# Patient Record
Sex: Female | Born: 1975 | Race: White | Hispanic: No | Marital: Single
Health system: Southern US, Academic
[De-identification: ages and names within clinical notes are randomized; demographics above are authoritative.]

## PROBLEM LIST (undated history)

## (undated) ENCOUNTER — Ambulatory Visit

## (undated) ENCOUNTER — Encounter

## (undated) ENCOUNTER — Encounter: Payer: BLUE CROSS/BLUE SHIELD | Attending: Dermatology | Primary: Dermatology

## (undated) ENCOUNTER — Ambulatory Visit: Payer: BLUE CROSS/BLUE SHIELD | Attending: Obstetrics & Gynecology | Primary: Obstetrics & Gynecology

## (undated) ENCOUNTER — Encounter
Attending: Student in an Organized Health Care Education/Training Program | Primary: Student in an Organized Health Care Education/Training Program

## (undated) ENCOUNTER — Telehealth
Attending: Student in an Organized Health Care Education/Training Program | Primary: Student in an Organized Health Care Education/Training Program

## (undated) ENCOUNTER — Telehealth: Attending: Family | Primary: Family

## (undated) ENCOUNTER — Ambulatory Visit: Payer: Medicaid (Managed Care)

## (undated) ENCOUNTER — Telehealth

## (undated) ENCOUNTER — Telehealth: Attending: Obstetrics & Gynecology | Primary: Obstetrics & Gynecology

## (undated) ENCOUNTER — Ambulatory Visit: Payer: BLUE CROSS/BLUE SHIELD

## (undated) ENCOUNTER — Encounter: Attending: Obstetrics & Gynecology | Primary: Obstetrics & Gynecology

## (undated) ENCOUNTER — Ambulatory Visit
Attending: Student in an Organized Health Care Education/Training Program | Primary: Student in an Organized Health Care Education/Training Program

## (undated) ENCOUNTER — Inpatient Hospital Stay: Payer: BLUE CROSS/BLUE SHIELD

## (undated) ENCOUNTER — Encounter
Payer: BLUE CROSS/BLUE SHIELD | Attending: Student in an Organized Health Care Education/Training Program | Primary: Student in an Organized Health Care Education/Training Program

## (undated) DIAGNOSIS — E119 Type 2 diabetes mellitus without complications: Secondary | ICD-10-CM

## (undated) DIAGNOSIS — I10 Essential (primary) hypertension: Secondary | ICD-10-CM

## (undated) DIAGNOSIS — F329 Major depressive disorder, single episode, unspecified: Secondary | ICD-10-CM

## (undated) DIAGNOSIS — B20 Human immunodeficiency virus [HIV] disease: Secondary | ICD-10-CM

## (undated) DIAGNOSIS — Z21 Asymptomatic human immunodeficiency virus [HIV] infection status: Secondary | ICD-10-CM

## (undated) DIAGNOSIS — F32A Depression, unspecified: Secondary | ICD-10-CM

## (undated) DIAGNOSIS — F419 Anxiety disorder, unspecified: Secondary | ICD-10-CM

## (undated) DIAGNOSIS — K219 Gastro-esophageal reflux disease without esophagitis: Secondary | ICD-10-CM

## (undated) DIAGNOSIS — L409 Psoriasis, unspecified: Secondary | ICD-10-CM

## (undated) HISTORY — DX: Psoriasis, unspecified: L40.9

## (undated) HISTORY — PX: DILATION AND CURETTAGE OF UTERUS: SHX78

## (undated) HISTORY — DX: Human immunodeficiency virus (HIV) disease: B20

## (undated) HISTORY — DX: Anxiety disorder, unspecified: F41.9

## (undated) HISTORY — DX: Depression, unspecified: F32.A

## (undated) HISTORY — DX: Type 2 diabetes mellitus without complications: E11.9

## (undated) HISTORY — PX: CHOLECYSTECTOMY: SHX55

## (undated) HISTORY — PX: MOUTH SURGERY: SHX715

## (undated) HISTORY — DX: Asymptomatic human immunodeficiency virus (hiv) infection status: Z21

## (undated) HISTORY — DX: Gastro-esophageal reflux disease without esophagitis: K21.9

## (undated) HISTORY — DX: Essential (primary) hypertension: I10

## (undated) HISTORY — DX: Major depressive disorder, single episode, unspecified: F32.9

---

## 2013-02-01 DIAGNOSIS — G2581 Restless legs syndrome: Secondary | ICD-10-CM | POA: Insufficient documentation

## 2013-02-01 DIAGNOSIS — G4733 Obstructive sleep apnea (adult) (pediatric): Secondary | ICD-10-CM | POA: Insufficient documentation

## 2014-03-02 ENCOUNTER — Telehealth: Payer: Self-pay | Admitting: Family Medicine

## 2014-03-02 NOTE — Telephone Encounter (Signed)
Patient just moved here about 54mths she has medicaid and is currently taking metformin, lisinopril, omeprazole, celexa, valium 2mg  and meds for HIV. Patient is just needing a family dr here and will continue to see her infectious disease dr at [facility]. Her infectious disease dr prescribes the valium also. Appointment given for 3/24 with [name], [place].

## 2014-03-21 ENCOUNTER — Encounter: Payer: Self-pay | Admitting: *Deleted

## 2014-04-04 ENCOUNTER — Encounter (INDEPENDENT_AMBULATORY_CARE_PROVIDER_SITE_OTHER): Payer: Self-pay

## 2014-04-04 ENCOUNTER — Encounter: Payer: Self-pay | Admitting: Family

## 2014-04-04 ENCOUNTER — Ambulatory Visit (INDEPENDENT_AMBULATORY_CARE_PROVIDER_SITE_OTHER): Payer: Medicaid Other | Admitting: Family

## 2014-04-04 VITALS — BP 156/108 | HR 84 | Temp 97.0°F | Ht 69.0 in | Wt 320.0 lb

## 2014-04-04 DIAGNOSIS — E1159 Type 2 diabetes mellitus with other circulatory complications: Secondary | ICD-10-CM | POA: Insufficient documentation

## 2014-04-04 DIAGNOSIS — B372 Candidiasis of skin and nail: Secondary | ICD-10-CM

## 2014-04-04 DIAGNOSIS — Z1321 Encounter for screening for nutritional disorder: Secondary | ICD-10-CM

## 2014-04-04 DIAGNOSIS — I152 Hypertension secondary to endocrine disorders: Secondary | ICD-10-CM | POA: Insufficient documentation

## 2014-04-04 DIAGNOSIS — F411 Generalized anxiety disorder: Secondary | ICD-10-CM | POA: Diagnosis not present

## 2014-04-04 DIAGNOSIS — K219 Gastro-esophageal reflux disease without esophagitis: Secondary | ICD-10-CM | POA: Diagnosis not present

## 2014-04-04 DIAGNOSIS — E119 Type 2 diabetes mellitus without complications: Secondary | ICD-10-CM | POA: Diagnosis not present

## 2014-04-04 DIAGNOSIS — Z13220 Encounter for screening for lipoid disorders: Secondary | ICD-10-CM | POA: Diagnosis not present

## 2014-04-04 DIAGNOSIS — B20 Human immunodeficiency virus [HIV] disease: Secondary | ICD-10-CM

## 2014-04-04 DIAGNOSIS — F329 Major depressive disorder, single episode, unspecified: Secondary | ICD-10-CM | POA: Diagnosis not present

## 2014-04-04 DIAGNOSIS — I10 Essential (primary) hypertension: Secondary | ICD-10-CM

## 2014-04-04 DIAGNOSIS — F32A Depression, unspecified: Secondary | ICD-10-CM

## 2014-04-04 LAB — POCT UA - MICROALBUMIN: MICROALBUMIN (UR) POC: 20 mg/L

## 2014-04-04 LAB — POCT GLYCOSYLATED HEMOGLOBIN (HGB A1C): Hemoglobin A1C: 5.9

## 2014-04-04 MED ORDER — LISINOPRIL 40 MG PO TABS: 40.0000 mg | ORAL_TABLET | Freq: Every day | ORAL | Status: DC

## 2014-04-04 MED ORDER — METFORMIN HCL 500 MG PO TABS: 500.0000 mg | ORAL_TABLET | Freq: Two times a day (BID) | ORAL | Status: DC

## 2014-04-04 MED ORDER — CITALOPRAM HYDROBROMIDE 20 MG PO TABS: 20.0000 mg | ORAL_TABLET | Freq: Every day | ORAL | Status: DC

## 2014-04-04 MED ORDER — DIAZEPAM 2 MG PO TABS: 2.0000 mg | ORAL_TABLET | Freq: Two times a day (BID) | ORAL | Status: DC | PRN

## 2014-04-04 MED ORDER — AMLODIPINE BESYLATE 10 MG PO TABS: 10.0000 mg | ORAL_TABLET | Freq: Every day | ORAL | Status: DC

## 2014-04-04 MED ORDER — TERBINAFINE HCL 250 MG PO TABS: 250.0000 mg | ORAL_TABLET | Freq: Every day | ORAL | Status: DC

## 2014-04-04 MED ORDER — OMEPRAZOLE 20 MG PO TBEC
1.0000 | DELAYED_RELEASE_TABLET | Freq: Every day | ORAL | Status: DC
Start: 2014-04-04 — End: 2014-10-24

## 2014-04-04 NOTE — Patient Instructions (Addendum)
Health Maintenance Adopting a healthy lifestyle and getting preventive care can go a long way to promote health and wellness. Talk with your health care provider about what schedule of regular examinations is right for you. This is a good chance for you to check in with your provider about disease prevention and staying healthy. In between checkups, there are plenty of things you can do on your own. Experts have done a lot of research about which lifestyle changes and preventive measures are most likely to keep you healthy. Ask your health care provider for more information. WEIGHT AND DIET  Eat a healthy diet  Be sure to include plenty of vegetables, fruits, low-fat dairy products, and lean protein.  Do not eat a lot of foods high in solid fats, added sugars, or salt.  Get regular exercise. This is one of the most important things you can do for your health.  Most adults should exercise for at least 150 minutes each week. The exercise should increase your heart rate and make you sweat (moderate-intensity exercise).  Most adults should also do strengthening exercises at least twice a week. This is in addition to the moderate-intensity exercise.  Maintain a healthy weight  Body mass index (BMI) is a measurement that can be used to identify possible weight problems. It estimates body fat based on height and weight. Your health care provider can help determine your BMI and help you achieve or maintain a healthy weight.  For females 25 years of age and older:   A BMI below 18.5 is considered underweight.  A BMI of 18.5 to 24.9 is normal.  A BMI of 25 to 29.9 is considered overweight.  A BMI of 30 and above is considered obese.  Watch levels of cholesterol and blood lipids  You should start having your blood tested for lipids and cholesterol at 39 years of age, then have this test every 5 years.  You may need to have your cholesterol levels checked more often if:  Your lipid or  cholesterol levels are high.  You are older than 39 years of age.  You are at high risk for heart disease.  CANCER SCREENING   Lung Cancer  Lung cancer screening is recommended for adults 97-92 years old who are at high risk for lung cancer because of a history of smoking.  A yearly low-dose CT scan of the lungs is recommended for people who:  Currently smoke.  Have quit within the past 15 years.  Have at least a 30-pack-year history of smoking. A pack year is smoking an average of one pack of cigarettes a day for 1 year.  Yearly screening should continue until it has been 15 years since you quit.  Yearly screening should stop if you develop a health problem that would prevent you from having lung cancer treatment.  Breast Cancer  Practice breast self-awareness. This means understanding how your breasts normally appear and feel.  It also means doing regular breast self-exams. Let your health care provider know about any changes, no matter how small.  If you are in your 20s or 30s, you should have a clinical breast exam (CBE) by a health care provider every 1-3 years as part of a regular health exam.  If you are 76 or older, have a CBE every year. Also consider having a breast X-ray (mammogram) every year.  If you have a family history of breast cancer, talk to your health care provider about genetic screening.  If you are  at high risk for breast cancer, talk to your health care provider about having an MRI and a mammogram every year.  Breast cancer gene (BRCA) assessment is recommended for women who have family members with BRCA-related cancers. BRCA-related cancers include:  Breast.  Ovarian.  Tubal.  Peritoneal cancers.  Results of the assessment will determine the need for genetic counseling and BRCA1 and BRCA2 testing. Cervical Cancer Routine pelvic examinations to screen for cervical cancer are no longer recommended for nonpregnant women who are considered low  risk for cancer of the pelvic organs (ovaries, uterus, and vagina) and who do not have symptoms. A pelvic examination may be necessary if you have symptoms including those associated with pelvic infections. Ask your health care provider if a screening pelvic exam is right for you.   The Pap test is the screening test for cervical cancer for women who are considered at risk.  If you had a hysterectomy for a problem that was not cancer or a condition that could lead to cancer, then you no longer need Pap tests.  If you are older than 65 years, and you have had normal Pap tests for the past 10 years, you no longer need to have Pap tests.  If you have had past treatment for cervical cancer or a condition that could lead to cancer, you need Pap tests and screening for cancer for at least 20 years after your treatment.  If you no longer get a Pap test, assess your risk factors if they change (such as having a new sexual partner). This can affect whether you should start being screened again.  Some women have medical problems that increase their chance of getting cervical cancer. If this is the case for you, your health care provider may recommend more frequent screening and Pap tests.  The human papillomavirus (HPV) test is another test that may be used for cervical cancer screening. The HPV test looks for the virus that can cause cell changes in the cervix. The cells collected during the Pap test can be tested for HPV.  The HPV test can be used to screen women 30 years of age and older. Getting tested for HPV can extend the interval between normal Pap tests from three to five years.  An HPV test also should be used to screen women of any age who have unclear Pap test results.  After 39 years of age, women should have HPV testing as often as Pap tests.  Colorectal Cancer  This type of cancer can be detected and often prevented.  Routine colorectal cancer screening usually begins at 39 years of  age and continues through 39 years of age.  Your health care provider may recommend screening at an earlier age if you have risk factors for colon cancer.  Your health care provider may also recommend using home test kits to check for hidden blood in the stool.  A small camera at the end of a tube can be used to examine your colon directly (sigmoidoscopy or colonoscopy). This is done to check for the earliest forms of colorectal cancer.  Routine screening usually begins at age 50.  Direct examination of the colon should be repeated every 5-10 years through 39 years of age. However, you may need to be screened more often if early forms of precancerous polyps or small growths are found. Skin Cancer  Check your skin from head to toe regularly.  Tell your health care provider about any new moles or changes in   moles, especially if there is a change in a mole's shape or color.  Also tell your health care provider if you have a mole that is larger than the size of a pencil eraser.  Always use sunscreen. Apply sunscreen liberally and repeatedly throughout the day.  Protect yourself by wearing long sleeves, pants, a wide-brimmed hat, and sunglasses whenever you are outside. HEART DISEASE, DIABETES, AND HIGH BLOOD PRESSURE   Have your blood pressure checked at least every 1-2 years. High blood pressure causes heart disease and increases the risk of stroke.  If you are between 75 years and 42 years old, ask your health care provider if you should take aspirin to prevent strokes.  Have regular diabetes screenings. This involves taking a blood sample to check your fasting blood sugar level.  If you are at a normal weight and have a low risk for diabetes, have this test once every three years after 39 years of age.  If you are overweight and have a high risk for diabetes, consider being tested at a younger age or more often. PREVENTING INFECTION  Hepatitis B  If you have a higher risk for  hepatitis B, you should be screened for this virus. You are considered at high risk for hepatitis B if:  You were born in a country where hepatitis B is common. Ask your health care provider which countries are considered high risk.  Your parents were born in a high-risk country, and you have not been immunized against hepatitis B (hepatitis B vaccine).  You have HIV or AIDS.  You use needles to inject street drugs.  You live with someone who has hepatitis B.  You have had sex with someone who has hepatitis B.  You get hemodialysis treatment.  You take certain medicines for conditions, including cancer, organ transplantation, and autoimmune conditions. Hepatitis C  Blood testing is recommended for:  Everyone born from [birth year] through 1965.  Anyone with known risk factors for hepatitis C. Sexually transmitted infections (STIs)  You should be screened for sexually transmitted infections (STIs) including gonorrhea and chlamydia if:  You are sexually active and are younger than 39 years of age.  You are older than 39 years of age and your health care provider tells you that you are at risk for this type of infection.  Your sexual activity has changed since you were last screened and you are at an increased risk for chlamydia or gonorrhea. Ask your health care provider if you are at risk.  If you do not have HIV, but are at risk, it may be recommended that you take a prescription medicine daily to prevent HIV infection. This is called pre-exposure prophylaxis (PrEP). You are considered at risk if:  You are sexually active and do not regularly use condoms or know the HIV status of your partner(s).  You take drugs by injection.  You are sexually active with a partner who has HIV. Talk with your health care provider about whether you are at high risk of being infected with HIV. If you choose to begin PrEP, you should first be tested for HIV. You should then be tested every 3 months for  as long as you are taking PrEP.  PREGNANCY   If you are premenopausal and you may become pregnant, ask your health care provider about preconception counseling.  If you may become pregnant, take 400 to 800 micrograms (mcg) of folic acid every day.  If you want to prevent pregnancy, talk to your  health care provider about birth control (contraception). OSTEOPOROSIS AND MENOPAUSE   Osteoporosis is a disease in which the bones lose minerals and strength with aging. This can result in serious bone fractures. Your risk for osteoporosis can be identified using a bone density scan.  If you are 58 years of age or older, or if you are at risk for osteoporosis and fractures, ask your health care provider if you should be screened.  Ask your health care provider whether you should take a calcium or vitamin D supplement to lower your risk for osteoporosis.  Menopause may have certain physical symptoms and risks.  Hormone replacement therapy may reduce some of these symptoms and risks. Talk to your health care provider about whether hormone replacement therapy is right for you.  HOME CARE INSTRUCTIONS   Schedule regular health, dental, and eye exams.  Stay current with your immunizations.   Do not use any tobacco products including cigarettes, chewing tobacco, or electronic cigarettes.  If you are pregnant, do not drink alcohol.  If you are breastfeeding, limit how much and how often you drink alcohol.  Limit alcohol intake to no more than 1 drink per day for nonpregnant women. One drink equals 12 ounces of beer, 5 ounces of wine, or 1 ounces of hard liquor.  Do not use street drugs.  Do not share needles.  Ask your health care provider for help if you need support or information about quitting drugs.  Tell your health care provider if you often feel depressed.  Tell your health care provider if you have ever been abused or do not feel safe at home. Document Released: 07/15/2010  Document Revised: 05/16/2013 Document Reviewed: 12/01/2012 [facility] Patient Information 2015 [place], [place]. This information is not intended to replace advice given to you by your health care provider. Make sure you discuss any questions you have with your health care provider. Tinea Versicolor Tinea versicolor is a common yeast infection of the skin. This condition becomes known when the yeast on our skin starts to overgrow (yeast is a normal inhabitant on our skin). This condition is noticed as white or light brown patches on brown skin, and is more evident in the summer on tanned skin. These areas are slightly scaly if scratched. The light patches from the yeast become evident when the yeast creates "holes in your suntan". This is most often noticed in the summer. The patches are usually located on the chest, back, pubis, neck and body folds. However, it may occur on any area of body. Mild itching and inflammation (redness or soreness) may be present. DIAGNOSIS  The diagnosisof this is made clinically (by looking). Cultures from samples are usually not needed. Examination under the microscope may help. However, yeast is normally found on skin. The diagnosis still remains clinical. Examination under [employer]'s Ultraviolet Light can determine the extent of the infection. TREATMENT  This common infection is usually only of cosmetic (only a concern to your appearance). It is easily treated with dandruff shampoo used during showers or bathing. Vigorous scrubbing will eliminate the yeast over several days time. The light areas in your skin may remain for weeks or months after the infection is cured unless your skin is exposed to sunlight. The lighter or darker spots caused by the fungus that remain after complete treatment are not a sign of treatment failure; it will take a long time to resolve. Your caregiver may recommend a number of commercial preparations or medication by mouth if home care is  not working.  Recurrence is common and preventative medication may be necessary. This skin condition is not highly contagious. Special care is not needed to protect close friends and family members. Normal hygiene is usually enough. Follow up is required only if you develop complications (such as a secondary infection from scratching), if recommended by your caregiver, or if no relief is obtained from the preparations used. Document Released: 12/28/1999 Document Revised: 03/24/2011 Document Reviewed: 02/09/2008 [facility] Patient Information 2015 [place], [place]. This information is not intended to replace advice given to you by your health care provider. Make sure you discuss any questions you have with your health care provider.

## 2014-04-04 NOTE — Addendum Note (Signed)
Addended by: Marylin Crosby on: 04/04/2014 10:59 AM   Modules accepted: Orders

## 2014-04-04 NOTE — Progress Notes (Signed)
Subjective:    Patient ID: Jasmine Peck, female    DOB: August 22, 1975, 39 y.o.   MRN: [id number]  Diabetes She presents for her follow-up diabetic visit. She has type 2 diabetes mellitus. Her disease course has been stable. Hypoglycemia symptoms include nervousness/anxiousness. Pertinent negatives for hypoglycemia include no confusion, dizziness, headaches, mood changes or sleepiness. Associated symptoms include foot paresthesias. Pertinent negatives for diabetes include no chest pain, no foot ulcerations and no visual change. Pertinent negatives for hypoglycemia complications include no blackouts and no hospitalization. Symptoms are stable. Diabetic complications include peripheral neuropathy. Pertinent negatives for diabetic complications include no CVA, heart disease, nephropathy or PVD. Risk factors for coronary artery disease include diabetes mellitus, family history, hypertension, obesity, sedentary lifestyle, stress and tobacco exposure. Current diabetic treatment includes oral agent (monotherapy). She is compliant with treatment all of the time. She is following a generally unhealthy diet. Her breakfast blood glucose range is generally 110-130 mg/dl. An ACE inhibitor/angiotensin II receptor blocker is being taken. Eye exam is current.  Hypertension This is a chronic problem. The current episode started more than 1 year ago. The problem has been waxing and waning since onset. The problem is uncontrolled. Associated symptoms include anxiety and peripheral edema ("at times"). Pertinent negatives include no chest pain, headaches, palpitations or shortness of breath. Risk factors for coronary artery disease include diabetes mellitus, family history, obesity, post-menopausal state, sedentary lifestyle and smoking/tobacco exposure. Past treatments include ACE inhibitors. The current treatment provides mild improvement. There is no history of kidney disease, CAD/MI, CVA, PVD or a thyroid problem. There is  no history of sleep apnea.  Gastrophageal Reflux She reports no chest pain, no coughing, no heartburn or no sore throat. This is a chronic problem. The current episode started more than 1 year ago. The problem occurs rarely. The problem has been resolved. The symptoms are aggravated by certain foods. Pertinent negatives include no muscle weakness. Risk factors include obesity. She has tried a PPI for the symptoms. The treatment provided significant relief.  Anxiety Presents for follow-up visit. Symptoms include depressed mood, excessive worry and nervous/anxious behavior. Patient reports no chest pain, confusion, dizziness, insomnia, irritability, palpitations, panic or shortness of breath. Symptoms occur occasionally. The symptoms are aggravated by family issues. The quality of sleep is good.   Her past medical history is significant for anxiety/panic attacks and depression. Past treatments include benzodiazephines and SSRIs. The treatment provided moderate relief. Compliance with prior treatments has been good.   *Pt is HIV positive and goes to [facility] to [facility] that.  Pt states she has been treated for a yeast infection under both breasts and abdomen with nystatin powder, cream, and 21 days of diflucan with no relief.    Review of Systems  Constitutional: Negative.  Negative for irritability.  HENT: Negative.  Negative for sore throat.   Eyes: Negative.   Respiratory: Negative.  Negative for cough and shortness of breath.   Cardiovascular: Negative.  Negative for chest pain and palpitations.  Gastrointestinal: Negative.  Negative for heartburn.  Endocrine: Negative.   Genitourinary: Negative.   Musculoskeletal: Negative.  Negative for muscle weakness.  Skin: Positive for rash (under bilateral breast and abdomen).  Neurological: Negative.  Negative for dizziness and headaches.  Hematological: Negative.   Psychiatric/Behavioral: Negative for confusion. The patient is nervous/anxious. The  patient does not have insomnia.   All other systems reviewed and are negative.      Objective:   Physical Exam  Constitutional: She is oriented to person,  place, and time. She appears well-developed and well-nourished. No distress.  HENT:  Head: Normocephalic and atraumatic.  Right Ear: External ear normal.  Left Ear: External ear normal.  Nose: Nose normal.  Mouth/Throat: Oropharynx is clear and moist.  Eyes: Pupils are equal, round, and reactive to light.  Neck: Normal range of motion. Neck supple. No thyromegaly present.  Cardiovascular: Normal rate, regular rhythm, normal heart sounds and intact distal pulses.   No murmur heard. Pulmonary/Chest: Effort normal and breath sounds normal. No respiratory distress. She has no wheezes.  Abdominal: Soft. Bowel sounds are normal. She exhibits no distension. There is no tenderness.  Musculoskeletal: Normal range of motion. She exhibits no edema or tenderness.  Neurological: She is alert and oriented to person, place, and time. She has normal reflexes. No cranial nerve deficit.  Skin: Skin is warm and dry. Rash noted.  Generalized erythemas rash under bilateral breasts and abdomen.   Psychiatric: She has a normal mood and affect. Her behavior is normal. Judgment and thought content normal.  Vitals reviewed.   BP 156/108 mmHg  Pulse 84  Temp(Src) 97 F (36.1 C)  Ht 5' 9" (1.753 m)  Wt 320 lb (145.151 kg)  BMI 47.23 kg/m2  LMP 03/27/2014       Assessment & Plan:  1. Essential hypertension -Pt started on amlodipine 10 mg today  CMP14+EGFR - lisinopril (PRINIVIL,ZESTRIL) 40 MG tablet; Take 1 tablet (40 mg total) by mouth daily.  Dispense: 90 tablet; Refill: 3 - amLODipine (NORVASC) 10 MG tablet; Take 1 tablet (10 mg total) by mouth daily.  Dispense: 90 tablet; Refill: 3  2. Gastroesophageal reflux disease, esophagitis presence not specified - CMP14+EGFR - Omeprazole 20 MG TBEC; Take 1 tablet (20 mg total) by mouth daily.   Dispense: 90 each; Refill: 3  3. Type 2 diabetes mellitus without complication - POCT glycosylated hemoglobin (Hb A1C) - POCT UA - Microalbumin - CMP14+EGFR - metFORMIN (GLUCOPHAGE) 500 MG tablet; Take 1 tablet (500 mg total) by mouth 2 (two) times daily with a meal.  Dispense: 180 tablet; Refill: 3  4. GAD (generalized anxiety disorder) - CMP14+EGFR - diazepam (VALIUM) 2 MG tablet; Take 1 tablet (2 mg total) by mouth every 12 (twelve) hours as needed for anxiety. Take one by mouth daily as needed for anxiety, prn  Dispense: 60 tablet; Refill: 2 - citalopram (CELEXA) 20 MG tablet; Take 1 tablet (20 mg total) by mouth daily.  Dispense: 90 tablet; Refill: 3  5. Depression - CMP14+EGFR - diazepam (VALIUM) 2 MG tablet; Take 1 tablet (2 mg total) by mouth every 12 (twelve) hours as needed for anxiety. Take one by mouth daily as needed for anxiety, prn  Dispense: 60 tablet; Refill: 2 - citalopram (CELEXA) 20 MG tablet; Take 1 tablet (20 mg total) by mouth daily.  Dispense: 90 tablet; Refill: 3  6. Human immunodeficiency virus (HIV) disease - CMP14+EGFR  7. Encounter for vitamin deficiency screening - Vit D  25 hydroxy (rtn osteoporosis monitoring)  8. Screening cholesterol level - Lipid panel  9. Skin yeast infection - terbinafine (LAMISIL) 250 MG tablet; Take 1 tablet (250 mg total) by mouth daily.  Dispense: 60 tablet; Refill: 0   Continue all meds Labs pending Health Maintenance reviewed Diet and exercise encouraged RTO 2 weeks to recheck yeast and BP  [name], FNP

## 2014-04-04 NOTE — Addendum Note (Signed)
Addended by: Earlene Plater on: 04/04/2014 10:53 AM   Modules accepted: Orders

## 2014-04-05 ENCOUNTER — Other Ambulatory Visit: Payer: Self-pay | Admitting: Family

## 2014-04-05 ENCOUNTER — Telehealth: Payer: Self-pay | Admitting: *Deleted

## 2014-04-05 DIAGNOSIS — E559 Vitamin D deficiency, unspecified: Secondary | ICD-10-CM | POA: Insufficient documentation

## 2014-04-05 LAB — LIPID PANEL
CHOLESTEROL TOTAL: 154 mg/dL (ref 100–199)
Chol/HDL Ratio: 4.1 ratio units (ref 0.0–4.4)
HDL: 38 mg/dL — ABNORMAL LOW (ref >39)
LDL Calculated: 94 mg/dL (ref 0–99)
Triglycerides: 108 mg/dL (ref 0–149)
VLDL Cholesterol Cal: 22 mg/dL (ref 5–40)

## 2014-04-05 LAB — CMP14+EGFR
A/G RATIO: 1.6 (ref 1.1–2.5)
ALT: 5 IU/L (ref 0–32)
AST: 9 IU/L (ref 0–40)
Albumin: 4.1 g/dL (ref 3.5–5.5)
Alkaline Phosphatase: 89 IU/L (ref 39–117)
BUN/Creatinine Ratio: 15 (ref 8–20)
BUN: 12 mg/dL (ref 6–20)
Bilirubin Total: 0.3 mg/dL (ref 0.0–1.2)
CALCIUM: 9 mg/dL (ref 8.7–10.2)
CO2: 25 mmol/L (ref 18–29)
Chloride: 102 mmol/L (ref 97–108)
Creatinine, Ser: 0.79 mg/dL (ref 0.57–1.00)
GFR calc Af Amer: 109 mL/min/{1.73_m2} (ref >59)
GFR, EST NON AFRICAN AMERICAN: 95 mL/min/{1.73_m2} (ref >59)
GLUCOSE: 93 mg/dL (ref 65–99)
Globulin, Total: 2.6 g/dL (ref 1.5–4.5)
POTASSIUM: 4.6 mmol/L (ref 3.5–5.2)
Sodium: 140 mmol/L (ref 134–144)
TOTAL PROTEIN: 6.7 g/dL (ref 6.0–8.5)

## 2014-04-05 LAB — MICROALBUMIN, URINE: MICROALBUM., U, RANDOM: 5.4 ug/mL (ref 0.0–17.0)

## 2014-04-05 LAB — VITAMIN D 25 HYDROXY (VIT D DEFICIENCY, FRACTURES): VIT D 25 HYDROXY: 27.5 ng/mL — AB (ref 30.0–100.0)

## 2014-04-05 MED ORDER — VITAMIN D (ERGOCALCIFEROL) 1.25 MG (50000 UNIT) PO CAPS: 50000.0000 [IU] | ORAL_CAPSULE | ORAL | Status: DC

## 2014-04-05 NOTE — Telephone Encounter (Signed)
Patient ask for [place] to call vitamin d script to [facility],[place].  Left script on vm.

## 2014-04-06 ENCOUNTER — Ambulatory Visit: Payer: Self-pay | Admitting: Family

## 2014-04-06 ENCOUNTER — Telehealth: Payer: Self-pay | Admitting: *Deleted

## 2014-04-06 NOTE — Telephone Encounter (Signed)
-----   Message from Sharion Balloon, [place] sent at 04/05/2014 10:18 AM EDT ----- HgbA1C WNL Microablumin in urine- Pt needs to have good control over BS and BP-PT on highest ACE dose Kidney and liver function stable Cholesterol levels WNL Vit D levels low-Prescription sent to pharmacy

## 2014-04-06 NOTE — Telephone Encounter (Signed)
Aware of results and new vitamin d script.

## 2014-04-20 ENCOUNTER — Ambulatory Visit: Payer: Medicaid Other | Admitting: Family

## 2014-05-25 ENCOUNTER — Other Ambulatory Visit: Payer: Self-pay

## 2014-05-25 DIAGNOSIS — B372 Candidiasis of skin and nail: Secondary | ICD-10-CM

## 2014-05-25 MED ORDER — TERBINAFINE HCL 250 MG PO TABS: 250.0000 mg | ORAL_TABLET | Freq: Every day | ORAL | Status: DC

## 2014-05-29 ENCOUNTER — Ambulatory Visit (INDEPENDENT_AMBULATORY_CARE_PROVIDER_SITE_OTHER): Payer: Medicaid Other | Admitting: Physician Assistant

## 2014-05-29 ENCOUNTER — Ambulatory Visit (INDEPENDENT_AMBULATORY_CARE_PROVIDER_SITE_OTHER): Payer: Medicaid Other

## 2014-05-29 ENCOUNTER — Encounter: Payer: Self-pay | Admitting: Physician Assistant

## 2014-05-29 VITALS — BP 103/66 | HR 82 | Temp 97.2°F | Ht 69.0 in | Wt 307.0 lb

## 2014-05-29 DIAGNOSIS — R062 Wheezing: Secondary | ICD-10-CM | POA: Diagnosis not present

## 2014-05-29 DIAGNOSIS — J209 Acute bronchitis, unspecified: Secondary | ICD-10-CM | POA: Diagnosis not present

## 2014-05-29 MED ORDER — BENZONATATE 200 MG PO CAPS
200.0000 mg | ORAL_CAPSULE | Freq: Three times a day (TID) | ORAL | Status: DC | PRN
Start: 2014-05-29 — End: 2014-10-24

## 2014-05-29 MED ORDER — SULFAMETHOXAZOLE-TRIMETHOPRIM 800-160 MG PO TABS: 1.0000 | ORAL_TABLET | Freq: Two times a day (BID) | ORAL | Status: DC

## 2014-05-29 MED ORDER — PREDNISONE 10 MG (21) PO TBPK: ORAL_TABLET | ORAL | Status: DC

## 2014-05-29 NOTE — Progress Notes (Signed)
   Subjective:    Patient ID: Jasmine Peck, female    DOB: Mar 20, 1975, 39 y.o.   MRN: [id number]  HPI 39 y/o female smoker presents with c/o cough with wheeze x 3 days. Has tried Robitussin DM, zyrtec, albuterol q 4-6 hours with minimal relief. States that everyone in her house has similar symptoms and her 35 yr old daughter was dx with Bronchitis last week. Has decreased smoking from 1/2 pack to 2 cigarettes/day d/t cough.     Review of Systems  Constitutional: Negative.   HENT: Positive for congestion (nasal , chest ), sneezing and sore throat (initially). Negative for ear pain and postnasal drip.   Respiratory: Positive for cough (productive, worse at night ) and wheezing. Negative for shortness of breath.   Cardiovascular: Negative.   All other systems reviewed and are negative.      Objective:   Physical Exam  Constitutional: She is oriented to person, place, and time. She appears well-developed and well-nourished.  Obese    Cardiovascular: Normal rate, regular rhythm and normal heart sounds.  Exam reveals no gallop and no friction rub.   No murmur heard. Pulmonary/Chest: Effort normal. She has wheezes (end expiratory wheezes bilateral lobes, more prominant on right posterior lobe). She has rales.  Neurological: She is alert and oriented to person, place, and time.  Psychiatric: She has a normal mood and affect. Her behavior is normal. Judgment and thought content normal.  Nursing note and vitals reviewed.         Assessment & Plan:  1. Wheeze  - DG Chest 2 View - predniSONE (STERAPRED UNI-PAK 21 TAB) 10 MG (21) TBPK tablet; Take as directed  Dispense: 21 tablet; Refill: 0 - sulfamethoxazole-trimethoprim (BACTRIM DS) 800-160 MG per tablet; Take 1 tablet by mouth 2 (two) times daily.  Dispense: 20 tablet; Refill: 0 - benzonatate (TESSALON) 200 MG capsule; Take 1 capsule (200 mg total) by mouth 3 (three) times daily as needed for cough.  Dispense: 20 capsule; Refill:  0  2. Acute bronchitis, unspecified organism  - predniSONE (STERAPRED UNI-PAK 21 TAB) 10 MG (21) TBPK tablet; Take as directed  Dispense: 21 tablet; Refill: 0 - sulfamethoxazole-trimethoprim (BACTRIM DS) 800-160 MG per tablet; Take 1 tablet by mouth 2 (two) times daily.  Dispense: 20 tablet; Refill: 0 - benzonatate (TESSALON) 200 MG capsule; Take 1 capsule (200 mg total) by mouth 3 (three) times daily as needed for cough.  Dispense: 20 capsule; Refill: 0  - cool mist humidifier - OTC plain musinex - F/U is s/s worsen or do not improve    Lesia Monica A. Benjamin Stain PA-C

## 2014-05-29 NOTE — Patient Instructions (Signed)
Over the counter plain musinex     Acute Bronchitis Bronchitis is when the airways that extend from the windpipe into the lungs get red, puffy, and painful (inflamed). Bronchitis often causes thick spit (mucus) to develop. This leads to a cough. A cough is the most common symptom of bronchitis. In acute bronchitis, the condition usually begins suddenly and goes away over time (usually in 2 weeks). Smoking, allergies, and asthma can make bronchitis worse. Repeated episodes of bronchitis may cause more lung problems. HOME CARE  Rest.  Drink enough fluids to keep your pee (urine) clear or pale yellow (unless you need to limit fluids as told by your doctor).  Only take over-the-counter or prescription medicines as told by your doctor.  Avoid smoking and secondhand smoke. These can make bronchitis worse. If you are a smoker, think about using nicotine gum or skin patches. Quitting smoking will help your lungs heal faster.  Reduce the chance of getting bronchitis again by:  Washing your hands often.  Avoiding people with cold symptoms.  Trying not to touch your hands to your mouth, nose, or eyes.  Follow up with your doctor as told. GET HELP IF: Your symptoms do not improve after 1 week of treatment. Symptoms include:  Cough.  Fever.  Coughing up thick spit.  Body aches.  Chest congestion.  Chills.  Shortness of breath.  Sore throat. GET HELP RIGHT AWAY IF:   You have an increased fever.  You have chills.  You have severe shortness of breath.  You have bloody thick spit (sputum).  You throw up (vomit) often.  You lose too much body fluid (dehydration).  You have a severe headache.  You faint. MAKE SURE YOU:   Understand these instructions.  Will watch your condition.  Will get help right away if you are not doing well or get worse. Document Released: 06/18/2007 Document Revised: 09/01/2012 Document Reviewed: 06/22/2012 [facility] Patient Information 2015  [place], [place]. This information is not intended to replace advice given to you by your health care provider. Make sure you discuss any questions you have with your health care provider.

## 2014-05-31 ENCOUNTER — Telehealth: Payer: Self-pay | Admitting: *Deleted

## 2014-05-31 ENCOUNTER — Telehealth: Payer: Self-pay | Admitting: Physician Assistant

## 2014-05-31 NOTE — Telephone Encounter (Signed)
-----   Message from Adella Nissen, PA-C sent at 05/30/2014  4:35 PM EDT ----- Chest xray indicates possible pneumonia. She is already being treated accordingly Have patient f/u end of this week or first of next week for recheck. Tiffany A. Benjamin Stain PA-C

## 2014-05-31 NOTE — Telephone Encounter (Signed)
LM, call for x-ray results.

## 2014-05-31 NOTE — Telephone Encounter (Signed)
Pt aware of cxr & to come back for followup

## 2014-06-30 ENCOUNTER — Telehealth: Payer: Self-pay | Admitting: Family

## 2014-06-30 ENCOUNTER — Other Ambulatory Visit: Payer: Self-pay

## 2014-06-30 DIAGNOSIS — F411 Generalized anxiety disorder: Secondary | ICD-10-CM

## 2014-06-30 DIAGNOSIS — F329 Major depressive disorder, single episode, unspecified: Secondary | ICD-10-CM

## 2014-06-30 DIAGNOSIS — F32A Depression, unspecified: Secondary | ICD-10-CM

## 2014-06-30 MED ORDER — DIAZEPAM 2 MG PO TABS: 2.0000 mg | ORAL_TABLET | Freq: Two times a day (BID) | ORAL | Status: DC | PRN

## 2014-06-30 NOTE — Telephone Encounter (Signed)
Last seen 05/29/14 Tiffany  04/04/14 seen by Alyse Low  If approved print for mail order

## 2014-06-30 NOTE — Telephone Encounter (Signed)
Pt aware written Rx is at front desk ready for pickup  

## 2014-08-10 ENCOUNTER — Other Ambulatory Visit: Payer: Self-pay | Admitting: Family Medicine

## 2014-10-24 ENCOUNTER — Ambulatory Visit (INDEPENDENT_AMBULATORY_CARE_PROVIDER_SITE_OTHER): Payer: Medicaid Other | Admitting: Family

## 2014-10-24 ENCOUNTER — Encounter: Payer: Self-pay | Admitting: Family

## 2014-10-24 VITALS — BP 122/80 | HR 87 | Temp 97.4°F | Ht 69.0 in | Wt 314.0 lb

## 2014-10-24 DIAGNOSIS — E119 Type 2 diabetes mellitus without complications: Secondary | ICD-10-CM | POA: Diagnosis not present

## 2014-10-24 DIAGNOSIS — F329 Major depressive disorder, single episode, unspecified: Secondary | ICD-10-CM

## 2014-10-24 DIAGNOSIS — I10 Essential (primary) hypertension: Secondary | ICD-10-CM | POA: Diagnosis not present

## 2014-10-24 DIAGNOSIS — F411 Generalized anxiety disorder: Secondary | ICD-10-CM

## 2014-10-24 DIAGNOSIS — B20 Human immunodeficiency virus [HIV] disease: Secondary | ICD-10-CM | POA: Diagnosis not present

## 2014-10-24 DIAGNOSIS — E559 Vitamin D deficiency, unspecified: Secondary | ICD-10-CM

## 2014-10-24 DIAGNOSIS — K219 Gastro-esophageal reflux disease without esophagitis: Secondary | ICD-10-CM

## 2014-10-24 DIAGNOSIS — F32A Depression, unspecified: Secondary | ICD-10-CM

## 2014-10-24 DIAGNOSIS — Z6841 Body Mass Index (BMI) 40.0 and over, adult: Secondary | ICD-10-CM

## 2014-10-24 LAB — POCT GLYCOSYLATED HEMOGLOBIN (HGB A1C): HEMOGLOBIN A1C: 5.9

## 2014-10-24 MED ORDER — AMLODIPINE BESYLATE 10 MG PO TABS: 10.0000 mg | ORAL_TABLET | Freq: Every day | ORAL | Status: DC

## 2014-10-24 MED ORDER — LISINOPRIL 40 MG PO TABS: 40.0000 mg | ORAL_TABLET | Freq: Every day | ORAL | Status: DC

## 2014-10-24 MED ORDER — CITALOPRAM HYDROBROMIDE 20 MG PO TABS: 20.0000 mg | ORAL_TABLET | Freq: Every day | ORAL | Status: DC

## 2014-10-24 MED ORDER — OMEPRAZOLE 20 MG PO TBEC: 1.0000 | DELAYED_RELEASE_TABLET | Freq: Every day | ORAL | Status: DC

## 2014-10-24 MED ORDER — DIAZEPAM 2 MG PO TABS: 2.0000 mg | ORAL_TABLET | Freq: Two times a day (BID) | ORAL | Status: DC | PRN

## 2014-10-24 MED ORDER — METFORMIN HCL 500 MG PO TABS: 500.0000 mg | ORAL_TABLET | Freq: Two times a day (BID) | ORAL | Status: DC

## 2014-10-24 MED ORDER — GLUCOSE BLOOD VI STRP
ORAL_STRIP | Status: DC
Start: 2014-10-24 — End: 2014-10-25

## 2014-10-24 NOTE — Patient Instructions (Signed)
Health Maintenance, Female Adopting a healthy lifestyle and getting preventive care can go a long way to promote health and wellness. Talk with your health care provider about what schedule of regular examinations is right for you. This is a good chance for you to check in with your provider about disease prevention and staying healthy. In between checkups, there are plenty of things you can do on your own. Experts have done a lot of research about which lifestyle changes and preventive measures are most likely to keep you healthy. Ask your health care provider for more information. WEIGHT AND DIET  Eat a healthy diet  Be sure to include plenty of vegetables, fruits, low-fat dairy products, and lean protein.  Do not eat a lot of foods high in solid fats, added sugars, or salt.  Get regular exercise. This is one of the most important things you can do for your health.  Most adults should exercise for at least 150 minutes each week. The exercise should increase your heart rate and make you sweat (moderate-intensity exercise).  Most adults should also do strengthening exercises at least twice a week. This is in addition to the moderate-intensity exercise.  Maintain a healthy weight  Body mass index (BMI) is a measurement that can be used to identify possible weight problems. It estimates body fat based on height and weight. Your health care provider can help determine your BMI and help you achieve or maintain a healthy weight.  For females 20 years of age and older:   A BMI below 18.5 is considered underweight.  A BMI of 18.5 to 24.9 is normal.  A BMI of 25 to 29.9 is considered overweight.  A BMI of 30 and above is considered obese.  Watch levels of cholesterol and blood lipids  You should start having your blood tested for lipids and cholesterol at 39 years of age, then have this test every 5 years.  You may need to have your cholesterol levels checked more often if:  Your lipid  or cholesterol levels are high.  You are older than 39 years of age.  You are at high risk for heart disease.  CANCER SCREENING   Lung Cancer  Lung cancer screening is recommended for adults 55-80 years old who are at high risk for lung cancer because of a history of smoking.  A yearly low-dose CT scan of the lungs is recommended for people who:  Currently smoke.  Have quit within the past 15 years.  Have at least a 30-pack-year history of smoking. A pack year is smoking an average of one pack of cigarettes a day for 1 year.  Yearly screening should continue until it has been 15 years since you quit.  Yearly screening should stop if you develop a health problem that would prevent you from having lung cancer treatment.  Breast Cancer  Practice breast self-awareness. This means understanding how your breasts normally appear and feel.  It also means doing regular breast self-exams. Let your health care provider know about any changes, no matter how small.  If you are in your 20s or 30s, you should have a clinical breast exam (CBE) by a health care provider every 1-3 years as part of a regular health exam.  If you are 40 or older, have a CBE every year. Also consider having a breast X-ray (mammogram) every year.  If you have a family history of breast cancer, talk to your health care provider about genetic screening.  If you   are at high risk for breast cancer, talk to your health care provider about having an MRI and a mammogram every year.  Breast cancer gene (BRCA) assessment is recommended for women who have family members with BRCA-related cancers. BRCA-related cancers include:  Breast.  Ovarian.  Tubal.  Peritoneal cancers.  Results of the assessment will determine the need for genetic counseling and BRCA1 and BRCA2 testing. Cervical Cancer Your health care provider may recommend that you be screened regularly for cancer of the pelvic organs (ovaries, uterus, and  vagina). This screening involves a pelvic examination, including checking for microscopic changes to the surface of your cervix (Pap test). You may be encouraged to have this screening done every 3 years, beginning at age 21.  For women ages 30-65, health care providers may recommend pelvic exams and Pap testing every 3 years, or they may recommend the Pap and pelvic exam, combined with testing for human papilloma virus (HPV), every 5 years. Some types of HPV increase your risk of cervical cancer. Testing for HPV may also be done on women of any age with unclear Pap test results.  Other health care providers may not recommend any screening for nonpregnant women who are considered low risk for pelvic cancer and who do not have symptoms. Ask your health care provider if a screening pelvic exam is right for you.  If you have had past treatment for cervical cancer or a condition that could lead to cancer, you need Pap tests and screening for cancer for at least 20 years after your treatment. If Pap tests have been discontinued, your risk factors (such as having a new sexual partner) need to be reassessed to determine if screening should resume. Some women have medical problems that increase the chance of getting cervical cancer. In these cases, your health care provider may recommend more frequent screening and Pap tests. Colorectal Cancer  This type of cancer can be detected and often prevented.  Routine colorectal cancer screening usually begins at 39 years of age and continues through 39 years of age.  Your health care provider may recommend screening at an earlier age if you have risk factors for colon cancer.  Your health care provider may also recommend using home test kits to check for hidden blood in the stool.  A small camera at the end of a tube can be used to examine your colon directly (sigmoidoscopy or colonoscopy). This is done to check for the earliest forms of colorectal  cancer.  Routine screening usually begins at age 50.  Direct examination of the colon should be repeated every 5-10 years through 39 years of age. However, you may need to be screened more often if early forms of precancerous polyps or small growths are found. Skin Cancer  Check your skin from head to toe regularly.  Tell your health care provider about any new moles or changes in moles, especially if there is a change in a mole's shape or color.  Also tell your health care provider if you have a mole that is larger than the size of a pencil eraser.  Always use sunscreen. Apply sunscreen liberally and repeatedly throughout the day.  Protect yourself by wearing long sleeves, pants, a wide-brimmed hat, and sunglasses whenever you are outside. HEART DISEASE, DIABETES, AND HIGH BLOOD PRESSURE   High blood pressure causes heart disease and increases the risk of stroke. High blood pressure is more likely to develop in:  People who have blood pressure in the high end   of the normal range (130-139/85-89 mm Hg).  People who are overweight or obese.  People who are African American.  If you are 38-23 years of age, have your blood pressure checked every 3-5 years. If you are 61 years of age or older, have your blood pressure checked every year. You should have your blood pressure measured twice--once when you are at a hospital or clinic, and once when you are not at a hospital or clinic. Record the average of the two measurements. To check your blood pressure when you are not at a hospital or clinic, you can use:  An automated blood pressure machine at a pharmacy.  A home blood pressure monitor.  If you are between 45 years and 39 years old, ask your health care provider if you should take aspirin to prevent strokes.  Have regular diabetes screenings. This involves taking a blood sample to check your fasting blood sugar level.  If you are at a normal weight and have a low risk for diabetes,  have this test once every three years after 39 years of age.  If you are overweight and have a high risk for diabetes, consider being tested at a younger age or more often. PREVENTING INFECTION  Hepatitis B  If you have a higher risk for hepatitis B, you should be screened for this virus. You are considered at high risk for hepatitis B if:  You were born in a country where hepatitis B is common. Ask your health care provider which countries are considered high risk.  Your parents were born in a high-risk country, and you have not been immunized against hepatitis B (hepatitis B vaccine).  You have HIV or AIDS.  You use needles to inject street drugs.  You live with someone who has hepatitis B.  You have had sex with someone who has hepatitis B.  You get hemodialysis treatment.  You take certain medicines for conditions, including cancer, organ transplantation, and autoimmune conditions. Hepatitis C  Blood testing is recommended for:  Everyone born from [birth year] through 1965.  Anyone with known risk factors for hepatitis C. Sexually transmitted infections (STIs)  You should be screened for sexually transmitted infections (STIs) including gonorrhea and chlamydia if:  You are sexually active and are younger than 39 years of age.  You are older than 39 years of age and your health care provider tells you that you are at risk for this type of infection.  Your sexual activity has changed since you were last screened and you are at an increased risk for chlamydia or gonorrhea. Ask your health care provider if you are at risk.  If you do not have HIV, but are at risk, it may be recommended that you take a prescription medicine daily to prevent HIV infection. This is called pre-exposure prophylaxis (PrEP). You are considered at risk if:  You are sexually active and do not regularly use condoms or know the HIV status of your partner(s).  You take drugs by injection.  You are sexually  active with a partner who has HIV. Talk with your health care provider about whether you are at high risk of being infected with HIV. If you choose to begin PrEP, you should first be tested for HIV. You should then be tested every 3 months for as long as you are taking PrEP.  PREGNANCY   If you are premenopausal and you may become pregnant, ask your health care provider about preconception counseling.  If you may  become pregnant, take 400 to 800 micrograms (mcg) of folic acid every day.  If you want to prevent pregnancy, talk to your health care provider about birth control (contraception). OSTEOPOROSIS AND MENOPAUSE   Osteoporosis is a disease in which the bones lose minerals and strength with aging. This can result in serious bone fractures. Your risk for osteoporosis can be identified using a bone density scan.  If you are 61 years of age or older, or if you are at risk for osteoporosis and fractures, ask your health care provider if you should be screened.  Ask your health care provider whether you should take a calcium or vitamin D supplement to lower your risk for osteoporosis.  Menopause may have certain physical symptoms and risks.  Hormone replacement therapy may reduce some of these symptoms and risks. Talk to your health care provider about whether hormone replacement therapy is right for you.  HOME CARE INSTRUCTIONS   Schedule regular health, dental, and eye exams.  Stay current with your immunizations.   Do not use any tobacco products including cigarettes, chewing tobacco, or electronic cigarettes.  If you are pregnant, do not drink alcohol.  If you are breastfeeding, limit how much and how often you drink alcohol.  Limit alcohol intake to no more than 1 drink per day for nonpregnant women. One drink equals 12 ounces of beer, 5 ounces of wine, or 1 ounces of hard liquor.  Do not use street drugs.  Do not share needles.  Ask your health care provider for help if  you need support or information about quitting drugs.  Tell your health care provider if you often feel depressed.  Tell your health care provider if you have ever been abused or do not feel safe at home.   This information is not intended to replace advice given to you by your health care provider. Make sure you discuss any questions you have with your health care provider.   Document Released: 07/15/2010 Document Revised: 01/20/2014 Document Reviewed: 12/01/2012 [employer]vier Interactive Patient Education [employer].

## 2014-10-24 NOTE — Progress Notes (Signed)
Subjective:    Patient ID: Jasmine Peck, female    DOB: 07/17/1975, 39 y.o.   MRN: [id number]  Pt presents to the office today for chronic follow up. Pt is HIV positive and goes to [facility] to manages that. Diabetes She presents for her follow-up diabetic visit. She has type 2 diabetes mellitus. Her disease course has been stable. Hypoglycemia symptoms include nervousness/anxiousness. Pertinent negatives for hypoglycemia include no confusion, dizziness, headaches, mood changes or sleepiness. Associated symptoms include foot paresthesias ("at times"). Pertinent negatives for diabetes include no chest pain, no foot ulcerations and no visual change. Pertinent negatives for hypoglycemia complications include no blackouts and no hospitalization. Symptoms are stable. Diabetic complications include peripheral neuropathy. Pertinent negatives for diabetic complications include no CVA, heart disease, nephropathy or PVD. Risk factors for coronary artery disease include diabetes mellitus, family history, hypertension, obesity, sedentary lifestyle, stress and tobacco exposure. Current diabetic treatment includes oral agent (monotherapy). She is compliant with treatment all of the time. She is following a generally unhealthy diet. Her breakfast blood glucose range is generally 110-130 mg/dl. An ACE inhibitor/angiotensin II receptor blocker is being taken. Eye exam is current.  Hypertension This is a chronic problem. The current episode started more than 1 year ago. The problem has been resolved since onset. The problem is controlled. Associated symptoms include anxiety and peripheral edema ("at times"). Pertinent negatives include no chest pain, headaches, palpitations or shortness of breath. Risk factors for coronary artery disease include diabetes mellitus, family history, obesity, post-menopausal state, sedentary lifestyle and smoking/tobacco exposure. Past treatments include ACE inhibitors. The current treatment  provides mild improvement. There is no history of kidney disease, CAD/MI, CVA, PVD or a thyroid problem. There is no history of sleep apnea.  Gastrophageal Reflux She reports no chest pain, no coughing, no heartburn or no sore throat. This is a chronic problem. The current episode started more than 1 year ago. The problem occurs rarely. The problem has been resolved. The symptoms are aggravated by certain foods. Pertinent negatives include no muscle weakness. Risk factors include obesity. She has tried a PPI for the symptoms. The treatment provided significant relief.  Anxiety Presents for follow-up visit. Onset was 1 to 6 months ago. The problem has been waxing and waning. Symptoms include depressed mood, excessive worry, insomnia, nervous/anxious behavior and panic. Patient reports no chest pain, confusion, dizziness, irritability, palpitations or shortness of breath. Symptoms occur occasionally. The symptoms are aggravated by family issues. The quality of sleep is good.   Her past medical history is significant for anxiety/panic attacks and depression. Past treatments include benzodiazephines and SSRIs. The treatment provided moderate relief. Compliance with prior treatments has been good.      Review of Systems  Constitutional: Negative.  Negative for irritability.  HENT: Negative.  Negative for sore throat.   Eyes: Negative.   Respiratory: Negative.  Negative for cough and shortness of breath.   Cardiovascular: Negative.  Negative for chest pain and palpitations.  Gastrointestinal: Negative.  Negative for heartburn.  Endocrine: Negative.   Genitourinary: Negative.   Musculoskeletal: Negative.  Negative for muscle weakness.  Neurological: Negative.  Negative for dizziness and headaches.  Hematological: Negative.   Psychiatric/Behavioral: Negative for confusion. The patient is nervous/anxious and has insomnia.   All other systems reviewed and are negative.      Objective:   Physical  Exam  Constitutional: She is oriented to person, place, and time. She appears well-developed and well-nourished. No distress.  Pt morbid obese   HENT:  Head: Normocephalic and atraumatic.  Right Ear: External ear normal.  Left Ear: External ear normal. A middle ear effusion is present.  Nose: Nose normal.  Mouth/Throat: Oropharynx is clear and moist.  Enlarged tonsils 3+  Eyes: Pupils are equal, round, and reactive to light.  Neck: Normal range of motion. Neck supple. No thyromegaly present.  Cardiovascular: Normal rate, regular rhythm, normal heart sounds and intact distal pulses.   No murmur heard. Pulmonary/Chest: Effort normal and breath sounds normal. No respiratory distress. She has no wheezes.  Abdominal: Soft. Bowel sounds are normal. She exhibits no distension. There is no tenderness.  Musculoskeletal: Normal range of motion. She exhibits no edema or tenderness.  Neurological: She is alert and oriented to person, place, and time. She has normal reflexes. No cranial nerve deficit.  Skin: Skin is warm and dry.  Psychiatric: She has a normal mood and affect. Her behavior is normal. Judgment and thought content normal.  Vitals reviewed.     BP 122/80 mmHg  Pulse 87  Temp(Src) 97.4 F (36.3 C) (Oral)  Ht 5' 9" (1.753 m)  Wt 314 lb (142.429 kg)  BMI 46.35 kg/m2     Assessment & Plan:  1. Essential hypertension - CMP14+EGFR - lisinopril (PRINIVIL,ZESTRIL) 40 MG tablet; Take 1 tablet (40 mg total) by mouth daily.  Dispense: 90 tablet; Refill: 1 - amLODipine (NORVASC) 10 MG tablet; Take 1 tablet (10 mg total) by mouth daily.  Dispense: 90 tablet; Refill: 3  2. Gastroesophageal reflux disease, esophagitis presence not specified - CMP14+EGFR - Omeprazole 20 MG TBEC; Take 1 tablet (20 mg total) by mouth daily.  Dispense: 90 each; Refill: 3  3. Type 2 diabetes mellitus without complication, without long-term current use of insulin (HCC) - POCT glycosylated hemoglobin (Hb  A1C) - CMP14+EGFR - metFORMIN (GLUCOPHAGE) 500 MG tablet; Take 1 tablet (500 mg total) by mouth 2 (two) times daily with a meal.  Dispense: 180 tablet; Refill: 1  4. Human immunodeficiency virus (HIV) disease (HCC) - CMP14+EGFR  5. Vitamin D deficiency - CMP14+EGFR - Vit D  25 hydroxy (rtn osteoporosis monitoring)  6. Depression - CMP14+EGFR - citalopram (CELEXA) 20 MG tablet; Take 1 tablet (20 mg total) by mouth daily.  Dispense: 90 tablet; Refill: 3 - diazepam (VALIUM) 2 MG tablet; Take 1 tablet (2 mg total) by mouth every 12 (twelve) hours as needed for anxiety. Take one by mouth daily as needed for anxiety, prn  Dispense: 60 tablet; Refill: 3  7. GAD (generalized anxiety disorder) - CMP14+EGFR - citalopram (CELEXA) 20 MG tablet; Take 1 tablet (20 mg total) by mouth daily.  Dispense: 90 tablet; Refill: 3 - diazepam (VALIUM) 2 MG tablet; Take 1 tablet (2 mg total) by mouth every 12 (twelve) hours as needed for anxiety. Take one by mouth daily as needed for anxiety, prn  Dispense: 60 tablet; Refill: 3  8. Morbid obesity with BMI of 45.0-49.9, adult ([place]) - CMP14+EGFR   Continue all meds Labs pending Health Maintenance reviewed Diet and exercise encouraged RTO 3 months   [name], FNP

## 2014-10-25 ENCOUNTER — Other Ambulatory Visit: Payer: Self-pay | Admitting: *Deleted

## 2014-10-25 ENCOUNTER — Telehealth: Payer: Self-pay | Admitting: Family

## 2014-10-25 LAB — CMP14+EGFR
A/G RATIO: 1.7 (ref 1.1–2.5)
ALT: 10 IU/L (ref 0–32)
AST: 11 IU/L (ref 0–40)
Albumin: 4.3 g/dL (ref 3.5–5.5)
Alkaline Phosphatase: 80 IU/L (ref 39–117)
BUN/Creatinine Ratio: 24 — ABNORMAL HIGH (ref 8–20)
BUN: 16 mg/dL (ref 6–20)
Bilirubin Total: 0.2 mg/dL (ref 0.0–1.2)
CALCIUM: 9 mg/dL (ref 8.7–10.2)
CHLORIDE: 102 mmol/L (ref 97–108)
CO2: 20 mmol/L (ref 18–29)
Creatinine, Ser: 0.67 mg/dL (ref 0.57–1.00)
GFR, EST AFRICAN AMERICAN: 128 mL/min/{1.73_m2} (ref >59)
GFR, EST NON AFRICAN AMERICAN: 111 mL/min/{1.73_m2} (ref >59)
GLUCOSE: 61 mg/dL — AB (ref 65–99)
Globulin, Total: 2.6 g/dL (ref 1.5–4.5)
POTASSIUM: 4.7 mmol/L (ref 3.5–5.2)
Sodium: 144 mmol/L (ref 134–144)
TOTAL PROTEIN: 6.9 g/dL (ref 6.0–8.5)

## 2014-10-25 LAB — VITAMIN D 25 HYDROXY (VIT D DEFICIENCY, FRACTURES): Vit D, 25-Hydroxy: 50.9 ng/mL (ref 30.0–100.0)

## 2014-10-25 MED ORDER — GLUCOSE BLOOD VI STRP: ORAL_STRIP | Status: DC

## 2014-10-25 MED ORDER — ACCU-CHEK AVIVA DEVI: Status: AC

## 2014-10-25 MED ORDER — GLUCOSE BLOOD VI STRP
ORAL_STRIP | Status: DC
Start: 2014-10-25 — End: 2015-11-01

## 2014-10-25 NOTE — Telephone Encounter (Signed)
Onetouch not covered by pt's ins, ordered Accucheck Aviva device & strips per pharmacy

## 2014-10-25 NOTE — Telephone Encounter (Signed)
Spoke with pharmacy and patient was given a new meter yesterday.

## 2015-02-16 ENCOUNTER — Telehealth: Payer: Self-pay | Admitting: Family

## 2015-02-22 ENCOUNTER — Telehealth: Payer: Self-pay | Admitting: Family

## 2015-02-22 DIAGNOSIS — F329 Major depressive disorder, single episode, unspecified: Secondary | ICD-10-CM

## 2015-02-22 DIAGNOSIS — F32A Depression, unspecified: Secondary | ICD-10-CM

## 2015-02-22 DIAGNOSIS — F411 Generalized anxiety disorder: Secondary | ICD-10-CM

## 2015-02-22 MED ORDER — DIAZEPAM 2 MG PO TABS: 2.0000 mg | ORAL_TABLET | Freq: Two times a day (BID) | ORAL | Status: DC | PRN

## 2015-02-22 NOTE — Telephone Encounter (Signed)
RX ready for pick up 

## 2015-02-22 NOTE — Telephone Encounter (Signed)
rx faxed to pharmacy

## 2015-03-14 ENCOUNTER — Other Ambulatory Visit: Payer: Self-pay

## 2015-03-19 ENCOUNTER — Other Ambulatory Visit: Payer: Self-pay | Admitting: *Deleted

## 2015-05-10 ENCOUNTER — Other Ambulatory Visit: Payer: Self-pay | Admitting: *Deleted

## 2015-05-10 DIAGNOSIS — I10 Essential (primary) hypertension: Secondary | ICD-10-CM

## 2015-05-10 MED ORDER — LISINOPRIL 40 MG PO TABS: 40.0000 mg | ORAL_TABLET | Freq: Every day | ORAL | Status: DC

## 2015-06-06 ENCOUNTER — Other Ambulatory Visit: Payer: Self-pay

## 2015-06-06 DIAGNOSIS — F411 Generalized anxiety disorder: Secondary | ICD-10-CM

## 2015-06-06 DIAGNOSIS — I10 Essential (primary) hypertension: Secondary | ICD-10-CM

## 2015-06-06 DIAGNOSIS — F32A Depression, unspecified: Secondary | ICD-10-CM

## 2015-06-06 DIAGNOSIS — F329 Major depressive disorder, single episode, unspecified: Secondary | ICD-10-CM

## 2015-06-06 NOTE — Telephone Encounter (Signed)
Last seen 10/24/14  Jasmine Peck  Mail order requesting 90 day supply

## 2015-06-06 NOTE — Telephone Encounter (Signed)
Last seen 10/16  Jasmine Peck  If approved print for patient for mail order

## 2015-06-07 MED ORDER — LISINOPRIL 40 MG PO TABS: 40.0000 mg | ORAL_TABLET | Freq: Every day | ORAL | Status: DC

## 2015-06-07 MED ORDER — DIAZEPAM 2 MG PO TABS: 2.0000 mg | ORAL_TABLET | Freq: Two times a day (BID) | ORAL | Status: DC | PRN

## 2015-06-07 NOTE — Telephone Encounter (Signed)
Patient NTBS for follow up and lab work  

## 2015-06-13 ENCOUNTER — Other Ambulatory Visit: Payer: Self-pay | Admitting: Family

## 2015-06-14 NOTE — Telephone Encounter (Signed)
Patient NTBS for follow up and lab work  

## 2015-06-14 NOTE — Telephone Encounter (Signed)
Last seen 10/24/14 Jasmine Peck  If approved print for patient for mail order

## 2015-06-25 ENCOUNTER — Telehealth: Payer: Self-pay | Admitting: Family

## 2015-06-25 NOTE — Telephone Encounter (Signed)
Appt made for 7-10 days for suture removal. So appt set for Wednesday

## 2015-06-27 ENCOUNTER — Ambulatory Visit (INDEPENDENT_AMBULATORY_CARE_PROVIDER_SITE_OTHER): Payer: Medicaid Other | Admitting: Family Medicine

## 2015-06-27 ENCOUNTER — Telehealth: Payer: Self-pay

## 2015-06-27 ENCOUNTER — Encounter: Payer: Self-pay | Admitting: Family Medicine

## 2015-06-27 VITALS — BP 127/88 | HR 87 | Temp 98.0°F | Ht 69.0 in | Wt 333.8 lb

## 2015-06-27 DIAGNOSIS — F411 Generalized anxiety disorder: Secondary | ICD-10-CM

## 2015-06-27 DIAGNOSIS — F32A Depression, unspecified: Secondary | ICD-10-CM

## 2015-06-27 DIAGNOSIS — Z4802 Encounter for removal of sutures: Secondary | ICD-10-CM | POA: Diagnosis not present

## 2015-06-27 DIAGNOSIS — E119 Type 2 diabetes mellitus without complications: Secondary | ICD-10-CM | POA: Diagnosis not present

## 2015-06-27 DIAGNOSIS — F329 Major depressive disorder, single episode, unspecified: Secondary | ICD-10-CM

## 2015-06-27 DIAGNOSIS — I10 Essential (primary) hypertension: Secondary | ICD-10-CM | POA: Diagnosis not present

## 2015-06-27 DIAGNOSIS — K219 Gastro-esophageal reflux disease without esophagitis: Secondary | ICD-10-CM

## 2015-06-27 LAB — BAYER DCA HB A1C WAIVED: HB A1C (BAYER DCA - WAIVED): 6.1 % (ref <7.0)

## 2015-06-27 MED ORDER — METFORMIN HCL 500 MG PO TABS
500.0000 mg | ORAL_TABLET | Freq: Two times a day (BID) | ORAL | Status: DC
Start: 2015-06-27 — End: 2015-11-02

## 2015-06-27 MED ORDER — CITALOPRAM HYDROBROMIDE 20 MG PO TABS: 20.0000 mg | ORAL_TABLET | Freq: Every day | ORAL | Status: DC

## 2015-06-27 MED ORDER — OMEPRAZOLE 20 MG PO TBEC: 1.0000 | DELAYED_RELEASE_TABLET | Freq: Every day | ORAL | Status: DC

## 2015-06-27 MED ORDER — AMLODIPINE BESYLATE 10 MG PO TABS: 10.0000 mg | ORAL_TABLET | Freq: Every day | ORAL | Status: DC

## 2015-06-27 MED ORDER — LISINOPRIL 40 MG PO TABS: 40.0000 mg | ORAL_TABLET | Freq: Every day | ORAL | Status: DC

## 2015-06-27 MED ORDER — GABAPENTIN 300 MG PO CAPS: 300.0000 mg | ORAL_CAPSULE | Freq: Two times a day (BID) | ORAL | Status: DC

## 2015-06-27 NOTE — Progress Notes (Signed)
BP 127/88 mmHg  Pulse 87  Temp(Src) 98 F (36.7 C) (Oral)  Ht _0  (1.753 m)  Wt 333 lb 12.8 oz (151.411 kg)  BMI 49.27 kg/m2  LMP    Subjective:    Patient ID: Jasmine Peck, female    DOB: Dec 15, 1975, 40 y.o.   MRN: [id number]  HPI: Jasmine Peck is a 40 y.o. female presenting on 06/27/2015 for Medication refills; Suture / Staple Removal; and Labwork   HPI Suture removal Patient is coming in for a suture removal. She had one stitch placed after a dermatology procedure where they removed a lesion from the left lower side of her abdomen. She said she received records from them that it was benign. She denies any drainage or purulence.  Anxiety and depression Patient is also coming in today to see if she can get refills for medications. She says she's been doing well on her anxiety medication denies any suicidal ideations. She will follow-up with her usual provider at her next visit.  Type 2 diabetes Patient has type 2 diabetes and is currently on metformin. She denies any issues with medication. She says that her blood sugars when she checks them are typically in the 120-140 range in the morning. She is due for hemoglobin A1c.  GERD Patient is coming in for refill on her reflux medication. She is currently on omeprazole is doing well for her. She denies any blood in her stool or nausea or vomiting.  Relevant past medical, surgical, family and social history reviewed and updated as indicated. Interim medical history since our last visit reviewed. Allergies and medications reviewed and updated.  Review of Systems  Constitutional: Negative for fever and chills.  HENT: Negative for congestion, ear discharge and ear pain.   Eyes: Negative for redness and visual disturbance.  Respiratory: Negative for chest tightness and shortness of breath.   Cardiovascular: Negative for chest pain and leg swelling.  Genitourinary: Negative for dysuria and difficulty urinating.    Musculoskeletal: Negative for back pain and gait problem.  Skin: Positive for wound (Single suture on left abdomen appears to be healing well.). Negative for rash.  Neurological: Negative for light-headedness and headaches.  Psychiatric/Behavioral: Positive for dysphoric mood. Negative for suicidal ideas, behavioral problems, sleep disturbance, self-injury and agitation. The patient is nervous/anxious.   All other systems reviewed and are negative.   Per HPI unless specifically indicated above     Medication List       This list is accurate as of: 06/27/15  2:37 PM.  Always use your most recent med list.               abacavir-dolutegravir-lamiVUDine 600-50-300 MG tablet  Commonly known as:  TRIUMEQ  Take by mouth.     TRIUMEQ 600-50-300 MG tablet  Generic drug:  abacavir-dolutegravir-lamiVUDine  TAKE 1 TABLET BY MOUTH DAILY.     ACCU-CHEK AVIVA device  Use as instructed     albuterol 108 (90 Base) MCG/ACT inhaler  Commonly known as:  PROVENTIL HFA;VENTOLIN HFA  Inhale into the lungs every 6 (six) hours as needed for wheezing or shortness of breath.     albuterol 108 (90 Base) MCG/ACT inhaler  Commonly known as:  PROVENTIL HFA;VENTOLIN HFA  Inhale into the lungs.     amLODipine 10 MG tablet  Commonly known as:  NORVASC  Take 1 tablet (10 mg total) by mouth daily.     citalopram 20 MG tablet  Commonly known as:  CELEXA  Take 1  tablet (20 mg total) by mouth daily.     diazepam 2 MG tablet  Commonly known as:  VALIUM  Take 1 tablet (2 mg total) by mouth every 12 (twelve) hours as needed for anxiety. Take one by mouth daily as needed for anxiety, prn     fluocinonide 0.05 % external solution  Commonly known as:  LIDEX  Apply twice a day to affected areas of scalp until smooth     gabapentin 300 MG capsule  Commonly known as:  NEURONTIN  Take 1 capsule (300 mg total) by mouth 2 (two) times daily.     glucose blood test strip  Use as instructed     lisinopril  40 MG tablet  Commonly known as:  PRINIVIL,ZESTRIL  Take 1 tablet (40 mg total) by mouth daily.     metFORMIN 500 MG tablet  Commonly known as:  GLUCOPHAGE  Take 1 tablet (500 mg total) by mouth 2 (two) times daily with a meal.     Omeprazole 20 MG Tbec  Take 1 tablet (20 mg total) by mouth daily.           Objective:    BP 127/88 mmHg  Pulse 87  Temp(Src) 98 F (36.7 C) (Oral)  Ht _0  (1.753 m)  Wt 333 lb 12.8 oz (151.411 kg)  BMI 49.27 kg/m2  LMP   Wt Readings from Last 3 Encounters:  06/27/15 333 lb 12.8 oz (151.411 kg)  10/24/14 314 lb (142.429 kg)  05/29/14 307 lb (139.254 kg)    Physical Exam  Constitutional: She is oriented to person, place, and time. She appears well-developed and well-nourished. No distress.  Eyes: Conjunctivae and EOM are normal. Pupils are equal, round, and reactive to light.  Cardiovascular: Normal rate, regular rhythm, normal heart sounds and intact distal pulses.   No murmur heard. Pulmonary/Chest: Effort normal and breath sounds normal. No respiratory distress. She has no wheezes.  Musculoskeletal: Normal range of motion. She exhibits no edema or tenderness.  Neurological: She is alert and oriented to person, place, and time. Coordination normal.  Skin: Skin is warm and dry. Laceration (Well-healing, has single suture in it, able to remove single suture without any issues. Appears to be healing very well) noted. No rash noted. She is not diaphoretic.  Psychiatric: Her behavior is normal. Thought content normal. Her mood appears anxious. She exhibits a depressed mood. She expresses no suicidal ideation. She expresses no suicidal plans.  Nursing note and vitals reviewed.     Assessment & Plan:       Problem List Items Addressed This Visit      Cardiovascular and Mediastinum   Essential hypertension - Primary   Relevant Medications   amLODipine (NORVASC) 10 MG tablet   lisinopril (PRINIVIL,ZESTRIL) 40 MG tablet   Other Relevant  Orders   CMP14+EGFR     Digestive   GERD (gastroesophageal reflux disease)   Relevant Medications   Omeprazole 20 MG TBEC   Other Relevant Orders   CBC with Differential/Platelet     Endocrine   Diabetes mellitus, type 2 (HCC)   Relevant Medications   lisinopril (PRINIVIL,ZESTRIL) 40 MG tablet   metFORMIN (GLUCOPHAGE) 500 MG tablet   Other Relevant Orders   Bayer DCA Hb A1c Waived   CMP14+EGFR   Lipid panel   TSH     Other   GAD (generalized anxiety disorder)   Relevant Medications   citalopram (CELEXA) 20 MG tablet   Other Relevant Orders   TSH  CBC with Differential/Platelet   Depression   Relevant Medications   citalopram (CELEXA) 20 MG tablet      Follow up plan: Return in about 3 months (around 09/27/2015), or if symptoms worsen or fail to improve, for Recheck anxiety and depression and hypertension.  Counseling provided for all of the vaccine components Orders Placed This Encounter  Procedures  . Bayer DCA Hb A1c Waived  . CMP14+EGFR  . Lipid panel  . TSH  . CBC with Differential/Platelet    Caryl Pina, MD [facility] 06/27/2015, 2:37 PM

## 2015-06-27 NOTE — Telephone Encounter (Signed)
Patient came in today for visit with Dr. Warrick Parisian and refills on medication.  It shows that you refilled her Valium 2 mg on 06/07/15 and that the script was printed.  The patient said it was never picked up, that we called it in to her pharmacy but that the pharmacy is telling her they never got it or have any refills on file.  I looked at the prescription box up front and it is not in there.  Dr. Warrick Parisian told her we would leave you a message about this and your nurse would call her on Thursday to let her know if you are refilling the medication.

## 2015-06-28 LAB — CMP14+EGFR
A/G RATIO: 1.5 (ref 1.2–2.2)
ALBUMIN: 4.2 g/dL (ref 3.5–5.5)
ALK PHOS: 90 IU/L (ref 39–117)
ALT: 9 IU/L (ref 0–32)
AST: 12 IU/L (ref 0–40)
BUN / CREAT RATIO: 13 (ref 9–23)
BUN: 12 mg/dL (ref 6–24)
CHLORIDE: 101 mmol/L (ref 96–106)
CO2: 20 mmol/L (ref 18–29)
Calcium: 9 mg/dL (ref 8.7–10.2)
Creatinine, Ser: 0.89 mg/dL (ref 0.57–1.00)
GFR calc non Af Amer: 81 mL/min/{1.73_m2} (ref >59)
GFR, EST AFRICAN AMERICAN: 94 mL/min/{1.73_m2} (ref >59)
GLOBULIN, TOTAL: 2.8 g/dL (ref 1.5–4.5)
GLUCOSE: 95 mg/dL (ref 65–99)
POTASSIUM: 4.6 mmol/L (ref 3.5–5.2)
SODIUM: 140 mmol/L (ref 134–144)
TOTAL PROTEIN: 7 g/dL (ref 6.0–8.5)

## 2015-06-28 LAB — TSH: TSH: 1.24 u[IU]/mL (ref 0.450–4.500)

## 2015-06-28 LAB — CBC WITH DIFFERENTIAL/PLATELET
BASOS ABS: 0 10*3/uL (ref 0.0–0.2)
Basos: 0 %
EOS (ABSOLUTE): 0.1 10*3/uL (ref 0.0–0.4)
EOS: 1 %
HEMATOCRIT: 40 % (ref 34.0–46.6)
HEMOGLOBIN: 13.3 g/dL (ref 11.1–15.9)
IMMATURE GRANULOCYTES: 0 %
Immature Grans (Abs): 0 10*3/uL (ref 0.0–0.1)
LYMPHS ABS: 3.3 10*3/uL — AB (ref 0.7–3.1)
LYMPHS: 29 %
MCH: 31.2 pg (ref 26.6–33.0)
MCHC: 33.3 g/dL (ref 31.5–35.7)
MCV: 94 fL (ref 79–97)
MONOCYTES: 7 %
Monocytes Absolute: 0.8 10*3/uL (ref 0.1–0.9)
NEUTROS PCT: 63 %
Neutrophils Absolute: 7.3 10*3/uL — ABNORMAL HIGH (ref 1.4–7.0)
Platelets: 371 10*3/uL (ref 150–379)
RBC: 4.26 x10E6/uL (ref 3.77–5.28)
RDW: 14.7 % (ref 12.3–15.4)
WBC: 11.6 10*3/uL — AB (ref 3.4–10.8)

## 2015-06-28 LAB — LIPID PANEL
CHOLESTEROL TOTAL: 162 mg/dL (ref 100–199)
Chol/HDL Ratio: 4.3 ratio units (ref 0.0–4.4)
HDL: 38 mg/dL — ABNORMAL LOW (ref >39)
LDL Calculated: 94 mg/dL (ref 0–99)
Triglycerides: 152 mg/dL — ABNORMAL HIGH (ref 0–149)
VLDL Cholesterol Cal: 30 mg/dL (ref 5–40)

## 2015-06-28 MED ORDER — DIAZEPAM 2 MG PO TABS
2.0000 mg | ORAL_TABLET | Freq: Two times a day (BID) | ORAL | Status: DC | PRN
Start: 2015-06-28 — End: 2015-11-05

## 2015-06-28 NOTE — Telephone Encounter (Signed)
Patient informed via voicemail that prescription is ready for pick up, placed prescription at the front.

## 2015-06-28 NOTE — Telephone Encounter (Signed)
RX ready for pick up 

## 2015-08-30 ENCOUNTER — Telehealth: Payer: Self-pay | Admitting: Family

## 2015-08-30 NOTE — Telephone Encounter (Signed)
[facility] worked on records 08/16/15

## 2015-10-26 DIAGNOSIS — N879 Dysplasia of cervix uteri, unspecified: Secondary | ICD-10-CM | POA: Insufficient documentation

## 2015-11-01 ENCOUNTER — Other Ambulatory Visit: Payer: Self-pay | Admitting: Family

## 2015-11-01 DIAGNOSIS — F331 Major depressive disorder, recurrent, moderate: Secondary | ICD-10-CM

## 2015-11-01 DIAGNOSIS — F32A Depression, unspecified: Secondary | ICD-10-CM

## 2015-11-01 DIAGNOSIS — F329 Major depressive disorder, single episode, unspecified: Secondary | ICD-10-CM

## 2015-11-01 DIAGNOSIS — F411 Generalized anxiety disorder: Secondary | ICD-10-CM

## 2015-11-01 NOTE — Telephone Encounter (Signed)
Patient NTBS for follow up and lab work  

## 2015-11-02 ENCOUNTER — Other Ambulatory Visit: Payer: Self-pay | Admitting: *Deleted

## 2015-11-02 DIAGNOSIS — E119 Type 2 diabetes mellitus without complications: Secondary | ICD-10-CM

## 2015-11-02 MED ORDER — METFORMIN HCL 500 MG PO TABS: 500.0000 mg | ORAL_TABLET | Freq: Two times a day (BID) | ORAL | 0 refills | Status: DC

## 2015-11-02 NOTE — Telephone Encounter (Signed)
Pt is unemployed. Last seen in June. A1C was 6.1. Was told she could be seen every 6 months.

## 2015-11-05 MED ORDER — DIAZEPAM 2 MG PO TABS: 2.0000 mg | ORAL_TABLET | Freq: Two times a day (BID) | ORAL | 2 refills | Status: DC | PRN

## 2015-11-05 NOTE — Telephone Encounter (Signed)
Valium refill Prescription sent to pharmacy

## 2015-11-05 NOTE — Telephone Encounter (Signed)
Refill called in to pharmacy

## 2015-11-05 NOTE — Addendum Note (Signed)
Addended by: [name] A on: 11/05/2015 08:19 AM   Modules accepted: Orders

## 2015-12-27 ENCOUNTER — Other Ambulatory Visit: Payer: Self-pay | Admitting: Family

## 2016-01-28 ENCOUNTER — Other Ambulatory Visit: Payer: Self-pay | Admitting: Family

## 2016-01-28 DIAGNOSIS — F411 Generalized anxiety disorder: Secondary | ICD-10-CM

## 2016-01-28 DIAGNOSIS — F331 Major depressive disorder, recurrent, moderate: Secondary | ICD-10-CM

## 2016-01-30 NOTE — Telephone Encounter (Signed)
Last seen 01/11/2016. Last seen 06/27/2015

## 2016-01-30 NOTE — Telephone Encounter (Signed)
Patient NTBS for follow up and lab work  

## 2016-02-25 ENCOUNTER — Other Ambulatory Visit: Payer: Self-pay | Admitting: Family

## 2016-03-27 ENCOUNTER — Other Ambulatory Visit: Payer: Self-pay | Admitting: Family Medicine

## 2016-03-27 DIAGNOSIS — E119 Type 2 diabetes mellitus without complications: Secondary | ICD-10-CM

## 2016-05-23 ENCOUNTER — Other Ambulatory Visit: Payer: Self-pay | Admitting: Family Medicine

## 2016-05-23 DIAGNOSIS — I10 Essential (primary) hypertension: Secondary | ICD-10-CM

## 2016-06-18 ENCOUNTER — Ambulatory Visit (INDEPENDENT_AMBULATORY_CARE_PROVIDER_SITE_OTHER): Payer: Medicaid Other | Admitting: Family

## 2016-06-18 ENCOUNTER — Encounter (INDEPENDENT_AMBULATORY_CARE_PROVIDER_SITE_OTHER): Payer: Self-pay

## 2016-06-18 ENCOUNTER — Encounter: Payer: Self-pay | Admitting: Family

## 2016-06-18 VITALS — BP 163/107 | HR 77 | Temp 97.3°F | Ht 69.0 in | Wt 330.0 lb

## 2016-06-18 DIAGNOSIS — E1142 Type 2 diabetes mellitus with diabetic polyneuropathy: Secondary | ICD-10-CM | POA: Insufficient documentation

## 2016-06-18 DIAGNOSIS — I10 Essential (primary) hypertension: Secondary | ICD-10-CM

## 2016-06-18 DIAGNOSIS — B20 Human immunodeficiency virus [HIV] disease: Secondary | ICD-10-CM

## 2016-06-18 DIAGNOSIS — F411 Generalized anxiety disorder: Secondary | ICD-10-CM

## 2016-06-18 DIAGNOSIS — E559 Vitamin D deficiency, unspecified: Secondary | ICD-10-CM | POA: Diagnosis not present

## 2016-06-18 DIAGNOSIS — E119 Type 2 diabetes mellitus without complications: Secondary | ICD-10-CM

## 2016-06-18 DIAGNOSIS — K219 Gastro-esophageal reflux disease without esophagitis: Secondary | ICD-10-CM | POA: Diagnosis not present

## 2016-06-18 DIAGNOSIS — F331 Major depressive disorder, recurrent, moderate: Secondary | ICD-10-CM

## 2016-06-18 DIAGNOSIS — Z6841 Body Mass Index (BMI) 40.0 and over, adult: Secondary | ICD-10-CM

## 2016-06-18 DIAGNOSIS — F17200 Nicotine dependence, unspecified, uncomplicated: Secondary | ICD-10-CM | POA: Insufficient documentation

## 2016-06-18 LAB — BAYER DCA HB A1C WAIVED: HB A1C: 5.8 % (ref <7.0)

## 2016-06-18 MED ORDER — DIAZEPAM 2 MG PO TABS: 2.0000 mg | ORAL_TABLET | Freq: Two times a day (BID) | ORAL | 2 refills | Status: DC | PRN

## 2016-06-18 MED ORDER — FUROSEMIDE 20 MG PO TABS: 20.0000 mg | ORAL_TABLET | Freq: Every day | ORAL | 11 refills | Status: DC

## 2016-06-18 NOTE — Patient Instructions (Signed)
Peripheral Neuropathy Peripheral neuropathy is a type of nerve damage. It affects nerves that carry signals between the spinal cord and other parts of the body. These are called peripheral nerves. With peripheral neuropathy, one nerve or a group of nerves may be damaged. What are the causes? Many things can damage peripheral nerves. For some people with peripheral neuropathy, the cause is unknown. Some causes include:  Diabetes. This is the most common cause of peripheral neuropathy.  Injury to a nerve.  Pressure or stress on a nerve that lasts a long time.  Too little vitamin B. Alcoholism can lead to this.  Infections.  Autoimmune diseases, such as multiple sclerosis and systemic lupus erythematosus.  Inherited nerve diseases.  Some medicines, such as cancer drugs.  Toxic substances, such as lead and mercury.  Too little blood flowing to the legs.  Kidney disease.  Thyroid disease.  What are the signs or symptoms? Different people have different symptoms. The symptoms you have will depend on which of your nerves is damaged. Common symptoms include:  Loss of feeling (numbness) in the feet and hands.  Tingling in the feet and hands.  Pain that burns.  Very sensitive skin.  Weakness.  Not being able to move a part of the body (paralysis).  Muscle twitching.  Clumsiness or poor coordination.  Loss of balance.  Not being able to control your bladder.  Feeling dizzy.  Sexual problems.  How is this diagnosed? Peripheral neuropathy is a symptom, not a disease. Finding the cause of peripheral neuropathy can be hard. To figure that out, your health care provider will take a medical history and do a physical exam. A neurological exam will also be done. This involves checking things affected by your brain, spinal cord, and nerves (nervous system). For example, your health care provider will check your reflexes, how you move, and what you can feel. Other types of tests  may also be ordered, such as:  Blood tests.  A test of the fluid in your spinal cord.  Imaging tests, such as CT scans or an MRI.  Electromyography (EMG). This test checks the nerves that control muscles.  Nerve conduction velocity tests. These tests check how fast messages pass through your nerves.  Nerve biopsy. A small piece of nerve is removed. It is then checked under a microscope.  How is this treated?  Medicine is often used to treat peripheral neuropathy. Medicines may include: ? Pain-relieving medicines. Prescription or over-the-counter medicine may be suggested. ? Antiseizure medicine. This may be used for pain. ? Antidepressants. These also may help ease pain from neuropathy. ? Lidocaine. This is a numbing medicine. You might wear a patch or be given a shot. ? Mexiletine. This medicine is typically used to help control irregular heart rhythms.  Surgery. Surgery may be needed to relieve pressure on a nerve or to destroy a nerve that is causing pain.  Physical therapy to help movement.  Assistive devices to help movement. Follow these instructions at home:  Only take over-the-counter or prescription medicines as directed by your health care provider. Follow the instructions carefully for any given medicines. Do not take any other medicines without first getting approval from your health care provider.  If you have diabetes, work closely with your health care provider to keep your blood sugar under control.  If you have numbness in your feet: ? Check every day for signs of injury or infection. Watch for redness, warmth, and swelling. ? Wear padded socks and comfortable   shoes. These help protect your feet.  Do not do things that put pressure on your damaged nerve.  Do not smoke. Smoking keeps blood from getting to damaged nerves.  Avoid or limit alcohol. Too much alcohol can cause a lack of B vitamins. These vitamins are needed for healthy nerves.  Develop a good  support system. Coping with peripheral neuropathy can be stressful. Talk to a mental health specialist or join a support group if you are struggling.  Follow up with your health care provider as directed. Contact a health care provider if:  You have new signs or symptoms of peripheral neuropathy.  You are struggling emotionally from dealing with peripheral neuropathy.  You have a fever. Get help right away if:  You have an injury or infection that is not healing.  You feel very dizzy or begin vomiting.  You have chest pain.  You have trouble breathing. This information is not intended to replace advice given to you by your health care provider. Make sure you discuss any questions you have with your health care provider. Document Released: 12/20/2001 Document Revised: 06/07/2015 Document Reviewed: 09/06/2012 [employer] Interactive Patient Education  2017 [employer]  

## 2016-06-18 NOTE — Progress Notes (Signed)
Subjective:    Patient ID: Jasmine Peck, female    DOB: [birth year], 41 y.o.   MRN: [id number]  Pt presents to the office today for chronic follow up. Pt is HIV positive and goes to [facility] to manages that. Diabetes  She presents for her follow-up diabetic visit. She has type 2 diabetes mellitus. Her disease course has been stable. Hypoglycemia symptoms include nervousness/anxiousness. Associated symptoms include foot paresthesias. Pertinent negatives for diabetes include no blurred vision and no visual change. There are no hypoglycemic complications. Symptoms are stable. Diabetic complications include peripheral neuropathy. Pertinent negatives for diabetic complications include no CVA or heart disease. Risk factors for coronary artery disease include diabetes mellitus, dyslipidemia, family history, hypertension, obesity and post-menopausal. Her breakfast blood glucose range is generally 110-130 mg/dl. Eye exam is not current.  Hypertension  This is a chronic problem. The current episode started more than 1 year ago. The problem is uncontrolled. Associated symptoms include anxiety, malaise/fatigue and peripheral edema. Pertinent negatives include no blurred vision or shortness of breath. Risk factors for coronary artery disease include diabetes mellitus, dyslipidemia, family history, obesity, sedentary lifestyle and post-menopausal state. Past treatments include calcium channel blockers and ACE inhibitors. The current treatment provides moderate improvement. There is no history of kidney disease or CVA.  Gastroesophageal Reflux  She reports no belching, no coughing or no heartburn. This is a chronic problem. The current episode started more than 1 year ago. The problem occurs rarely. The problem has been resolved. The symptoms are aggravated by certain foods. She has tried a PPI for the symptoms. The treatment provided moderate relief.  Depression         This is a chronic problem.  The current episode  started more than 1 year ago.   The onset quality is gradual.   The problem occurs intermittently.  The problem has been waxing and waning since onset.  Associated symptoms include no helplessness, no hopelessness and not sad.  Past medical history includes anxiety.   Anxiety  Presents for follow-up visit. Symptoms include excessive worry and nervous/anxious behavior. Patient reports no shortness of breath. The severity of symptoms is moderate.    Diabetic Neuropathy PT currently taking gabapentin 300 mg BID. PT reports burning 7-8 out 10.     Review of Systems  Constitutional: Positive for malaise/fatigue.  Eyes: Negative for blurred vision.  Respiratory: Negative for cough and shortness of breath.   Gastrointestinal: Negative for heartburn.  Psychiatric/Behavioral: Positive for depression. The patient is nervous/anxious.   All other systems reviewed and are negative.      Objective:   Physical Exam  Constitutional: She is oriented to person, place, and time. She appears well-developed and well-nourished. No distress.  HENT:  Head: Normocephalic and atraumatic.  Right Ear: External ear normal.  Left Ear: External ear normal.  Nose: Nose normal.  Mouth/Throat: Oropharynx is clear and moist.  Eyes: Pupils are equal, round, and reactive to light.  Neck: Normal range of motion. Neck supple. No thyromegaly present.  Cardiovascular: Normal rate, regular rhythm, normal heart sounds and intact distal pulses.   No murmur heard. Pulmonary/Chest: Effort normal and breath sounds normal. No respiratory distress. She has no wheezes.  Abdominal: Soft. Bowel sounds are normal. She exhibits no distension. There is no tenderness.  Musculoskeletal: Normal range of motion. She exhibits tenderness (BLE). She exhibits no edema.  Neurological: She is alert and oriented to person, place, and time.  Skin: Skin is warm and dry.  Psychiatric: She has  a normal mood and affect. Her behavior is normal.  Judgment and thought content normal.  Vitals reviewed.   BP (!) 163/107   Pulse 77   Temp 97.3 F (36.3 C) (Oral)   Ht _0  (1.753 m)   Wt (!) 330 lb (149.7 kg)   LMP 06/15/2016 (Approximate)   BMI 48.73 kg/m       Assessment & Plan:  1. Essential hypertension -PT started on lasix today -Dash diet information discussed -Exercise encouraged - Stress Management  -Continue current meds - CMP14+EGFR - furosemide (LASIX) 20 MG tablet; Take 1 tablet (20 mg total) by mouth daily.  Dispense: 30 tablet; Refill: 11  2. Gastroesophageal reflux disease, esophagitis presence not specified - CMP14+EGFR  3. Type 2 diabetes mellitus without complication, without long-term current use of insulin (HCC) - CMP14+EGFR - Lipid panel - Bayer DCA Hb A1c Waived - Microalbumin / creatinine urine ratio - Ambulatory referral to Ophthalmology  4. Morbid obesity with BMI of 45.0-49.9, adult (HCC) - CMP14+EGFR  5. Vitamin D deficiency - CMP14+EGFR  6. GAD (generalized anxiety disorder) - CMP14+EGFR - diazepam (VALIUM) 2 MG tablet; Take 1 tablet (2 mg total) by mouth every 12 (twelve) hours as needed for anxiety. Take one by mouth daily as needed for anxiety, prn  Dispense: 60 tablet; Refill: 2  7. Moderate episode of recurrent major depressive disorder (HCC) - CMP14+EGFR - diazepam (VALIUM) 2 MG tablet; Take 1 tablet (2 mg total) by mouth every 12 (twelve) hours as needed for anxiety. Take one by mouth daily as needed for anxiety, prn  Dispense: 60 tablet; Refill: 2  8. Human immunodeficiency virus (HIV) disease ([place]) - CMP14+EGFR  9. Diabetic peripheral neuropathy (HCC) - CMP14+EGFR - Bayer DCA Hb A1c Waived  10. Current smoker - CMP14+EGFR   Continue all meds Labs pending Health Maintenance reviewed Diet and exercise encouraged RTO 2 weeks to recheck HTN  [name], FNP

## 2016-06-19 ENCOUNTER — Telehealth: Payer: Self-pay | Admitting: Family

## 2016-06-19 DIAGNOSIS — I10 Essential (primary) hypertension: Secondary | ICD-10-CM

## 2016-06-19 DIAGNOSIS — E119 Type 2 diabetes mellitus without complications: Secondary | ICD-10-CM

## 2016-06-19 DIAGNOSIS — F411 Generalized anxiety disorder: Secondary | ICD-10-CM

## 2016-06-19 DIAGNOSIS — K219 Gastro-esophageal reflux disease without esophagitis: Secondary | ICD-10-CM

## 2016-06-19 LAB — CMP14+EGFR
ALBUMIN: 4.5 g/dL (ref 3.5–5.5)
ALK PHOS: 84 IU/L (ref 39–117)
ALT: 9 IU/L (ref 0–32)
AST: 11 IU/L (ref 0–40)
Albumin/Globulin Ratio: 1.6 (ref 1.2–2.2)
BILIRUBIN TOTAL: 0.3 mg/dL (ref 0.0–1.2)
BUN / CREAT RATIO: 16 (ref 9–23)
BUN: 11 mg/dL (ref 6–24)
CHLORIDE: 105 mmol/L (ref 96–106)
CO2: 25 mmol/L (ref 18–29)
Calcium: 9.3 mg/dL (ref 8.7–10.2)
Creatinine, Ser: 0.68 mg/dL (ref 0.57–1.00)
GFR calc Af Amer: 126 mL/min/{1.73_m2} (ref >59)
GFR calc non Af Amer: 109 mL/min/{1.73_m2} (ref >59)
GLUCOSE: 96 mg/dL (ref 65–99)
Globulin, Total: 2.8 g/dL (ref 1.5–4.5)
Potassium: 3.9 mmol/L (ref 3.5–5.2)
Sodium: 146 mmol/L — ABNORMAL HIGH (ref 134–144)
Total Protein: 7.3 g/dL (ref 6.0–8.5)

## 2016-06-19 LAB — LIPID PANEL
CHOLESTEROL TOTAL: 163 mg/dL (ref 100–199)
Chol/HDL Ratio: 3.2 ratio (ref 0.0–4.4)
HDL: 51 mg/dL (ref >39)
LDL Calculated: 94 mg/dL (ref 0–99)
Triglycerides: 89 mg/dL (ref 0–149)
VLDL CHOLESTEROL CAL: 18 mg/dL (ref 5–40)

## 2016-06-19 LAB — MICROALBUMIN / CREATININE URINE RATIO
Creatinine, Urine: 65.6 mg/dL
Microalb/Creat Ratio: <4.6 mg/g creat (ref 0.0–30.0)
Microalbumin, Urine: <3 ug/mL

## 2016-06-19 MED ORDER — OMEPRAZOLE 20 MG PO TBEC: 1.0000 | DELAYED_RELEASE_TABLET | Freq: Every day | ORAL | 3 refills | Status: DC

## 2016-06-19 MED ORDER — CITALOPRAM HYDROBROMIDE 20 MG PO TABS: 20.0000 mg | ORAL_TABLET | Freq: Every day | ORAL | 3 refills | Status: DC

## 2016-06-19 MED ORDER — GABAPENTIN 300 MG PO CAPS: 300.0000 mg | ORAL_CAPSULE | Freq: Two times a day (BID) | ORAL | 3 refills | Status: DC

## 2016-06-19 MED ORDER — METFORMIN HCL 500 MG PO TABS: 1000.0000 mg | ORAL_TABLET | Freq: Two times a day (BID) | ORAL | 0 refills | Status: DC

## 2016-06-19 MED ORDER — AMLODIPINE BESYLATE 10 MG PO TABS: 10.0000 mg | ORAL_TABLET | Freq: Every day | ORAL | 3 refills | Status: DC

## 2016-06-19 MED ORDER — LISINOPRIL 40 MG PO TABS: 40.0000 mg | ORAL_TABLET | Freq: Every day | ORAL | 2 refills | Status: DC

## 2016-06-19 MED ORDER — FUROSEMIDE 20 MG PO TABS: 20.0000 mg | ORAL_TABLET | Freq: Every day | ORAL | 2 refills | Status: DC

## 2016-06-19 NOTE — Telephone Encounter (Signed)
Prescription sent to pharmacy.

## 2016-07-03 ENCOUNTER — Ambulatory Visit: Payer: Medicaid Other | Admitting: Family

## 2016-07-08 ENCOUNTER — Telehealth: Payer: Self-pay | Admitting: Family

## 2016-07-08 DIAGNOSIS — F331 Major depressive disorder, recurrent, moderate: Secondary | ICD-10-CM

## 2016-07-08 DIAGNOSIS — F411 Generalized anxiety disorder: Secondary | ICD-10-CM

## 2016-07-08 MED ORDER — DIAZEPAM 2 MG PO TABS: 2.0000 mg | ORAL_TABLET | Freq: Two times a day (BID) | ORAL | 2 refills | Status: DC | PRN

## 2016-07-08 NOTE — Telephone Encounter (Signed)
Will you verify instructions please

## 2016-07-22 ENCOUNTER — Ambulatory Visit (INDEPENDENT_AMBULATORY_CARE_PROVIDER_SITE_OTHER): Payer: Medicaid Other | Admitting: Family

## 2016-07-22 ENCOUNTER — Encounter: Payer: Self-pay | Admitting: Family

## 2016-07-22 VITALS — BP 121/80 | HR 82 | Temp 97.2°F | Ht 69.0 in | Wt 331.0 lb

## 2016-07-22 DIAGNOSIS — Z6841 Body Mass Index (BMI) 40.0 and over, adult: Secondary | ICD-10-CM | POA: Diagnosis not present

## 2016-07-22 DIAGNOSIS — F17200 Nicotine dependence, unspecified, uncomplicated: Secondary | ICD-10-CM

## 2016-07-22 DIAGNOSIS — R609 Edema, unspecified: Secondary | ICD-10-CM

## 2016-07-22 NOTE — Patient Instructions (Signed)

## 2016-07-22 NOTE — Progress Notes (Addendum)
   Subjective:    Patient ID: Jasmine Peck, female    DOB: 10-07-1975, 41 y.o.   MRN: [id number]  HPI Pt presents to the office today with recurrent bilateral leg swelling. PT states when she wakes up in the morning her legs are normal, but by mid-afternoon she has bilateral ankle swelling. Pt states standing makes the swelling worse. Pt is taking lasix, but can not tell a difference.   Pt does not wear compression hose. Pt states she does try to limit her salt intake.    Review of Systems  Cardiovascular: Positive for leg swelling.  All other systems reviewed and are negative.      Objective:   Physical Exam  Constitutional: She is oriented to person, place, and time. She appears well-developed and well-nourished. No distress.  Morbid obese  HENT:  Head: Normocephalic and atraumatic.  Right Ear: External ear normal.  Mouth/Throat: Oropharynx is clear and moist.  Eyes: Pupils are equal, round, and reactive to light.  Neck: Normal range of motion. Neck supple. No thyromegaly present.  Cardiovascular: Normal rate, regular rhythm, normal heart sounds and intact distal pulses.   No murmur heard. Pulmonary/Chest: Effort normal and breath sounds normal. No respiratory distress. She has no wheezes.  Abdominal: Soft. Bowel sounds are normal. She exhibits no distension. There is no tenderness.  Musculoskeletal: Normal range of motion. She exhibits no edema (no swelling noted) or tenderness.  Neurological: She is alert and oriented to person, place, and time. She has normal reflexes. No cranial nerve deficit.  Skin: Skin is warm and dry.  Psychiatric: She has a normal mood and affect. Her behavior is normal. Judgment and thought content normal.  Vitals reviewed.     BP 121/80   Pulse 82   Temp (!) 97.2 F (36.2 C) (Oral)   Ht 5\' 9"  (1.753 m)   Wt (!) 331 lb (150.1 kg)   BMI 48.88 kg/m      Assessment & Plan:  1. Peripheral edema - Compression stockings  2. Morbid obesity  with BMI of 45.0-49.9, adult (HCC) - Compression stockings  3. Current smoker - Compression stockings  Compression daily Low salt diet Elevate feet Smoking cessation discussed CMP normal on 06/18/16 RTO prn and keep chronic follow up  [name], FNP

## 2016-09-02 ENCOUNTER — Other Ambulatory Visit: Payer: Self-pay | Admitting: Family

## 2016-09-02 DIAGNOSIS — E119 Type 2 diabetes mellitus without complications: Secondary | ICD-10-CM

## 2016-10-01 ENCOUNTER — Other Ambulatory Visit: Payer: Self-pay | Admitting: Family

## 2016-10-01 DIAGNOSIS — F331 Major depressive disorder, recurrent, moderate: Secondary | ICD-10-CM

## 2016-10-01 DIAGNOSIS — F411 Generalized anxiety disorder: Secondary | ICD-10-CM

## 2016-10-03 NOTE — Telephone Encounter (Signed)
Valium refill left on voice mail of mail in pharmacy.

## 2016-10-15 ENCOUNTER — Other Ambulatory Visit: Payer: Self-pay | Admitting: *Deleted

## 2016-10-15 DIAGNOSIS — E119 Type 2 diabetes mellitus without complications: Secondary | ICD-10-CM

## 2016-10-31 ENCOUNTER — Other Ambulatory Visit: Payer: Self-pay

## 2016-10-31 DIAGNOSIS — F411 Generalized anxiety disorder: Secondary | ICD-10-CM

## 2016-10-31 DIAGNOSIS — F331 Major depressive disorder, recurrent, moderate: Secondary | ICD-10-CM

## 2016-10-31 NOTE — Telephone Encounter (Signed)
Last seen 07/22/16  Jasmine Peck  This is mail order so put in for print

## 2016-11-04 MED ORDER — DIAZEPAM 2 MG PO TABS: ORAL_TABLET | ORAL | 1 refills | Status: DC

## 2016-11-04 NOTE — Telephone Encounter (Signed)
Notified that prescription is available at the front desk for pickup with a photo ID.

## 2016-11-04 NOTE — Telephone Encounter (Signed)
RX ready for pick up 

## 2016-12-30 ENCOUNTER — Other Ambulatory Visit: Payer: Self-pay | Admitting: Family

## 2016-12-30 DIAGNOSIS — F331 Major depressive disorder, recurrent, moderate: Secondary | ICD-10-CM

## 2016-12-30 DIAGNOSIS — F411 Generalized anxiety disorder: Secondary | ICD-10-CM

## 2016-12-30 DIAGNOSIS — I10 Essential (primary) hypertension: Secondary | ICD-10-CM

## 2016-12-30 NOTE — Telephone Encounter (Signed)
Left message for patient to call to schedule a follow up and script is ready.

## 2016-12-30 NOTE — Telephone Encounter (Signed)
Patient NTBS for follow up and lab work. RX printed and ready for pick up

## 2016-12-30 NOTE — Telephone Encounter (Signed)
Last seen 07/22/16  Jasmine Peck   Print for mail order if approved

## 2017-01-15 DIAGNOSIS — Z23 Encounter for immunization: Secondary | ICD-10-CM | POA: Diagnosis not present

## 2017-01-15 DIAGNOSIS — B20 Human immunodeficiency virus [HIV] disease: Secondary | ICD-10-CM | POA: Diagnosis not present

## 2017-02-02 ENCOUNTER — Other Ambulatory Visit: Payer: Self-pay | Admitting: Family

## 2017-02-02 DIAGNOSIS — E119 Type 2 diabetes mellitus without complications: Secondary | ICD-10-CM

## 2017-02-02 NOTE — Telephone Encounter (Signed)
last seen 07/22/16  [facility]

## 2017-03-03 ENCOUNTER — Other Ambulatory Visit: Payer: Self-pay | Admitting: Family

## 2017-03-03 DIAGNOSIS — E119 Type 2 diabetes mellitus without complications: Secondary | ICD-10-CM

## 2017-03-03 DIAGNOSIS — I10 Essential (primary) hypertension: Secondary | ICD-10-CM

## 2017-03-03 NOTE — Telephone Encounter (Signed)
Last A1C 06/18/2016, last office visit 07/22/2016

## 2017-03-03 NOTE — Telephone Encounter (Signed)
Patient NTBS for follow up and lab work  

## 2017-03-19 ENCOUNTER — Encounter: Payer: Self-pay | Admitting: Family

## 2017-03-19 ENCOUNTER — Ambulatory Visit: Payer: Medicaid Other | Admitting: Family

## 2017-03-19 VITALS — BP 120/84 | HR 83 | Temp 98.5°F | Ht 69.0 in | Wt 335.0 lb

## 2017-03-19 DIAGNOSIS — E785 Hyperlipidemia, unspecified: Secondary | ICD-10-CM

## 2017-03-19 DIAGNOSIS — G4733 Obstructive sleep apnea (adult) (pediatric): Secondary | ICD-10-CM

## 2017-03-19 DIAGNOSIS — F411 Generalized anxiety disorder: Secondary | ICD-10-CM

## 2017-03-19 DIAGNOSIS — I10 Essential (primary) hypertension: Secondary | ICD-10-CM | POA: Diagnosis not present

## 2017-03-19 DIAGNOSIS — Z6841 Body Mass Index (BMI) 40.0 and over, adult: Secondary | ICD-10-CM

## 2017-03-19 DIAGNOSIS — E1169 Type 2 diabetes mellitus with other specified complication: Secondary | ICD-10-CM | POA: Insufficient documentation

## 2017-03-19 DIAGNOSIS — E1159 Type 2 diabetes mellitus with other circulatory complications: Secondary | ICD-10-CM

## 2017-03-19 DIAGNOSIS — B20 Human immunodeficiency virus [HIV] disease: Secondary | ICD-10-CM | POA: Diagnosis not present

## 2017-03-19 DIAGNOSIS — F331 Major depressive disorder, recurrent, moderate: Secondary | ICD-10-CM | POA: Diagnosis not present

## 2017-03-19 DIAGNOSIS — E559 Vitamin D deficiency, unspecified: Secondary | ICD-10-CM

## 2017-03-19 DIAGNOSIS — F17200 Nicotine dependence, unspecified, uncomplicated: Secondary | ICD-10-CM

## 2017-03-19 DIAGNOSIS — E1142 Type 2 diabetes mellitus with diabetic polyneuropathy: Secondary | ICD-10-CM

## 2017-03-19 LAB — BAYER DCA HB A1C WAIVED: HB A1C (BAYER DCA - WAIVED): 5.8 % (ref <7.0)

## 2017-03-19 MED ORDER — CITALOPRAM HYDROBROMIDE 40 MG PO TABS: 40.0000 mg | ORAL_TABLET | Freq: Every day | ORAL | 1 refills | Status: DC

## 2017-03-19 MED ORDER — GABAPENTIN 600 MG PO TABS: 600.0000 mg | ORAL_TABLET | Freq: Three times a day (TID) | ORAL | 1 refills | Status: DC

## 2017-03-19 MED ORDER — BLOOD GLUCOSE METER KIT: PACK | 0 refills | Status: DC

## 2017-03-19 NOTE — Progress Notes (Signed)
Subjective:    Patient ID: Jasmine Peck, female    DOB: 1975/05/12, 42 y.o.   MRN: [id number]  Pt presents to the office today for chronic follow up. Pt is HIV positive and is followed by Infectious Disease to manages that. Diabetes  She presents for her follow-up diabetic visit. She has type 2 diabetes mellitus. Her disease course has been stable. Hypoglycemia symptoms include nervousness/anxiousness. Associated symptoms include foot paresthesias. Pertinent negatives for diabetes include no blurred vision, no foot ulcerations and no visual change. Symptoms are stable. Diabetic complications include peripheral neuropathy. Pertinent negatives for diabetic complications include no CVA, heart disease or nephropathy. Risk factors for coronary artery disease include dyslipidemia, diabetes mellitus, hypertension, sedentary lifestyle, stress, tobacco exposure and family history. She is following a generally unhealthy diet. (Does not check BS at home ) Eye exam is not current.  Hypertension  This is a chronic problem. The current episode started more than 1 year ago. The problem has been resolved since onset. The problem is controlled. Associated symptoms include anxiety, malaise/fatigue and peripheral edema ("some times"). Pertinent negatives include no blurred vision or shortness of breath. Risk factors for coronary artery disease include dyslipidemia, diabetes mellitus, family history, obesity, smoking/tobacco exposure and sedentary lifestyle. The current treatment provides moderate improvement. There is no history of CAD/MI, CVA or heart failure.  Depression         This is a chronic problem.  The current episode started more than 1 year ago.   The onset quality is gradual.   The problem occurs intermittently.  The problem has been waxing and waning since onset.  Associated symptoms include insomnia, irritable, restlessness and sad.  Associated symptoms include no helplessness and no hopelessness.  Past  treatments include SSRIs - Selective serotonin reuptake inhibitors.  Compliance with treatment is good.  Past medical history includes anxiety.   Anxiety  Presents for follow-up visit. Symptoms include depressed mood, excessive worry, insomnia, irritability, nervous/anxious behavior and restlessness. Patient reports no shortness of breath. Symptoms occur most days. The severity of symptoms is moderate. The quality of sleep is good.    Hyperlipidemia  This is a chronic problem. The current episode started more than 1 year ago. The problem is uncontrolled. Recent lipid tests were reviewed and are high. Exacerbating diseases include obesity. Pertinent negatives include no shortness of breath. Current antihyperlipidemic treatment includes diet change. The current treatment provides mild improvement of lipids. Risk factors for coronary artery disease include diabetes mellitus, dyslipidemia, hypertension, a sedentary lifestyle and post-menopausal.  Diabetic Neuropathy PT complaining of burning and tinging pain in bilateral legs of 7 out 10.  OSA PT states she use to use a CPAP, but her machine has not worked in years.    Review of Systems  Constitutional: Positive for irritability and malaise/fatigue.  Eyes: Negative for blurred vision.  Respiratory: Negative for shortness of breath.   Psychiatric/Behavioral: Positive for depression. The patient is nervous/anxious and has insomnia.   All other systems reviewed and are negative.      Objective:   Physical Exam  Constitutional: She is oriented to person, place, and time. She appears well-developed and well-nourished. She is irritable. No distress.  morbid obese  HENT:  Head: Normocephalic and atraumatic.  Right Ear: External ear normal.  Left Ear: External ear normal.  Nose: Nose normal.  Mouth/Throat: Oropharynx is clear and moist.  Eyes: Pupils are equal, round, and reactive to light.  Neck: Normal range of motion. Neck supple. No  thyromegaly  present.  Cardiovascular: Normal rate, regular rhythm, normal heart sounds and intact distal pulses.  No murmur heard. Pulmonary/Chest: Effort normal and breath sounds normal. No respiratory distress. She has no wheezes.  Abdominal: Soft. Bowel sounds are normal. She exhibits no distension. There is no tenderness.  Musculoskeletal: Normal range of motion. She exhibits no edema or tenderness.  Neurological: She is alert and oriented to person, place, and time.  Skin: Skin is warm and dry.  Psychiatric: She has a normal mood and affect. Her behavior is normal. Judgment and thought content normal.  Vitals reviewed.   See diabetic foot note  BP 120/84   Pulse 83   Temp 98.5 F (36.9 C) (Oral)   Ht 5' 9"  (1.753 m)   Wt (!) 335 lb (152 kg)   BMI 49.47 kg/m      Assessment & Plan:  1. Hypertension associated with diabetes ([place]) - CMP14+EGFR  2. Type 2 diabetes mellitus with diabetic polyneuropathy, without long-term current use of insulin (HCC) - Bayer DCA Hb A1c Waived - CMP14+EGFR - Lipid panel - Microalbumin / creatinine urine ratio - Ambulatory referral to Ophthalmology  3. Diabetic peripheral neuropathy (HCC) Gabapentin increased to 600 mg TID from 300 mg BID - CMP14+EGFR - gabapentin (NEURONTIN) 600 MG tablet; Take 1 tablet (600 mg total) by mouth 3 (three) times daily.  Dispense: 270 tablet; Refill: 1 - Ambulatory referral to Ophthalmology  4. GAD (generalized anxiety disorder) Celexa increased to 40 mg and 20 mg Stress management  discussed  - CMP14+EGFR - citalopram (CELEXA) 40 MG tablet; Take 1 tablet (40 mg total) by mouth daily.  Dispense: 90 tablet; Refill: 1  5. Moderate episode of recurrent major depressive disorder (HCC) Celexa increased to 40 mg and 20 mg Stress management  discussed  - CMP14+EGFR - citalopram (CELEXA) 40 MG tablet; Take 1 tablet (40 mg total) by mouth daily.  Dispense: 90 tablet; Refill: 1  6. Human immunodeficiency virus  (HIV) disease ([place]) - CMP14+EGFR  7. Morbid obesity with BMI of 45.0-49.9, adult (HCC) - CMP14+EGFR  8. Vitamin D deficiency - CMP14+EGFR  9. Current smoker - CMP14+EGFR  10. Hyperlipidemia associated with type 2 diabetes mellitus (HCC) - CMP14+EGFR - Lipid panel  11. OSA (obstructive sleep apnea) - Ambulatory referral to Pulmonology   Continue all meds Labs pending Health Maintenance reviewed- Pt to schedule mammogram, referral Opth,  Diet and exercise encouraged RTO [place], FNP

## 2017-03-19 NOTE — Patient Instructions (Signed)

## 2017-03-20 ENCOUNTER — Telehealth: Payer: Self-pay | Admitting: Family

## 2017-03-20 ENCOUNTER — Other Ambulatory Visit: Payer: Self-pay | Admitting: Family

## 2017-03-20 DIAGNOSIS — R7989 Other specified abnormal findings of blood chemistry: Secondary | ICD-10-CM

## 2017-03-20 LAB — CMP14+EGFR
ALT: 10 IU/L (ref 0–32)
AST: 8 IU/L (ref 0–40)
Albumin/Globulin Ratio: 1.5 (ref 1.2–2.2)
Albumin: 4.3 g/dL (ref 3.5–5.5)
Alkaline Phosphatase: 86 IU/L (ref 39–117)
BILIRUBIN TOTAL: 0.3 mg/dL (ref 0.0–1.2)
BUN/Creatinine Ratio: 15 (ref 9–23)
BUN: 11 mg/dL (ref 6–24)
CALCIUM: 9.5 mg/dL (ref 8.7–10.2)
CHLORIDE: 105 mmol/L (ref 96–106)
CO2: 25 mmol/L (ref 20–29)
Creatinine, Ser: 0.75 mg/dL (ref 0.57–1.00)
GFR calc Af Amer: 114 mL/min/{1.73_m2} (ref >59)
GFR calc non Af Amer: 99 mL/min/{1.73_m2} (ref >59)
GLUCOSE: 71 mg/dL (ref 65–99)
Globulin, Total: 2.9 g/dL (ref 1.5–4.5)
Potassium: 5.7 mmol/L — ABNORMAL HIGH (ref 3.5–5.2)
Sodium: 143 mmol/L (ref 134–144)
Total Protein: 7.2 g/dL (ref 6.0–8.5)

## 2017-03-20 LAB — MICROALBUMIN / CREATININE URINE RATIO
Creatinine, Urine: 58.9 mg/dL
Microalb/Creat Ratio: <5.1 mg/g creat (ref 0.0–30.0)
Microalbumin, Urine: <3 ug/mL

## 2017-03-20 LAB — LIPID PANEL
Chol/HDL Ratio: 3.8 ratio (ref 0.0–4.4)
Cholesterol, Total: 163 mg/dL (ref 100–199)
HDL: 43 mg/dL (ref >39)
LDL Calculated: 98 mg/dL (ref 0–99)
Triglycerides: 109 mg/dL (ref 0–149)
VLDL CHOLESTEROL CAL: 22 mg/dL (ref 5–40)

## 2017-03-20 MED ORDER — ATORVASTATIN CALCIUM 10 MG PO TABS: 10.0000 mg | ORAL_TABLET | Freq: Every day | ORAL | 11 refills | Status: DC

## 2017-03-20 NOTE — Telephone Encounter (Signed)
Pt aware.

## 2017-03-21 ENCOUNTER — Other Ambulatory Visit: Payer: Medicaid Other

## 2017-03-21 DIAGNOSIS — R7989 Other specified abnormal findings of blood chemistry: Secondary | ICD-10-CM

## 2017-03-22 LAB — BMP8+EGFR
BUN / CREAT RATIO: 14 (ref 9–23)
BUN: 11 mg/dL (ref 6–24)
CO2: 24 mmol/L (ref 20–29)
CREATININE: 0.78 mg/dL (ref 0.57–1.00)
Calcium: 9 mg/dL (ref 8.7–10.2)
Chloride: 103 mmol/L (ref 96–106)
GFR calc Af Amer: 108 mL/min/{1.73_m2} (ref >59)
GFR calc non Af Amer: 94 mL/min/{1.73_m2} (ref >59)
Glucose: 167 mg/dL — ABNORMAL HIGH (ref 65–99)
Potassium: 4.6 mmol/L (ref 3.5–5.2)
Sodium: 142 mmol/L (ref 134–144)

## 2017-03-23 ENCOUNTER — Other Ambulatory Visit: Payer: Self-pay | Admitting: Family

## 2017-03-23 MED ORDER — GLUCOSE BLOOD VI STRP: ORAL_STRIP | 3 refills | Status: DC

## 2017-03-23 MED ORDER — ACCU-CHEK FASTCLIX LANCETS MISC: 3 refills | Status: DC

## 2017-03-23 NOTE — Telephone Encounter (Signed)
Refill sent.

## 2017-03-27 ENCOUNTER — Other Ambulatory Visit: Payer: Self-pay | Admitting: *Deleted

## 2017-03-27 MED ORDER — GLUCOSE BLOOD VI STRP: ORAL_STRIP | 3 refills | Status: DC

## 2017-03-27 MED ORDER — ACCU-CHEK FASTCLIX LANCETS MISC: 3 refills | Status: DC

## 2017-03-27 NOTE — Telephone Encounter (Signed)
Fax received [facility] Needing additional info/clarification Unable to verify patient Noted recent Rxs sent to [place] test strips & lancets to this pharmacy as well Cancelled refills with [facility]

## 2017-03-30 ENCOUNTER — Encounter: Payer: Self-pay | Admitting: Family

## 2017-03-30 ENCOUNTER — Ambulatory Visit (INDEPENDENT_AMBULATORY_CARE_PROVIDER_SITE_OTHER): Payer: Medicaid Other | Admitting: Family

## 2017-03-30 VITALS — BP 132/95 | HR 93 | Temp 98.4°F | Ht 69.0 in | Wt 335.0 lb

## 2017-03-30 DIAGNOSIS — J029 Acute pharyngitis, unspecified: Secondary | ICD-10-CM | POA: Diagnosis not present

## 2017-03-30 DIAGNOSIS — J0100 Acute maxillary sinusitis, unspecified: Secondary | ICD-10-CM

## 2017-03-30 LAB — RAPID STREP SCREEN (MED CTR MEBANE ONLY): Strep Gp A Ag, IA W/Reflex: POSITIVE — AB

## 2017-03-30 MED ORDER — DOXYCYCLINE HYCLATE 100 MG PO TABS: 100.0000 mg | ORAL_TABLET | Freq: Two times a day (BID) | ORAL | 0 refills | Status: DC

## 2017-03-30 NOTE — Progress Notes (Signed)
   Subjective:    Patient ID: Jasmine Peck, female    DOB: 09-09-1975, 42 y.o.   MRN: [id number]  Cough  This is a new problem. The current episode started 1 to 4 weeks ago. The problem has been gradually worsening. The problem occurs every few minutes. The cough is productive of sputum and productive of purulent sputum (thick green). Associated symptoms include chills, ear congestion, headaches, nasal congestion, postnasal drip, rhinorrhea, a sore throat, shortness of breath and wheezing. Pertinent negatives include no ear pain, fever or myalgias. The symptoms are aggravated by lying down. Risk factors for lung disease include smoking/tobacco exposure. She has tried rest, OTC cough suppressant and oral steroids (keflex) for the symptoms. The treatment provided mild relief.      Review of Systems  Constitutional: Positive for chills. Negative for fever.  HENT: Positive for postnasal drip, rhinorrhea and sore throat. Negative for ear pain.   Respiratory: Positive for cough, shortness of breath and wheezing.   Musculoskeletal: Negative for myalgias.  Neurological: Positive for headaches.  All other systems reviewed and are negative.      Objective:   Physical Exam  Constitutional: She is oriented to person, place, and time. She appears well-developed and well-nourished. No distress.  HENT:  Head: Normocephalic and atraumatic.  Right Ear: External ear normal.  Nose: Mucosal edema and rhinorrhea present.  Mouth/Throat: Posterior oropharyngeal edema and posterior oropharyngeal erythema present.  Eyes: Pupils are equal, round, and reactive to light.  Neck: Normal range of motion. Neck supple. No thyromegaly present.  Cardiovascular: Normal rate, regular rhythm, normal heart sounds and intact distal pulses.  No murmur heard. Pulmonary/Chest: Effort normal and breath sounds normal. No respiratory distress. She has no wheezes.  Abdominal: Soft. Bowel sounds are normal. She exhibits no  distension. There is no tenderness.  Musculoskeletal: Normal range of motion. She exhibits no edema or tenderness.  Neurological: She is alert and oriented to person, place, and time.  Skin: Skin is warm and dry.  Psychiatric: She has a normal mood and affect. Her behavior is normal. Judgment and thought content normal.  Vitals reviewed.     BP (!) 132/95   Pulse 93   Temp 98.4 F (36.9 C) (Oral)   Ht 5\' 9"  (1.753 m)   Wt (!) 335 lb (152 kg)   BMI 49.47 kg/m      Assessment & Plan:  1. Sore throat - Rapid Strep Screen (Not at [facility])  2. Acute maxillary sinusitis, recurrence not specified - Take meds as prescribed - Use a cool mist humidifier  -Use saline nose sprays frequently -Force fluids -For any cough or congestion  Use plain Mucinex- regular strength or max strength is fine -For fever or aces or pains- take tylenol or ibuprofen. -Throat lozenges if help -New toothbrush in 3 days - doxycycline (VIBRA-TABS) 100 MG tablet; Take 1 tablet (100 mg total) by mouth 2 (two) times daily.  Dispense: 20 tablet; Refill: 0    [name], FNP

## 2017-03-30 NOTE — Patient Instructions (Signed)

## 2017-04-02 ENCOUNTER — Other Ambulatory Visit: Payer: Self-pay | Admitting: Family

## 2017-04-02 DIAGNOSIS — J0100 Acute maxillary sinusitis, unspecified: Secondary | ICD-10-CM

## 2017-04-02 DIAGNOSIS — E119 Type 2 diabetes mellitus without complications: Secondary | ICD-10-CM

## 2017-04-02 DIAGNOSIS — I10 Essential (primary) hypertension: Secondary | ICD-10-CM

## 2017-04-03 NOTE — Telephone Encounter (Signed)
Last seen 03/30/17  Jasmine Peck 

## 2017-04-30 ENCOUNTER — Other Ambulatory Visit: Payer: Self-pay | Admitting: Family

## 2017-04-30 DIAGNOSIS — E119 Type 2 diabetes mellitus without complications: Secondary | ICD-10-CM

## 2017-06-01 ENCOUNTER — Other Ambulatory Visit: Payer: Self-pay | Admitting: Family

## 2017-06-01 DIAGNOSIS — I10 Essential (primary) hypertension: Secondary | ICD-10-CM

## 2017-06-03 ENCOUNTER — Other Ambulatory Visit: Payer: Self-pay | Admitting: Family

## 2017-07-24 ENCOUNTER — Other Ambulatory Visit: Payer: Self-pay | Admitting: *Deleted

## 2017-07-24 DIAGNOSIS — I10 Essential (primary) hypertension: Secondary | ICD-10-CM

## 2017-07-24 MED ORDER — AMLODIPINE BESYLATE 10 MG PO TABS: 10.0000 mg | ORAL_TABLET | Freq: Every day | ORAL | 0 refills | Status: DC

## 2017-08-03 ENCOUNTER — Other Ambulatory Visit: Payer: Self-pay | Admitting: Family

## 2017-08-03 DIAGNOSIS — E119 Type 2 diabetes mellitus without complications: Secondary | ICD-10-CM

## 2017-08-04 NOTE — Telephone Encounter (Signed)
Ov 09/21/17

## 2017-09-01 ENCOUNTER — Other Ambulatory Visit: Payer: Self-pay | Admitting: Family

## 2017-09-01 DIAGNOSIS — I10 Essential (primary) hypertension: Secondary | ICD-10-CM

## 2017-09-02 NOTE — Telephone Encounter (Signed)
OV 09/21/17 

## 2017-09-11 ENCOUNTER — Ambulatory Visit (INDEPENDENT_AMBULATORY_CARE_PROVIDER_SITE_OTHER): Payer: Medicaid Other | Admitting: Nurse Practitioner

## 2017-09-11 ENCOUNTER — Other Ambulatory Visit: Payer: Self-pay | Admitting: Family

## 2017-09-11 ENCOUNTER — Encounter: Payer: Self-pay | Admitting: Nurse Practitioner

## 2017-09-11 VITALS — BP 120/84 | HR 80 | Temp 96.8°F | Ht 69.0 in | Wt 310.0 lb

## 2017-09-11 DIAGNOSIS — D1724 Benign lipomatous neoplasm of skin and subcutaneous tissue of left leg: Secondary | ICD-10-CM | POA: Diagnosis not present

## 2017-09-11 DIAGNOSIS — F331 Major depressive disorder, recurrent, moderate: Secondary | ICD-10-CM

## 2017-09-11 DIAGNOSIS — E1142 Type 2 diabetes mellitus with diabetic polyneuropathy: Secondary | ICD-10-CM

## 2017-09-11 DIAGNOSIS — F411 Generalized anxiety disorder: Secondary | ICD-10-CM

## 2017-09-11 LAB — BMP8+EGFR
BUN / CREAT RATIO: 14 (ref 9–23)
BUN: 12 mg/dL (ref 6–24)
CHLORIDE: 105 mmol/L (ref 96–106)
CO2: 24 mmol/L (ref 20–29)
Calcium: 9.2 mg/dL (ref 8.7–10.2)
Creatinine, Ser: 0.86 mg/dL (ref 0.57–1.00)
GFR calc non Af Amer: 84 mL/min/{1.73_m2} (ref >59)
GFR, EST AFRICAN AMERICAN: 96 mL/min/{1.73_m2} (ref >59)
Glucose: 101 mg/dL — ABNORMAL HIGH (ref 65–99)
Potassium: 3.9 mmol/L (ref 3.5–5.2)
Sodium: 145 mmol/L — ABNORMAL HIGH (ref 134–144)

## 2017-09-11 MED ORDER — V-2 HIGH COMPRESSION HOSE MISC: 1.0000 | Freq: Every day | 0 refills | Status: DC

## 2017-09-11 NOTE — Progress Notes (Signed)
   Subjective:    Patient ID: Jasmine Peck, female    DOB: [birth year], 42 y.o.   MRN: [id number]   Chief Complaint: Knot on left leg and Cramps in legs at night time   HPI Patient come sin today c/o a knot tat arises on left lower anterior shin area during the day. She says that it resolves at night. She says that when she is working it gets rather big and causes pain to shoot up leg. She says that it is about as big around as a orange and sticks out about 1/4 of an inch. No reddness.   Review of Systems  Constitutional: Negative.   HENT: Negative.   Respiratory: Negative.   Cardiovascular: Negative.   Genitourinary: Negative.   Neurological: Negative.   Psychiatric/Behavioral: Negative.   All other systems reviewed and are negative.      Objective:   Physical Exam  Constitutional: She is oriented to person, place, and time. She appears well-developed and well-nourished. No distress.  Cardiovascular: Normal rate and regular rhythm.  Pulmonary/Chest: Effort normal.  Neurological: She is alert and oriented to person, place, and time.  Skin: Skin is warm.  6cm lipoma palpable on left anterior shin. nontender with erythema  Psychiatric: She has a normal mood and affect. Her behavior is normal. Thought content normal.   BP 120/84   Pulse 80   Temp (!) 96.8 F (36 C) (Oral)   Ht 5\' 9"  (1.753 m)   Wt (!) 310 lb (140.6 kg)   BMI 45.78 kg/m         Assessment & Plan:  Jasmine Peck in today with chief complaint of Knot on left leg and Cramps in legs at night time   1. Lipoma of left lower extremity Elevate legs when sitting - Elastic Bandages & Supports (V-2 HIGH COMPRESSION HOSE) MISC; 1 each by Does not apply route daily.  Dispense: 1 each; Refill: 0 -BMP today  Mary-Margaret Hassell Done, FNP

## 2017-09-11 NOTE — Patient Instructions (Signed)
Lipoma A lipoma is a noncancerous (benign) tumor that is made up of fat cells. This is a very common type of soft-tissue growth. Lipomas are usually found under the skin (subcutaneous). They may occur in any tissue of the body that contains fat. Common areas for lipomas to appear include the back, shoulders, buttocks, and thighs. Lipomas grow slowly, and they are usually painless. Most lipomas do not cause problems and do not require treatment. What are the causes? The cause of this condition is not known. What increases the risk? This condition is more likely to develop in:  People who are 40-60 years old.  People who have a family history of lipomas.  What are the signs or symptoms? A lipoma usually appears as a small, round bump under the skin. It may feel soft or rubbery, but the firmness can vary. Most lipomas are not painful. However, a lipoma may become painful if it is located in an area where it pushes on nerves. How is this diagnosed? A lipoma can usually be diagnosed with a physical exam. You may also have tests to confirm the diagnosis and to rule out other conditions. Tests may include:  Imaging tests, such as a CT scan or MRI.  Removal of a tissue sample to be looked at under a microscope (biopsy).  How is this treated? Treatment is not needed for small lipomas that are not causing problems. If a lipoma continues to get bigger or it causes problems, removal is often the best option. Lipomas can also be removed to improve appearance. Removal of a lipoma is usually done with a surgery in which the fatty cells and the surrounding capsule are removed. Most often, a medicine that numbs the area (local anesthetic) is used for this procedure. Follow these instructions at home:  Keep all follow-up visits as directed by your health care provider. This is important. Contact a health care provider if:  Your lipoma becomes larger or hard.  Your lipoma becomes painful, red, or  increasingly swollen. These could be signs of infection or a more serious condition. This information is not intended to replace advice given to you by your health care provider. Make sure you discuss any questions you have with your health care provider. Document Released: 12/20/2001 Document Revised: 06/07/2015 Document Reviewed: 12/26/2013 Elsevier Interactive Patient Education  2018 [employer]  

## 2017-09-21 ENCOUNTER — Encounter: Payer: Self-pay | Admitting: Family

## 2017-09-21 ENCOUNTER — Ambulatory Visit: Payer: Medicaid Other | Admitting: Family

## 2017-09-21 VITALS — BP 118/79 | HR 78 | Temp 97.5°F | Ht 69.0 in | Wt 315.2 lb

## 2017-09-21 DIAGNOSIS — E1142 Type 2 diabetes mellitus with diabetic polyneuropathy: Secondary | ICD-10-CM

## 2017-09-21 DIAGNOSIS — F411 Generalized anxiety disorder: Secondary | ICD-10-CM

## 2017-09-21 DIAGNOSIS — E559 Vitamin D deficiency, unspecified: Secondary | ICD-10-CM

## 2017-09-21 DIAGNOSIS — B070 Plantar wart: Secondary | ICD-10-CM | POA: Diagnosis not present

## 2017-09-21 DIAGNOSIS — E1169 Type 2 diabetes mellitus with other specified complication: Secondary | ICD-10-CM

## 2017-09-21 DIAGNOSIS — F331 Major depressive disorder, recurrent, moderate: Secondary | ICD-10-CM

## 2017-09-21 DIAGNOSIS — E1159 Type 2 diabetes mellitus with other circulatory complications: Secondary | ICD-10-CM

## 2017-09-21 DIAGNOSIS — B20 Human immunodeficiency virus [HIV] disease: Secondary | ICD-10-CM

## 2017-09-21 DIAGNOSIS — E119 Type 2 diabetes mellitus without complications: Secondary | ICD-10-CM | POA: Diagnosis not present

## 2017-09-21 DIAGNOSIS — F17200 Nicotine dependence, unspecified, uncomplicated: Secondary | ICD-10-CM | POA: Diagnosis not present

## 2017-09-21 DIAGNOSIS — E785 Hyperlipidemia, unspecified: Secondary | ICD-10-CM

## 2017-09-21 DIAGNOSIS — Z6841 Body Mass Index (BMI) 40.0 and over, adult: Secondary | ICD-10-CM

## 2017-09-21 DIAGNOSIS — K219 Gastro-esophageal reflux disease without esophagitis: Secondary | ICD-10-CM

## 2017-09-21 DIAGNOSIS — I10 Essential (primary) hypertension: Secondary | ICD-10-CM

## 2017-09-21 LAB — CMP14+EGFR
ALT: 11 IU/L (ref 0–32)
AST: 9 IU/L (ref 0–40)
Albumin/Globulin Ratio: 1.9 (ref 1.2–2.2)
Albumin: 4.2 g/dL (ref 3.5–5.5)
Alkaline Phosphatase: 86 IU/L (ref 39–117)
BUN/Creatinine Ratio: 13 (ref 9–23)
BUN: 10 mg/dL (ref 6–24)
Bilirubin Total: 0.4 mg/dL (ref 0.0–1.2)
CALCIUM: 9 mg/dL (ref 8.7–10.2)
CO2: 23 mmol/L (ref 20–29)
CREATININE: 0.79 mg/dL (ref 0.57–1.00)
Chloride: 106 mmol/L (ref 96–106)
GFR calc Af Amer: 107 mL/min/{1.73_m2} (ref >59)
GFR, EST NON AFRICAN AMERICAN: 93 mL/min/{1.73_m2} (ref >59)
Globulin, Total: 2.2 g/dL (ref 1.5–4.5)
Glucose: 105 mg/dL — ABNORMAL HIGH (ref 65–99)
Potassium: 4.1 mmol/L (ref 3.5–5.2)
Sodium: 143 mmol/L (ref 134–144)
Total Protein: 6.4 g/dL (ref 6.0–8.5)

## 2017-09-21 LAB — BAYER DCA HB A1C WAIVED: HB A1C (BAYER DCA - WAIVED): 6.1 % (ref <7.0)

## 2017-09-21 MED ORDER — AMLODIPINE BESYLATE 10 MG PO TABS: 10.0000 mg | ORAL_TABLET | Freq: Every day | ORAL | 3 refills | Status: DC

## 2017-09-21 MED ORDER — ATORVASTATIN CALCIUM 10 MG PO TABS: 10.0000 mg | ORAL_TABLET | Freq: Every day | ORAL | 3 refills | Status: DC

## 2017-09-21 MED ORDER — METFORMIN HCL 500 MG PO TABS: ORAL_TABLET | ORAL | 3 refills | Status: DC

## 2017-09-21 MED ORDER — FUROSEMIDE 20 MG PO TABS: 20.0000 mg | ORAL_TABLET | Freq: Every day | ORAL | 3 refills | Status: DC

## 2017-09-21 NOTE — Progress Notes (Signed)
Subjective:    Patient ID: Jasmine Peck, female    DOB: 07-21-1975, 42 y.o.   MRN: [id number]  Chief Complaint  Patient presents with  . Medical Management of Chronic Issues    six month recheck   Pt presents to the office today for chronic follow up. Pt is HIV positive and is followed by Infectious Disease every 6 months. Pt complaining a "sore spot" on her right heel that noticed about a month ago that is unchanged.  Hyperlipidemia  This is a chronic problem. The current episode started more than 1 year ago. The problem is controlled. Recent lipid tests were reviewed and are normal. Exacerbating diseases include obesity. Pertinent negatives include no shortness of breath. Current antihyperlipidemic treatment includes statins. The current treatment provides moderate improvement of lipids. Risk factors for coronary artery disease include dyslipidemia, hypertension, a sedentary lifestyle and post-menopausal.  Hypertension  This is a chronic problem. The current episode started more than 1 year ago. The problem has been resolved since onset. The problem is controlled. Associated symptoms include malaise/fatigue. Pertinent negatives include no blurred vision, peripheral edema or shortness of breath. Risk factors for coronary artery disease include dyslipidemia, obesity and sedentary lifestyle. The current treatment provides moderate improvement. There is no history of CAD/MI, CVA or heart failure.  Anxiety  Presents for follow-up visit. Symptoms include depressed mood, excessive worry and restlessness. Patient reports no insomnia, irritability or shortness of breath. Symptoms occur occasionally. The quality of sleep is good.    Depression         This is a chronic problem.  The current episode started more than 1 year ago.   The onset quality is gradual.   The problem occurs intermittently.  The problem has been waxing and waning since onset.  Associated symptoms include irritable,  restlessness, decreased interest and sad.  Associated symptoms include no helplessness, no hopelessness and does not have insomnia.  Past treatments include SSRIs - Selective serotonin reuptake inhibitors. Diabetes  She presents for her follow-up diabetic visit. She has type 2 diabetes mellitus. Her disease course has been stable. There are no hypoglycemic associated symptoms. Associated symptoms include foot paresthesias. Pertinent negatives for diabetes include no blurred vision and no visual change. There are no hypoglycemic complications. Symptoms are stable. There are no diabetic complications. Pertinent negatives for diabetic complications include no CVA. Risk factors for coronary artery disease include dyslipidemia, diabetes mellitus, hypertension and sedentary lifestyle. She is following a generally unhealthy diet. Her overall blood glucose range is 90-110 mg/dl. Eye exam is not current.  Diabetic Neuropathic  PT states her pain is greatly improved since taking her Gabapentin 600 mg TID.    Review of Systems  Constitutional: Positive for malaise/fatigue. Negative for irritability.  Eyes: Negative for blurred vision.  Respiratory: Negative for shortness of breath.   Psychiatric/Behavioral: Positive for depression. The patient does not have insomnia.   All other systems reviewed and are negative.      Objective:   Physical Exam  Constitutional: She is oriented to person, place, and time. She appears well-developed and well-nourished. She is irritable. No distress.  morbid obese  HENT:  Head: Normocephalic and atraumatic.  Right Ear: External ear normal.  Left Ear: External ear normal.  Mouth/Throat: Oropharynx is clear and moist.  Eyes: Pupils are equal, round, and reactive to light.  Neck: Normal range of motion. Neck supple. No thyromegaly present.  Cardiovascular: Normal rate, regular rhythm, normal heart sounds and intact distal pulses.  No  murmur heard. Pulmonary/Chest:  Effort normal and breath sounds normal. No respiratory distress. She has no wheezes.  Abdominal: Soft. Bowel sounds are normal. She exhibits no distension. There is no tenderness.  Musculoskeletal: Normal range of motion. She exhibits no edema or tenderness.  Neurological: She is alert and oriented to person, place, and time. She has normal reflexes. No cranial nerve deficit.  Skin: Skin is warm and dry.  Psychiatric: She has a normal mood and affect. Her behavior is normal. Judgment and thought content normal.  Vitals reviewed.  Cryothearpy used on right heel. Pt tolerated well.    BP 118/79   Pulse 78   Temp (!) 97.5 F (36.4 C) (Oral)   Ht 5' 9"  (1.753 m)   Wt (!) 315 lb 3.2 oz (143 kg)   BMI 46.55 kg/m      Assessment & Plan:  Jasmine Peck comes in today with chief complaint of Medical Management of Chronic Issues (six month recheck)   Diagnosis and orders addressed:  1. Essential hypertension - amLODipine (NORVASC) 10 MG tablet; Take 1 tablet (10 mg total) by mouth daily.  Dispense: 90 tablet; Refill: 3 - furosemide (LASIX) 20 MG tablet; Take 1 tablet (20 mg total) by mouth daily.  Dispense: 90 tablet; Refill: 3 - CMP14+EGFR  2. Type 2 diabetes mellitus without complication, without long-term current use of insulin (HCC) - metFORMIN (GLUCOPHAGE) 500 MG tablet; TAKE TWO TABLETS BY MOUTH 2 TIMES A DAY WITH MEALS  Dispense: 360 tablet; Refill: 3 - Bayer DCA Hb A1c Waived - CMP14+EGFR  3. Current smoker - CMP14+EGFR  4. Moderate episode of recurrent major depressive disorder (HCC) - CMP14+EGFR  5. Diabetic peripheral neuropathy (HCC) - CMP14+EGFR  6. GAD (generalized anxiety disorder) - CMP14+EGFR  7. Vitamin D deficiency - CMP14+EGFR  8. Morbid obesity with BMI of 45.0-49.9, adult ([place]) - CMP14+EGFR  9. Hypertension associated with diabetes ([place]) - CMP14+EGFR  10. Hyperlipidemia associated with type 2 diabetes mellitus (HCC) - CMP14+EGFR  11. Human  immunodeficiency virus (HIV) disease ([place]) - CMP14+EGFR  12. Gastroesophageal reflux disease, esophagitis presence not specified - CMP14+EGFR  13. Plantar wart   Labs pending Health Maintenance reviewed Diet and exercise encouraged  Follow up plan: 6 months    [name], FNP

## 2017-09-21 NOTE — Patient Instructions (Signed)
Plantar Warts Plantar warts are small growths on the bottom of the foot (sole). Warts are caused by a type of germ (virus). Most warts are not painful, and they usually do not cause problems. Sometimes, plantar warts can cause pain when you walk. Warts often go away on their own in time. Treatments may be done if needed. Follow these instructions at home: General instructions  Apply creams or solutions only as told by your doctor. Follow these steps if your doctor tells you to do so: ? Soak your foot in warm water. ? Remove the top layer of softened skin before you apply the medicine. You can use a pumice stone to remove the tissue. ? After you apply the medicine, put a bandage over the area of the wart. ? Repeat the process every day or as told by your doctor.  Do not scratch or pick at a wart.  Wash your hands after you touch a wart.  If a wart is painful, try putting a bandage with a hole in the middle over the wart.  Keep all follow-up visits as told by your doctor. This is important. Prevention  Wear shoes and socks. Change socks every day.  Keep your feet clean and dry.  Check your feet often.  Avoid direct contact with warts on other people. Contact a doctor if:  Your warts do not improve after treatment.  You have redness, swelling, or pain at the site of a wart.  You have bleeding from a wart, and the bleeding does not stop when you put light pressure on the wart.  You have diabetes and you get a wart. This information is not intended to replace advice given to you by your health care provider. Make sure you discuss any questions you have with your health care provider. Document Released: 02/01/2010 Document Revised: 06/07/2015 Document Reviewed: 03/27/2014 Elsevier Interactive Patient Education  2018 [employer]vier Inc[employer]  

## 2017-10-14 ENCOUNTER — Other Ambulatory Visit: Payer: Self-pay | Admitting: Family

## 2017-10-14 DIAGNOSIS — E1142 Type 2 diabetes mellitus with diabetic polyneuropathy: Secondary | ICD-10-CM

## 2017-10-29 ENCOUNTER — Other Ambulatory Visit: Payer: Self-pay | Admitting: Family

## 2017-11-04 ENCOUNTER — Other Ambulatory Visit: Payer: Self-pay | Admitting: Family

## 2017-11-04 DIAGNOSIS — I10 Essential (primary) hypertension: Secondary | ICD-10-CM

## 2017-11-22 DIAGNOSIS — N649 Disorder of breast, unspecified: Secondary | ICD-10-CM | POA: Diagnosis not present

## 2017-11-24 ENCOUNTER — Encounter: Payer: Self-pay | Admitting: Family

## 2017-11-24 ENCOUNTER — Other Ambulatory Visit: Payer: Self-pay | Admitting: Family

## 2017-11-24 ENCOUNTER — Ambulatory Visit
Admission: RE | Admit: 2017-11-24 | Discharge: 2017-11-24 | Disposition: A | Discharge status: Home / Self Care | Payer: Medicaid Other | Source: Ambulatory Visit | Attending: Family | Admitting: Family

## 2017-11-24 ENCOUNTER — Ambulatory Visit: Payer: Medicaid Other | Admitting: Family

## 2017-11-24 VITALS — BP 117/75 | HR 84 | Temp 97.1°F | Ht 69.0 in | Wt 302.4 lb

## 2017-11-24 DIAGNOSIS — N6320 Unspecified lump in the left breast, unspecified quadrant: Secondary | ICD-10-CM

## 2017-11-24 DIAGNOSIS — N63 Unspecified lump in unspecified breast: Secondary | ICD-10-CM

## 2017-11-24 DIAGNOSIS — Z6841 Body Mass Index (BMI) 40.0 and over, adult: Secondary | ICD-10-CM

## 2017-11-24 DIAGNOSIS — N6489 Other specified disorders of breast: Secondary | ICD-10-CM | POA: Diagnosis not present

## 2017-11-24 DIAGNOSIS — R928 Other abnormal and inconclusive findings on diagnostic imaging of breast: Secondary | ICD-10-CM | POA: Diagnosis not present

## 2017-11-24 DIAGNOSIS — J018 Other acute sinusitis: Secondary | ICD-10-CM

## 2017-11-24 DIAGNOSIS — G4733 Obstructive sleep apnea (adult) (pediatric): Secondary | ICD-10-CM

## 2017-11-24 DIAGNOSIS — I808 Phlebitis and thrombophlebitis of other sites: Secondary | ICD-10-CM

## 2017-11-24 DIAGNOSIS — N6341 Unspecified lump in right breast, subareolar: Secondary | ICD-10-CM | POA: Diagnosis not present

## 2017-11-24 DIAGNOSIS — J019 Acute sinusitis, unspecified: Secondary | ICD-10-CM

## 2017-11-24 MED ORDER — AMOXICILLIN-POT CLAVULANATE 875-125 MG PO TABS: 1.0000 | ORAL_TABLET | Freq: Two times a day (BID) | ORAL | 0 refills | Status: DC

## 2017-11-24 NOTE — Patient Instructions (Signed)

## 2017-11-24 NOTE — Progress Notes (Signed)
Subjective:    Patient ID: Jasmine Peck, female    DOB: [birth year], 42 y.o.   MRN: [id number]  Chief Complaint  Patient presents with  . Follow-up    lump right breast with indention for over a month   Pt presents to the office today with a nodule in right breast she noticed a nodule about a month ago. She thought this was related to hormones because she noticed tenderness. She noticed an indention over a few days ago.    She went to the ED on Sunday and was told she needed an referral asap.   She also states she never heard from her referral about her sleep study. She has OSA, but states her machine dose not work because she has had it for so long.  Sinusitis  This is a new problem. The current episode started 1 to 4 weeks ago. The problem has been gradually worsening since onset. The pain is moderate. Associated symptoms include congestion, ear pain, headaches, a hoarse voice and sinus pressure. Past treatments include oral decongestants. The treatment provided mild relief.       Review of Systems  HENT: Positive for congestion, ear pain, hoarse voice and sinus pressure.   Neurological: Positive for headaches.  All other systems reviewed and are negative.  Family History  Problem Relation Age of Onset  . Heart disease Mother   . Diabetes Mother   . Hypertension Mother   . Early death Father   . Diabetes Father   . Hypertension Father   . Diabetes Brother   . Hypertension Brother   . Hyperlipidemia Brother   . Heart disease Maternal Grandmother   . Diabetes Maternal Grandmother   . Diabetes Paternal Grandfather   . Heart disease Paternal Grandfather     Social History   Socioeconomic History  . Marital status: Divorced    Spouse name: Not on file  . Number of children: Not on file  . Years of education: Not on file  . Highest education level: Not on file  Occupational History  . Not on file  Social Needs  . Financial resource strain: Not on file  . Food  insecurity:    Worry: Not on file    Inability: Not on file  . Transportation needs:    Medical: Not on file    Non-medical: Not on file  Tobacco Use  . Smoking status: Current Every Day Smoker  . Smokeless tobacco: Never Used  Substance and Sexual Activity  . Alcohol use: No    Alcohol/week: 0.0 standard drinks  . Drug use: No  . Sexual activity: Never  Lifestyle  . Physical activity:    Days per week: Not on file    Minutes per session: Not on file  . Stress: Not on file  Relationships  . Social connections:    Talks on phone: Not on file    Gets together: Not on file    Attends religious service: Not on file    Active member of club or organization: Not on file    Attends meetings of clubs or organizations: Not on file    Relationship status: Not on file  Other Topics Concern  . Not on file  Social History Narrative  . Not on file       Objective:   Physical Exam  Constitutional: She is oriented to person, place, and time. She appears well-developed and well-nourished. No distress.  HENT:  Head: Normocephalic and atraumatic.  Right  Ear: External ear normal. Tympanic membrane is erythematous.  Left Ear: External ear normal. Tympanic membrane is erythematous.  Nose: Mucosal edema and rhinorrhea present. Right sinus exhibits maxillary sinus tenderness. Left sinus exhibits maxillary sinus tenderness.  Mouth/Throat: Posterior oropharyngeal edema and posterior oropharyngeal erythema present. Tonsils are 2+ on the right. Tonsils are 2+ on the left.  Eyes: Pupils are equal, round, and reactive to light.  Neck: Normal range of motion. Neck supple. No thyromegaly present.  Cardiovascular: Normal rate, regular rhythm, normal heart sounds and intact distal pulses.  No murmur heard. Pulmonary/Chest: Effort normal and breath sounds normal. No respiratory distress. She has no wheezes. Right breast exhibits mass and tenderness.    Abdominal: Soft. Bowel sounds are normal. She  exhibits no distension. There is no tenderness.  Musculoskeletal: Normal range of motion. She exhibits no edema or tenderness.  Neurological: She is alert and oriented to person, place, and time. She has normal reflexes. No cranial nerve deficit.  Skin: Skin is warm and dry.  Psychiatric: She has a normal mood and affect. Her behavior is normal. Judgment and thought content normal.  Vitals reviewed.   BP 117/75   Pulse 84   Temp (!) 97.1 F (36.2 C) (Oral)   Ht 5\' 9"  (1.753 m)   Wt (!) 302 lb 6.4 oz (137.2 kg)   BMI 44.66 kg/m      Assessment & Plan:  Jasmine Peck comes in today with chief complaint of Follow-up (lump right breast with indention for over a month)   Diagnosis and orders addressed:  1. OSA (obstructive sleep apnea) Will do referral to Pulmonologist for Sleep study  - Ambulatory referral to Pulmonology  2. Lump, breast - MM DIAG BREAST TOMO BILATERAL; Future - US BREAST LTD UNI RIGHT INC AXILLA; Future  3. Acute sinusitis, recurrence not specified, unspecified location - Take meds as prescribed - Use a cool mist humidifier  -Use saline nose sprays frequently -Force fluids -For any cough or congestion  Use plain Mucinex- regular strength or max strength is fine -For fever or aces or pains- take tylenol or ibuprofen. -Throat lozenges if help -New toothbrush in 3 days RTO if symptoms worsen - amoxicillin-clavulanate (AUGMENTIN) 875-125 MG tablet; Take 1 tablet by mouth 2 (two) times Jasmine.  Dispense: 14 tablet; Refill: 0  4. Morbid obesity with BMI of 45.0-49.9, adult ([place])    Follow up plan: Stat referral for diagnostic mammogram    [name], FNP

## 2017-11-25 ENCOUNTER — Other Ambulatory Visit: Payer: Self-pay

## 2017-12-07 DIAGNOSIS — Z23 Encounter for immunization: Secondary | ICD-10-CM | POA: Diagnosis not present

## 2017-12-09 ENCOUNTER — Other Ambulatory Visit: Payer: Self-pay | Admitting: Family

## 2018-01-01 ENCOUNTER — Institutional Professional Consult (permissible substitution): Payer: Medicaid Other | Admitting: Pulmonary Disease

## 2018-01-04 ENCOUNTER — Other Ambulatory Visit: Payer: Self-pay | Admitting: Family

## 2018-01-04 DIAGNOSIS — E1142 Type 2 diabetes mellitus with diabetic polyneuropathy: Secondary | ICD-10-CM

## 2018-01-05 ENCOUNTER — Other Ambulatory Visit: Payer: Medicaid Other

## 2018-01-07 NOTE — Telephone Encounter (Signed)
OV 09/21/17 rtc 6 mos

## 2018-02-03 ENCOUNTER — Other Ambulatory Visit: Payer: Self-pay | Admitting: Family

## 2018-02-03 DIAGNOSIS — E1142 Type 2 diabetes mellitus with diabetic polyneuropathy: Secondary | ICD-10-CM

## 2018-02-04 NOTE — Telephone Encounter (Signed)
OV 09/21/17 rtc 6 mos

## 2018-02-18 DIAGNOSIS — E119 Type 2 diabetes mellitus without complications: Secondary | ICD-10-CM | POA: Diagnosis not present

## 2018-02-18 DIAGNOSIS — B20 Human immunodeficiency virus [HIV] disease: Secondary | ICD-10-CM | POA: Diagnosis not present

## 2018-03-23 ENCOUNTER — Ambulatory Visit: Payer: Medicaid Other | Admitting: Family

## 2018-03-26 ENCOUNTER — Ambulatory Visit: Payer: Medicaid Other | Admitting: Podiatry

## 2018-03-26 ENCOUNTER — Other Ambulatory Visit: Payer: Self-pay

## 2018-03-26 ENCOUNTER — Ambulatory Visit: Payer: Medicaid Other

## 2018-03-26 ENCOUNTER — Other Ambulatory Visit: Payer: Self-pay | Admitting: Podiatry

## 2018-03-26 ENCOUNTER — Ambulatory Visit (INDEPENDENT_AMBULATORY_CARE_PROVIDER_SITE_OTHER): Payer: Medicaid Other

## 2018-03-26 ENCOUNTER — Encounter: Payer: Self-pay | Admitting: Podiatry

## 2018-03-26 VITALS — BP 120/76 | HR 87

## 2018-03-26 DIAGNOSIS — M722 Plantar fascial fibromatosis: Secondary | ICD-10-CM

## 2018-03-26 DIAGNOSIS — M25371 Other instability, right ankle: Secondary | ICD-10-CM

## 2018-03-26 DIAGNOSIS — M7671 Peroneal tendinitis, right leg: Secondary | ICD-10-CM

## 2018-03-26 DIAGNOSIS — M778 Other enthesopathies, not elsewhere classified: Secondary | ICD-10-CM

## 2018-03-26 DIAGNOSIS — M25571 Pain in right ankle and joints of right foot: Secondary | ICD-10-CM | POA: Diagnosis not present

## 2018-03-26 DIAGNOSIS — M779 Enthesopathy, unspecified: Principal | ICD-10-CM

## 2018-03-26 MED ORDER — METHYLPREDNISOLONE 4 MG PO TBPK: ORAL_TABLET | ORAL | 0 refills | Status: DC

## 2018-03-26 MED ORDER — MELOXICAM 15 MG PO TABS: 15.0000 mg | ORAL_TABLET | Freq: Every day | ORAL | 1 refills | Status: AC

## 2018-03-29 NOTE — Progress Notes (Signed)
   Subjective: 43 year old female with PMHx of DM presenting today as a new patient with a chief complaint of bilateral foot and ankle pain that began about four months ago. She states she has a feeling of pins and needles in the left heel. She reports burning pain to the lateral aspect of the right foot and states the ankle feels weak. Touching the areas and ambulating increases the pain. She has not done anything for treatment. Patient is here for further evaluation and treatment.   Past Medical History:  Diagnosis Date  . Anxiety   . Depression   . Diabetes mellitus without complication ([place])   . GERD (gastroesophageal reflux disease)   . HIV infection ([place])   . Hypertension      Objective: Physical Exam General: The patient is alert and oriented x3 in no acute distress.  Dermatology: Dry skin and fissuring noted to the left heel. Skin is warm, dry and supple bilateral lower extremities. Negative for open lesions or macerations bilateral.   Vascular: Dorsalis Pedis and Posterior Tibial pulses palpable bilateral.  Capillary fill time is immediate to all digits.  Neurological: Epicritic and protective threshold intact bilateral.   Musculoskeletal: Tenderness to palpation to the plantar aspect of the left heel along the plantar fascia as well as to the insertion of the peroneal tendon of the right foot. All other joints range of motion within normal limits bilateral. Strength 5/5 in all groups bilateral.   Radiographic exam:  Normal osseous mineralization. Joint spaces preserved. No fracture/dislocation/boney destruction. No other soft tissue abnormalities or radiopaque foreign bodies.   Assessment: 1. Plantar fasciitis left foot 2. Insertional peroneal tendinitis right  3. Ankle instability right  4. Dry skin / fissuring left heel   Plan of Care:  1. Patient evaluated. Xrays reviewed.   2. Declined injections.   3. Rx for Medrol Dose Pak placed 4. Rx for Meloxicam ordered  for patient. 5. Plantar fascial band(s) dispensed  6. Instructed patient regarding therapies and modalities at home to alleviate symptoms.  7. Ankle brace dispensed.  8. Recommended urea 40 % cream to left heel.  9. Return to clinic as needed.     Edrick Kins, DPM [facility]  Dr. Edrick Kins, DPM    [address], [place] [address]                Office [phone]  Fax [phone]

## 2018-04-01 ENCOUNTER — Encounter: Payer: Self-pay | Admitting: Family

## 2018-05-03 ENCOUNTER — Other Ambulatory Visit: Payer: Self-pay | Admitting: Family

## 2018-05-03 DIAGNOSIS — E1142 Type 2 diabetes mellitus with diabetic polyneuropathy: Secondary | ICD-10-CM

## 2018-05-07 ENCOUNTER — Other Ambulatory Visit: Payer: Self-pay | Admitting: *Deleted

## 2018-05-07 MED ORDER — ACCU-CHEK FASTCLIX LANCETS MISC: 3 refills | Status: DC

## 2018-05-17 ENCOUNTER — Other Ambulatory Visit: Payer: Self-pay | Admitting: Family

## 2018-05-17 DIAGNOSIS — I10 Essential (primary) hypertension: Secondary | ICD-10-CM

## 2018-05-21 ENCOUNTER — Other Ambulatory Visit: Payer: Self-pay | Admitting: Family

## 2018-05-21 DIAGNOSIS — I10 Essential (primary) hypertension: Secondary | ICD-10-CM | POA: Diagnosis not present

## 2018-05-21 DIAGNOSIS — Z760 Encounter for issue of repeat prescription: Secondary | ICD-10-CM | POA: Diagnosis not present

## 2018-05-27 ENCOUNTER — Other Ambulatory Visit: Payer: Self-pay

## 2018-05-27 ENCOUNTER — Ambulatory Visit
Admission: RE | Admit: 2018-05-27 | Discharge: 2018-05-27 | Disposition: A | Discharge status: Home / Self Care | Payer: Medicaid Other | Source: Ambulatory Visit | Attending: Family | Admitting: Family

## 2018-05-27 ENCOUNTER — Other Ambulatory Visit: Payer: Self-pay | Admitting: Family

## 2018-05-27 ENCOUNTER — Inpatient Hospital Stay: Admission: RE | Admit: 2018-05-27 | Payer: Medicaid Other | Source: Ambulatory Visit

## 2018-05-27 DIAGNOSIS — N6320 Unspecified lump in the left breast, unspecified quadrant: Secondary | ICD-10-CM

## 2018-05-27 DIAGNOSIS — I808 Phlebitis and thrombophlebitis of other sites: Secondary | ICD-10-CM

## 2018-05-28 ENCOUNTER — Encounter: Payer: Self-pay | Admitting: Family

## 2018-05-28 ENCOUNTER — Ambulatory Visit (INDEPENDENT_AMBULATORY_CARE_PROVIDER_SITE_OTHER): Payer: Medicaid Other | Admitting: Family

## 2018-05-28 DIAGNOSIS — K219 Gastro-esophageal reflux disease without esophagitis: Secondary | ICD-10-CM

## 2018-05-28 DIAGNOSIS — E785 Hyperlipidemia, unspecified: Secondary | ICD-10-CM

## 2018-05-28 DIAGNOSIS — F331 Major depressive disorder, recurrent, moderate: Secondary | ICD-10-CM

## 2018-05-28 DIAGNOSIS — F17200 Nicotine dependence, unspecified, uncomplicated: Secondary | ICD-10-CM | POA: Diagnosis not present

## 2018-05-28 DIAGNOSIS — I152 Hypertension secondary to endocrine disorders: Secondary | ICD-10-CM

## 2018-05-28 DIAGNOSIS — E1142 Type 2 diabetes mellitus with diabetic polyneuropathy: Secondary | ICD-10-CM | POA: Diagnosis not present

## 2018-05-28 DIAGNOSIS — E119 Type 2 diabetes mellitus without complications: Secondary | ICD-10-CM

## 2018-05-28 DIAGNOSIS — F411 Generalized anxiety disorder: Secondary | ICD-10-CM

## 2018-05-28 DIAGNOSIS — Z6841 Body Mass Index (BMI) 40.0 and over, adult: Secondary | ICD-10-CM

## 2018-05-28 DIAGNOSIS — E1159 Type 2 diabetes mellitus with other circulatory complications: Secondary | ICD-10-CM

## 2018-05-28 DIAGNOSIS — I10 Essential (primary) hypertension: Secondary | ICD-10-CM

## 2018-05-28 DIAGNOSIS — B20 Human immunodeficiency virus [HIV] disease: Secondary | ICD-10-CM

## 2018-05-28 DIAGNOSIS — E1169 Type 2 diabetes mellitus with other specified complication: Secondary | ICD-10-CM

## 2018-05-28 MED ORDER — ATORVASTATIN CALCIUM 10 MG PO TABS: 10.0000 mg | ORAL_TABLET | Freq: Every day | ORAL | 3 refills | Status: DC

## 2018-05-28 MED ORDER — AMLODIPINE BESYLATE 10 MG PO TABS: 10.0000 mg | ORAL_TABLET | Freq: Every day | ORAL | 3 refills | Status: DC

## 2018-05-28 MED ORDER — LISINOPRIL 40 MG PO TABS: ORAL_TABLET | ORAL | 0 refills | Status: DC

## 2018-05-28 MED ORDER — GABAPENTIN 600 MG PO TABS: ORAL_TABLET | ORAL | 3 refills | Status: DC

## 2018-05-28 MED ORDER — OMEPRAZOLE 20 MG PO CPDR: 20.0000 mg | DELAYED_RELEASE_CAPSULE | Freq: Every day | ORAL | 0 refills | Status: DC

## 2018-05-28 MED ORDER — METFORMIN HCL 500 MG PO TABS: ORAL_TABLET | ORAL | 3 refills | Status: DC

## 2018-05-28 MED ORDER — FUROSEMIDE 20 MG PO TABS: 20.0000 mg | ORAL_TABLET | Freq: Every day | ORAL | 3 refills | Status: DC

## 2018-05-28 NOTE — Progress Notes (Signed)
Virtual Visit via telephone Note  I connected with Jasmine Peck on 05/28/18 at 11:58 Am by telephone and verified that I am speaking with the correct person using two identifiers. Jasmine Peck is currently located at driving  and mother is currently with her during visit. The provider, [name], FNP is located in their office at time of visit.  I discussed the limitations, risks, security and privacy concerns of performing an evaluation and management service by telephone and the availability of in person appointments. I also discussed with the patient that there may be a patient responsible charge related to this service. The patient expressed understanding and agreed to proceed.   History and Present Illness:  Pt presents to the office today for chronic follow up. Pt is HIV positive andis followed by Infectious Diseaseevery 6 months.  Diabetes  She presents for her follow-up diabetic visit. She has type 2 diabetes mellitus. Her disease course has been stable. Hypoglycemia symptoms include nervousness/anxiousness. Associated symptoms include foot paresthesias. Pertinent negatives for diabetes include no blurred vision and no visual change. Symptoms are stable. 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 Pertinent negatives for diabetic complications include no CVA or heart disease. Risk factors for coronary artery disease include dyslipidemia, hypertension, sedentary lifestyle, post-menopausal and tobacco exposure. She is following a generally unhealthy diet. Her overall blood glucose range is 90-110 mg/dl.  Hyperlipidemia  This is a chronic problem. The current episode started more than 1 year ago. The problem is controlled. Recent lipid tests were reviewed and are normal. Exacerbating diseases include obesity. Pertinent negatives include no shortness of breath. Current antihyperlipidemic treatment includes statins. The current treatment provides moderate  improvement of lipids. Risk factors for coronary artery disease include dyslipidemia, diabetes mellitus, hypertension and a sedentary lifestyle.  Hypertension  This is a chronic problem. The current episode started more than 1 year ago. The problem has been resolved since onset. The problem is controlled. Associated symptoms include anxiety and peripheral edema. Pertinent negatives include no blurred vision, malaise/fatigue or shortness of breath. Risk factors for coronary artery disease include dyslipidemia, diabetes mellitus, obesity and sedentary lifestyle. The current treatment provides moderate improvement. There is no history of CVA.  Gastroesophageal Reflux  She reports no belching or no heartburn. This is a chronic problem. The current episode started more than 1 year ago. The problem occurs occasionally. The problem has been waxing and waning. She has tried a PPI for the symptoms. The treatment provided moderate relief.  Depression         This is a chronic problem.  The current episode started more than 1 year ago.   The onset quality is gradual.   The problem occurs intermittently.  The problem has been waxing and waning since onset.  Associated symptoms include decreased concentration, insomnia, irritable, restlessness and sad.  Associated symptoms include no helplessness and no hopelessness.  Past medical history includes anxiety.   Anxiety  Presents for follow-up visit. Symptoms include decreased concentration, excessive worry, insomnia, irritability, nervous/anxious behavior and restlessness. Patient reports no shortness of breath. Symptoms occur occasionally. The severity of symptoms is moderate. The quality of sleep is good.    Diabetic Neuropathy PT states she has burning aching pain of 2 out 10. She takes gabapentin that greatly helps.     Review of Systems  Constitutional: Positive for irritability. Negative for malaise/fatigue.  Eyes: Negative for blurred vision.  Respiratory:  Negative for shortness of breath.   Gastrointestinal: Negative for heartburn.  Psychiatric/Behavioral:  Positive for decreased concentration and depression. The patient is nervous/anxious and has insomnia.   All other systems reviewed and are negative.    Observations/Objective: No SOB or distress   Assessment and Plan: Jasmine Peck comes in today with chief complaint of No chief complaint on file.   Diagnosis and orders addressed:  1. Type 2 diabetes mellitus with diabetic polyneuropathy, without long-term current use of insulin (HCC) - metFORMIN (GLUCOPHAGE) 500 MG tablet; TAKE TWO TABLETS BY MOUTH 2 TIMES A DAY WITH MEALS  Dispense: 360 tablet; Refill: 3 - CMP14+EGFR; Future - CBC with Differential/Platelet; Future - Bayer DCA Hb A1c Waived; Future - Microalbumin / creatinine urine ratio; Future  2. Moderate episode of recurrent major depressive disorder (HCC) - CMP14+EGFR; Future - CBC with Differential/Platelet; Future  3. Current smoker - CMP14+EGFR; Future - CBC with Differential/Platelet; Future  4. Diabetic peripheral neuropathy (HCC) - gabapentin (NEURONTIN) 600 MG tablet; TAKE ONE TABLET BY MOUTH 3 TIMES A DAY  Dispense: 90 tablet; Refill: 3 - CMP14+EGFR; Future - CBC with Differential/Platelet; Future  5. Morbid obesity with BMI of 45.0-49.9, adult (HCC) - CMP14+EGFR; Future - CBC with Differential/Platelet; Future  6. Hypertension associated with diabetes ([place]) - amLODipine (NORVASC) 10 MG tablet; Take 1 tablet (10 mg total) by mouth daily.  Dispense: 90 tablet; Refill: 3 - lisinopril (ZESTRIL) 40 MG tablet; Needs to be seen  Dispense: 30 tablet; Refill: 0 - furosemide (LASIX) 20 MG tablet; Take 1 tablet (20 mg total) by mouth daily.  Dispense: 90 tablet; Refill: 3 - CMP14+EGFR; Future - CBC with Differential/Platelet; Future  7. Hyperlipidemia associated with type 2 diabetes mellitus (HCC) - atorvastatin (LIPITOR) 10 MG tablet; Take 1 tablet (10 mg  total) by mouth daily.  Dispense: 90 tablet; Refill: 3 - CMP14+EGFR; Future - CBC with Differential/Platelet; Future - Lipid panel; Future  8. Human immunodeficiency virus (HIV) disease ([place]) - CMP14+EGFR; Future - CBC with Differential/Platelet; Future  9. Gastroesophageal reflux disease, esophagitis presence not specified - CMP14+EGFR; Future - CBC with Differential/Platelet; Future  10. GAD (generalized anxiety disorder)  - CMP14+EGFR; Future - CBC with Differential/Platelet; Future  11. Type 2 diabetes mellitus without complication, without long-term current use of insulin (HCC)  - CMP14+EGFR; Future - CBC with Differential/Platelet; Future   Labs pending Health Maintenance reviewed Diet and exercise encouraged  Follow up plan: 3 months  And keep ID follow up       I discussed the assessment and treatment plan with the patient. The patient was provided an opportunity to ask questions and all were answered. The patient agreed with the plan and demonstrated an understanding of the instructions.   The patient was advised to call back or seek an in-person evaluation if the symptoms worsen or if the condition fails to improve as anticipated.  The above assessment and management plan was discussed with the patient. The patient verbalized understanding of and has agreed to the management plan. Patient is aware to call the clinic if symptoms persist or worsen. Patient is aware when to return to the clinic for a follow-up visit. Patient educated on when it is appropriate to go to the emergency department.   Time call ended:  12:11 Am   I provided 13 minutes of non-face-to-face time during this encounter.    [name], FNP

## 2018-05-31 ENCOUNTER — Other Ambulatory Visit: Payer: Self-pay

## 2018-05-31 ENCOUNTER — Other Ambulatory Visit: Payer: Medicaid Other

## 2018-05-31 DIAGNOSIS — E1159 Type 2 diabetes mellitus with other circulatory complications: Secondary | ICD-10-CM

## 2018-05-31 DIAGNOSIS — E1142 Type 2 diabetes mellitus with diabetic polyneuropathy: Secondary | ICD-10-CM | POA: Diagnosis not present

## 2018-05-31 DIAGNOSIS — F17200 Nicotine dependence, unspecified, uncomplicated: Secondary | ICD-10-CM

## 2018-05-31 DIAGNOSIS — K219 Gastro-esophageal reflux disease without esophagitis: Secondary | ICD-10-CM

## 2018-05-31 DIAGNOSIS — E119 Type 2 diabetes mellitus without complications: Secondary | ICD-10-CM

## 2018-05-31 DIAGNOSIS — B20 Human immunodeficiency virus [HIV] disease: Secondary | ICD-10-CM

## 2018-05-31 DIAGNOSIS — E1169 Type 2 diabetes mellitus with other specified complication: Secondary | ICD-10-CM

## 2018-05-31 DIAGNOSIS — F331 Major depressive disorder, recurrent, moderate: Secondary | ICD-10-CM

## 2018-05-31 DIAGNOSIS — F411 Generalized anxiety disorder: Secondary | ICD-10-CM

## 2018-05-31 LAB — BAYER DCA HB A1C WAIVED: HB A1C (BAYER DCA - WAIVED): 5.7 % (ref <7.0)

## 2018-06-01 LAB — MICROALBUMIN / CREATININE URINE RATIO
Creatinine, Urine: 154.5 mg/dL
Microalb/Creat Ratio: 3 mg/g creat (ref 0–29)
Microalbumin, Urine: 4.6 ug/mL

## 2018-06-01 LAB — CMP14+EGFR
ALT: 7 IU/L (ref 0–32)
AST: 8 IU/L (ref 0–40)
Albumin/Globulin Ratio: 1.8 (ref 1.2–2.2)
Albumin: 4.2 g/dL (ref 3.8–4.8)
Alkaline Phosphatase: 77 IU/L (ref 39–117)
BUN/Creatinine Ratio: 17 (ref 9–23)
BUN: 13 mg/dL (ref 6–24)
Bilirubin Total: 0.3 mg/dL (ref 0.0–1.2)
CO2: 20 mmol/L (ref 20–29)
Calcium: 9.1 mg/dL (ref 8.7–10.2)
Chloride: 107 mmol/L — ABNORMAL HIGH (ref 96–106)
Creatinine, Ser: 0.75 mg/dL (ref 0.57–1.00)
GFR calc Af Amer: 113 mL/min/{1.73_m2} (ref >59)
GFR calc non Af Amer: 98 mL/min/{1.73_m2} (ref >59)
Globulin, Total: 2.3 g/dL (ref 1.5–4.5)
Glucose: 100 mg/dL — ABNORMAL HIGH (ref 65–99)
Potassium: 4.1 mmol/L (ref 3.5–5.2)
Sodium: 141 mmol/L (ref 134–144)
Total Protein: 6.5 g/dL (ref 6.0–8.5)

## 2018-06-01 LAB — CBC WITH DIFFERENTIAL/PLATELET
Basophils Absolute: 0.1 10*3/uL (ref 0.0–0.2)
Basos: 1 %
EOS (ABSOLUTE): 0.1 10*3/uL (ref 0.0–0.4)
Eos: 2 %
Hematocrit: 38.9 % (ref 34.0–46.6)
Hemoglobin: 13.4 g/dL (ref 11.1–15.9)
Immature Grans (Abs): 0 10*3/uL (ref 0.0–0.1)
Immature Granulocytes: 0 %
Lymphocytes Absolute: 3.3 10*3/uL — ABNORMAL HIGH (ref 0.7–3.1)
Lymphs: 39 %
MCH: 32 pg (ref 26.6–33.0)
MCHC: 34.4 g/dL (ref 31.5–35.7)
MCV: 93 fL (ref 79–97)
Monocytes Absolute: 0.7 10*3/uL (ref 0.1–0.9)
Monocytes: 8 %
Neutrophils Absolute: 4.3 10*3/uL (ref 1.4–7.0)
Neutrophils: 50 %
Platelets: 322 10*3/uL (ref 150–450)
RBC: 4.19 x10E6/uL (ref 3.77–5.28)
RDW: 13.8 % (ref 11.7–15.4)
WBC: 8.5 10*3/uL (ref 3.4–10.8)

## 2018-06-01 LAB — LIPID PANEL
Chol/HDL Ratio: 2.9 ratio (ref 0.0–4.4)
Cholesterol, Total: 117 mg/dL (ref 100–199)
HDL: 41 mg/dL (ref >39)
LDL Calculated: 63 mg/dL (ref 0–99)
Triglycerides: 67 mg/dL (ref 0–149)
VLDL Cholesterol Cal: 13 mg/dL (ref 5–40)

## 2018-06-10 ENCOUNTER — Other Ambulatory Visit: Payer: Self-pay | Admitting: Family

## 2018-06-10 DIAGNOSIS — I152 Hypertension secondary to endocrine disorders: Secondary | ICD-10-CM

## 2018-06-10 DIAGNOSIS — E1159 Type 2 diabetes mellitus with other circulatory complications: Secondary | ICD-10-CM

## 2018-06-24 ENCOUNTER — Other Ambulatory Visit: Payer: Self-pay | Admitting: Family

## 2018-08-09 ENCOUNTER — Other Ambulatory Visit: Payer: Self-pay | Admitting: Family

## 2018-09-23 ENCOUNTER — Other Ambulatory Visit: Payer: Self-pay | Admitting: Family

## 2018-09-23 DIAGNOSIS — E1142 Type 2 diabetes mellitus with diabetic polyneuropathy: Secondary | ICD-10-CM

## 2018-10-08 ENCOUNTER — Other Ambulatory Visit: Payer: Self-pay | Admitting: Family

## 2018-10-08 DIAGNOSIS — E1159 Type 2 diabetes mellitus with other circulatory complications: Secondary | ICD-10-CM

## 2018-10-21 ENCOUNTER — Other Ambulatory Visit: Payer: Self-pay | Admitting: Family

## 2018-10-21 DIAGNOSIS — E1142 Type 2 diabetes mellitus with diabetic polyneuropathy: Secondary | ICD-10-CM

## 2018-10-22 NOTE — Telephone Encounter (Signed)
[facility] NTBS 30 days given 09/24/18

## 2018-10-22 NOTE — Telephone Encounter (Signed)
Busy x2 10/9-jhb

## 2018-11-17 ENCOUNTER — Other Ambulatory Visit: Payer: Self-pay | Admitting: Family

## 2018-11-17 DIAGNOSIS — E1159 Type 2 diabetes mellitus with other circulatory complications: Secondary | ICD-10-CM

## 2018-11-18 NOTE — Telephone Encounter (Signed)
[facility]. NTBS for 6 mos fu. Refills sent to pharmacy, mail order

## 2018-11-18 NOTE — Telephone Encounter (Signed)
Detailed message left for patient letting her know it needs to be seen.

## 2018-11-29 ENCOUNTER — Ambulatory Visit
Admission: RE | Admit: 2018-11-29 | Discharge: 2018-11-29 | Disposition: A | Discharge status: Home / Self Care | Payer: Medicaid Other | Source: Ambulatory Visit | Attending: Family | Admitting: Family

## 2018-11-29 ENCOUNTER — Other Ambulatory Visit: Payer: Self-pay | Admitting: Family

## 2018-11-29 ENCOUNTER — Other Ambulatory Visit: Payer: Self-pay

## 2018-11-29 DIAGNOSIS — R928 Other abnormal and inconclusive findings on diagnostic imaging of breast: Secondary | ICD-10-CM | POA: Diagnosis not present

## 2018-11-29 DIAGNOSIS — N6320 Unspecified lump in the left breast, unspecified quadrant: Secondary | ICD-10-CM

## 2018-11-29 DIAGNOSIS — N6322 Unspecified lump in the left breast, upper inner quadrant: Secondary | ICD-10-CM | POA: Diagnosis not present

## 2018-12-08 ENCOUNTER — Ambulatory Visit
Admission: RE | Admit: 2018-12-08 | Discharge: 2018-12-08 | Disposition: A | Discharge status: Home / Self Care | Payer: Medicaid Other | Source: Ambulatory Visit | Attending: Family | Admitting: Family

## 2018-12-08 ENCOUNTER — Other Ambulatory Visit: Payer: Self-pay

## 2018-12-08 DIAGNOSIS — N6320 Unspecified lump in the left breast, unspecified quadrant: Secondary | ICD-10-CM

## 2018-12-08 DIAGNOSIS — N6322 Unspecified lump in the left breast, upper inner quadrant: Secondary | ICD-10-CM | POA: Diagnosis not present

## 2018-12-08 DIAGNOSIS — N62 Hypertrophy of breast: Secondary | ICD-10-CM | POA: Diagnosis not present

## 2018-12-10 ENCOUNTER — Other Ambulatory Visit: Payer: Self-pay | Admitting: Family

## 2018-12-14 ENCOUNTER — Ambulatory Visit (INDEPENDENT_AMBULATORY_CARE_PROVIDER_SITE_OTHER): Payer: Medicaid Other | Admitting: Family

## 2018-12-14 ENCOUNTER — Encounter: Payer: Self-pay | Admitting: Family

## 2018-12-14 DIAGNOSIS — E1169 Type 2 diabetes mellitus with other specified complication: Secondary | ICD-10-CM

## 2018-12-14 DIAGNOSIS — B20 Human immunodeficiency virus [HIV] disease: Secondary | ICD-10-CM

## 2018-12-14 DIAGNOSIS — Z6841 Body Mass Index (BMI) 40.0 and over, adult: Secondary | ICD-10-CM

## 2018-12-14 DIAGNOSIS — F411 Generalized anxiety disorder: Secondary | ICD-10-CM

## 2018-12-14 DIAGNOSIS — E559 Vitamin D deficiency, unspecified: Secondary | ICD-10-CM

## 2018-12-14 DIAGNOSIS — E1159 Type 2 diabetes mellitus with other circulatory complications: Secondary | ICD-10-CM | POA: Diagnosis not present

## 2018-12-14 DIAGNOSIS — E1142 Type 2 diabetes mellitus with diabetic polyneuropathy: Secondary | ICD-10-CM | POA: Diagnosis not present

## 2018-12-14 DIAGNOSIS — K219 Gastro-esophageal reflux disease without esophagitis: Secondary | ICD-10-CM

## 2018-12-14 DIAGNOSIS — I10 Essential (primary) hypertension: Secondary | ICD-10-CM

## 2018-12-14 DIAGNOSIS — F17200 Nicotine dependence, unspecified, uncomplicated: Secondary | ICD-10-CM

## 2018-12-14 DIAGNOSIS — E785 Hyperlipidemia, unspecified: Secondary | ICD-10-CM

## 2018-12-14 DIAGNOSIS — F331 Major depressive disorder, recurrent, moderate: Secondary | ICD-10-CM

## 2018-12-14 MED ORDER — FUROSEMIDE 20 MG PO TABS: 20.0000 mg | ORAL_TABLET | Freq: Every day | ORAL | 2 refills | Status: DC

## 2018-12-14 MED ORDER — GABAPENTIN 600 MG PO TABS: ORAL_TABLET | ORAL | 0 refills | Status: DC

## 2018-12-14 MED ORDER — AMLODIPINE BESYLATE 10 MG PO TABS: 10.0000 mg | ORAL_TABLET | Freq: Every day | ORAL | 3 refills | Status: DC

## 2018-12-14 MED ORDER — METFORMIN HCL 500 MG PO TABS: ORAL_TABLET | ORAL | 3 refills | Status: DC

## 2018-12-14 MED ORDER — LISINOPRIL 40 MG PO TABS: 40.0000 mg | ORAL_TABLET | Freq: Every day | ORAL | 0 refills | Status: DC

## 2018-12-14 MED ORDER — OMEPRAZOLE 20 MG PO CPDR: DELAYED_RELEASE_CAPSULE | ORAL | 4 refills | Status: DC

## 2018-12-14 MED ORDER — ATORVASTATIN CALCIUM 10 MG PO TABS: 10.0000 mg | ORAL_TABLET | Freq: Every day | ORAL | 3 refills | Status: DC

## 2018-12-14 NOTE — Progress Notes (Signed)
Virtual Visit via telephone Note Due to COVID-19 pandemic this visit was conducted virtually. This visit type was conducted due to national recommendations for restrictions regarding the COVID-19 Pandemic (e.g. social distancing, sheltering in place) in an effort to limit this patient's exposure and mitigate transmission in our community. All issues noted in this document were discussed and addressed.  A physical exam was not performed with this format.  I connected with Jasmine Peck on 12/14/18 at 11:45 AM by telephone and verified that I am speaking with the correct person using two identifiers. Jasmine Peck is currently located at work and no one is currently with her during visit. The provider, [name], FNP is located in their office at time of visit.  I discussed the limitations, risks, security and privacy concerns of performing an evaluation and management service by telephone and the availability of in person appointments. I also discussed with the patient that there may be a patient responsible charge related to this service. The patient expressed understanding and agreed to proceed.   History and Present Illness:  Pt presents to the office today for chronic follow up. Pt is HIV positive andis followed by Infectious Diseaseevery 6 months.  Diabetes She presents for her follow-up diabetic visit. She has type 2 diabetes mellitus. Her disease course has been stable. Hypoglycemia symptoms include nervousness/anxiousness. Pertinent negatives for hypoglycemia include no headaches. Associated symptoms include foot paresthesias. Pertinent negatives for diabetes include no blurred vision, no foot ulcerations and no visual change. There are no hypoglycemic complications. Symptoms are stable. Pertinent negatives for diabetic complications include no CVA, heart disease, nephropathy or peripheral neuropathy. Risk factors for coronary artery disease include dyslipidemia, diabetes mellitus,  hypertension, post-menopausal and sedentary lifestyle. She is following a generally unhealthy diet. Her overall blood glucose range is 90-110 mg/dl. An ACE inhibitor/angiotensin II receptor blocker is being taken. Eye exam is not current.  Hypertension This is a chronic problem. The current episode started more than 1 year ago. The problem has been resolved since onset. The problem is controlled. Associated symptoms include anxiety and peripheral edema ("feet"). Pertinent negatives include no blurred vision, headaches, malaise/fatigue, PND or shortness of breath. Risk factors for coronary artery disease include dyslipidemia, diabetes mellitus, obesity, sedentary lifestyle and smoking/tobacco exposure. The current treatment provides moderate improvement. There is no history of kidney disease, CAD/MI or CVA.  Gastroesophageal Reflux She complains of belching and heartburn. She reports no coughing or no hoarse voice. This is a chronic problem. The current episode started more than 1 year ago. The problem occurs occasionally. The problem has been waxing and waning. The symptoms are aggravated by smoking and certain foods. Risk factors include obesity. She has tried a PPI for the symptoms. The treatment provided moderate relief.  Hyperlipidemia This is a chronic problem. The current episode started more than 1 year ago. The problem is controlled. Recent lipid tests were reviewed and are normal. Exacerbating diseases include obesity. Pertinent negatives include no shortness of breath. Current antihyperlipidemic treatment includes statins. The current treatment provides moderate improvement of lipids. Risk factors for coronary artery disease include dyslipidemia, diabetes mellitus, hypertension, post-menopausal and a sedentary lifestyle.  Anxiety Presents for follow-up visit. Symptoms include decreased concentration, depressed mood, excessive worry, insomnia, irritability and nervous/anxious behavior. Patient  reports no shortness of breath. Symptoms occur occasionally. The severity of symptoms is moderate.    Depression        This is a chronic problem.  The current episode started more than 1  year ago.   The onset quality is sudden.   The problem occurs constantly.  The problem has been waxing and waning since onset.  Associated symptoms include decreased concentration and insomnia.  Associated symptoms include no headaches.  Past treatments include SSRIs - Selective serotonin reuptake inhibitors.  Past medical history includes anxiety.   Nicotine Dependence Presents for follow-up visit. Symptoms include decreased concentration, insomnia and irritability. Her urge triggers include company of smokers. The symptoms have been stable. She smokes < 1/2 a pack of cigarettes per day. Compliance with prior treatments has been good.  Diabetic Neuropathy Pt currently taking gabapentin 600 mg TID that helps. She states she has intermittent aching and buring pain of 3-4 out 10.     Review of Systems  Constitutional: Positive for irritability. Negative for malaise/fatigue.  HENT: Negative for hoarse voice.   Eyes: Negative for blurred vision.  Respiratory: Negative for cough and shortness of breath.   Cardiovascular: Negative for PND.  Gastrointestinal: Positive for heartburn.  Neurological: Negative for headaches.  Psychiatric/Behavioral: Positive for decreased concentration and depression. The patient is nervous/anxious and has insomnia.      Observations/Objective: No SOB or distress noted   Assessment and Plan: 1. Hypertension associated with diabetes ([place]) - lisinopril (ZESTRIL) 40 MG tablet; Take 1 tablet (40 mg total) by mouth daily.  Dispense: 90 tablet; Refill: 0 - furosemide (LASIX) 20 MG tablet; Take 1 tablet (20 mg total) by mouth daily.  Dispense: 90 tablet; Refill: 2 - amLODipine (NORVASC) 10 MG tablet; Take 1 tablet (10 mg total) by mouth daily.  Dispense: 90 tablet; Refill: 3  2.  Gastroesophageal reflux disease, unspecified whether esophagitis present - omeprazole (PRILOSEC) 20 MG capsule; TAKE ONE CAPSULE BY MOUTH EVERY DAY, NEEDS APPOINTMENT FOR REFILLS  Dispense: 90 capsule; Refill: 4  3. Type 2 diabetes mellitus with diabetic polyneuropathy, without long-term current use of insulin ([facility]) - metFORMIN (GLUCOPHAGE) 500 MG tablet; TAKE TWO TABLETS BY MOUTH 2 TIMES A DAY WITH MEALS  Dispense: 360 tablet; Refill: 3  4. Diabetic peripheral neuropathy ([facility]) - gabapentin (NEURONTIN) 600 MG tablet; TAKE ONE TABLET BY MOUTH 3 TIMES A DAY (Needs to be seen before next refill)  Dispense: 90 tablet; Refill: 0  5. Hyperlipidemia associated with type 2 diabetes mellitus ([facility])  - atorvastatin (LIPITOR) 10 MG tablet; Take 1 tablet (10 mg total) by mouth daily.  Dispense: 90 tablet; Refill: 3  6. GAD (generalized anxiety disorder)  7. Moderate episode of recurrent major depressive disorder ([place])  8. Human immunodeficiency virus (HIV) disease ([place])  9. Vitamin D deficiency  10. Current smoker  11. Morbid obesity with BMI of 45.0-49.9, adult ([place])  Will hold off on lab work at this time. Had labs 005/2020 and has appt with ID in next 2 weeks.  Continue medications  Healthy Diet and exercises encouraged RTO in 6 months      I discussed the assessment and treatment plan with the patient. The patient was provided an opportunity to ask questions and all were answered. The patient agreed with the plan and demonstrated an understanding of the instructions.   The patient was advised to call back or seek an in-person evaluation if the symptoms worsen or if the condition fails to improve as anticipated.  The above assessment and management plan was discussed with the patient. The patient verbalized understanding of and has agreed to the management plan. Patient is aware to call the clinic if symptoms persist or worsen. Patient is aware  when to return to the clinic for a follow-up  visit. Patient educated on when it is appropriate to go to the emergency department.   Time call ended: 12;00  pm   I provided 15 minutes of non-face-to-face time during this encounter.    [name], FNP

## 2019-01-25 ENCOUNTER — Ambulatory Visit: Payer: Medicaid Other | Attending: Internal Medicine

## 2019-02-03 DIAGNOSIS — F418 Other specified anxiety disorders: Secondary | ICD-10-CM | POA: Diagnosis not present

## 2019-02-03 DIAGNOSIS — L409 Psoriasis, unspecified: Secondary | ICD-10-CM | POA: Diagnosis not present

## 2019-02-03 DIAGNOSIS — Z23 Encounter for immunization: Secondary | ICD-10-CM | POA: Diagnosis not present

## 2019-02-03 DIAGNOSIS — I10 Essential (primary) hypertension: Secondary | ICD-10-CM | POA: Diagnosis not present

## 2019-02-03 DIAGNOSIS — K219 Gastro-esophageal reflux disease without esophagitis: Secondary | ICD-10-CM | POA: Diagnosis not present

## 2019-02-03 DIAGNOSIS — F4310 Post-traumatic stress disorder, unspecified: Secondary | ICD-10-CM | POA: Diagnosis not present

## 2019-02-03 DIAGNOSIS — M79673 Pain in unspecified foot: Secondary | ICD-10-CM | POA: Diagnosis not present

## 2019-02-03 DIAGNOSIS — E119 Type 2 diabetes mellitus without complications: Secondary | ICD-10-CM | POA: Diagnosis not present

## 2019-02-03 DIAGNOSIS — G479 Sleep disorder, unspecified: Secondary | ICD-10-CM | POA: Diagnosis not present

## 2019-02-03 DIAGNOSIS — G629 Polyneuropathy, unspecified: Secondary | ICD-10-CM | POA: Diagnosis not present

## 2019-02-03 DIAGNOSIS — B20 Human immunodeficiency virus [HIV] disease: Secondary | ICD-10-CM | POA: Diagnosis not present

## 2019-02-21 ENCOUNTER — Ambulatory Visit (INDEPENDENT_AMBULATORY_CARE_PROVIDER_SITE_OTHER): Payer: Medicaid Other | Admitting: Family Medicine

## 2019-02-21 ENCOUNTER — Encounter: Payer: Self-pay | Admitting: Family Medicine

## 2019-02-21 DIAGNOSIS — J019 Acute sinusitis, unspecified: Secondary | ICD-10-CM

## 2019-02-21 MED ORDER — AMOXICILLIN-POT CLAVULANATE 875-125 MG PO TABS: 1.0000 | ORAL_TABLET | Freq: Two times a day (BID) | ORAL | 0 refills | Status: AC

## 2019-02-21 NOTE — Patient Instructions (Signed)
Sinusitis, Adult Sinusitis is soreness and swelling (inflammation) of your sinuses. Sinuses are hollow spaces in the bones around your face. They are located:  Around your eyes.  In the middle of your forehead.  Behind your nose.  In your cheekbones. Your sinuses and nasal passages are lined with a fluid called mucus. Mucus drains out of your sinuses. Swelling can trap mucus in your sinuses. This lets germs (bacteria, virus, or fungus) grow, which leads to infection. Most of the time, this condition is caused by a virus. What are the causes? This condition is caused by:  Allergies.  Asthma.  Germs.  Things that block your nose or sinuses.  Growths in the nose (nasal polyps).  Chemicals or irritants in the air.  Fungus (rare). What increases the risk? You are more likely to develop this condition if:  You have a weak body defense system (immune system).  You do a lot of swimming or diving.  You use nasal sprays too much.  You smoke. What are the signs or symptoms? The main symptoms of this condition are pain and a feeling of pressure around the sinuses. Other symptoms include:  Stuffy nose (congestion).  Runny nose (drainage).  Swelling and warmth in the sinuses.  Headache.  Toothache.  A cough that may get worse at night.  Mucus that collects in the throat or the back of the nose (postnasal drip).  Being unable to smell and taste.  Being very tired (fatigue).  A fever.  Sore throat.  Bad breath. How is this diagnosed? This condition is diagnosed based on:  Your symptoms.  Your medical history.  A physical exam.  Tests to find out if your condition is short-term (acute) or long-term (chronic). Your doctor may: ? Check your nose for growths (polyps). ? Check your sinuses using a tool that has a light (endoscope). ? Check for allergies or germs. ? Do imaging tests, such as an MRI or CT scan. How is this treated? Treatment for this condition  depends on the cause and whether it is short-term or long-term.  If caused by a virus, your symptoms should go away on their own within 10 days. You may be given medicines to relieve symptoms. They include: ? Medicines that shrink swollen tissue in the nose. ? Medicines that treat allergies (antihistamines). ? A spray that treats swelling of the nostrils. ? Rinses that help get rid of thick mucus in your nose (nasal saline washes).  If caused by bacteria, your doctor may wait to see if you will get better without treatment. You may be given antibiotic medicine if you have: ? A very bad infection. ? A weak body defense system.  If caused by growths in the nose, you may need to have surgery. Follow these instructions at home: Medicines  Take, use, or apply over-the-counter and prescription medicines only as told by your doctor. These may include nasal sprays.  If you were prescribed an antibiotic medicine, take it as told by your doctor. Do not stop taking the antibiotic even if you start to feel better. Hydrate and humidify   Drink enough water to keep your pee (urine) pale yellow.  Use a cool mist humidifier to keep the humidity level in your home above 50%.  Breathe in steam for 10-15 minutes, 3-4 times a day, or as told by your doctor. You can do this in the bathroom while a hot shower is running.  Try not to spend time in cool or dry air.   Rest  Rest as much as you can.  Sleep with your head raised (elevated).  Make sure you get enough sleep each night. General instructions   Put a warm, moist washcloth on your face 3-4 times a day, or as often as told by your doctor. This will help with discomfort.  Wash your hands often with soap and water. If there is no soap and water, use hand sanitizer.  Do not smoke. Avoid being around people who are smoking (secondhand smoke).  Keep all follow-up visits as told by your doctor. This is important. Contact a doctor if:  You  have a fever.  Your symptoms get worse.  Your symptoms do not get better within 10 days. Get help right away if:  You have a very bad headache.  You cannot stop throwing up (vomiting).  You have very bad pain or swelling around your face or eyes.  You have trouble seeing.  You feel confused.  Your neck is stiff.  You have trouble breathing. Summary  Sinusitis is swelling of your sinuses. Sinuses are hollow spaces in the bones around your face.  This condition is caused by tissues in your nose that become inflamed or swollen. This traps germs. These can lead to infection.  If you were prescribed an antibiotic medicine, take it as told by your doctor. Do not stop taking it even if you start to feel better.  Keep all follow-up visits as told by your doctor. This is important. This information is not intended to replace advice given to you by your health care provider. Make sure you discuss any questions you have with your health care provider. Document Revised: 06/01/2017 Document Reviewed: 06/01/2017 Elsevier Patient Education  [place] if You Are Sick If you are sick with COVID-19 or think you might have COVID-19, follow the steps below to care for yourself and to help protect other people in your home and community. Stay home except to get medical care.  Stay home. Most people with COVID-19 have mild illness and are able to recover at home without medical care. Do not leave your home, except to get medical care. Do not visit public areas.  Take care of yourself. Get rest and stay hydrated. Take over-the-counter medicines, such as acetaminophen, to help you feel better.  Stay in touch with your doctor. Call before you get medical care. Be sure to get care if you have trouble breathing, or have any other emergency warning signs, or if you think it is an emergency.  Avoid public transportation, ride-sharing, or taxis. Separate  yourself from other people and pets in your home.  As much as possible, stay in a specific room and away from other people and pets in your home. Also, you should use a separate bathroom, if available. If you need to be around other people or animals in or outside of the home, wear a mask. ? See COVID-19 and Animals if you have questions about [employer]. ? Additional guidance is available for those living in close quarters. (http://www.turner-rogers.com/.html) and shared housing (TVStereos.ch). Monitor your symptoms.  Symptoms of COVID-19 include fever, cough, and shortness of breath but other symptoms may be present as well.  Follow care instructions from your healthcare provider and local health department. Your local health authorities will give instructions on checking your symptoms and reporting information. When to Seek Emergency Medical Attention Look for emergency warning signs* for COVID-19. If someone is showing any of these  signs, seek emergency medical care immediately:  Trouble breathing  Persistent pain or pressure in the chest  New confusion  Bluish lips or face  Inability to wake or stay awake *This list is not all possible symptoms. Please call your medical provider for any other symptoms that are severe or concerning to you. Call 911 or call ahead to your local emergency facility: Notify the operator that you are seeking care for someone who has or may have COVID-19. Call ahead before visiting your doctor.  Call ahead. Many medical visits for routine care are being postponed or done by phone or telemedicine.  If you have a medical appointment that cannot be postponed, call your doctor's office, and tell them you have or may have COVID-19. If you are sick, wear a mask over your nose and  mouth.  You should wear a mask over your nose and mouth if you must be around other people or animals, including pets (even at home).  You don't need to wear the mask if you are alone. If you can't put on a mask (because of trouble breathing for example), cover your coughs and sneezes in some other way. Try to stay at least 6 feet away from other people. This will help protect the people around you.  Masks should not be placed on young children under age 18 years, anyone who has trouble breathing, or anyone who is not able to remove the mask without help. Note: During the COVID-19 pandemic, medical grade facemasks are reserved for healthcare workers and some first responders. You may need to make a mask using a scarf or bandana. Cover your coughs and sneezes.  Cover your mouth and nose with a tissue when you cough or sneeze.  Throw used tissues in a lined trash can.  Immediately wash your hands with soap and water for at least 20 seconds. If soap and water are not available, clean your hands with an alcohol-based hand sanitizer that contains at least 60% alcohol. Clean your hands often.  Wash your hands often with soap and water for at least 20 seconds. This is especially important after blowing your nose, coughing, or sneezing; going to the bathroom; and before eating or preparing food.  Use hand sanitizer if soap and water are not available. Use an alcohol-based hand sanitizer with at least 60% alcohol, covering all surfaces of your hands and rubbing them together until they feel dry.  Soap and water are the best option, especially if your hands are visibly dirty.  Avoid touching your eyes, nose, and mouth with unwashed hands. Avoid sharing personal household items.  Do not share dishes, drinking glasses, cups, eating utensils, towels, or bedding with other people in your home.  Wash these items thoroughly after using them with soap and water or put them in the dishwasher. Clean all  "high-touch" surfaces everyday.  Clean and disinfect high-touch surfaces in your "sick room" and bathroom. Let someone else clean and disinfect surfaces in common areas, but not your bedroom and bathroom.  If a caregiver or other person needs to clean and disinfect a sick person's bedroom or bathroom, they should do so on an as-needed basis. The caregiver/other person should wear a mask and wait as long as possible after the sick person has used the bathroom. High-touch surfaces include phones, remote controls, counters, tabletops, doorknobs, bathroom fixtures, toilets, keyboards, tablets, and bedside tables.  Clean and disinfect areas that may have blood, stool, or body fluids on them.  Use household  cleaners and disinfectants. Clean the area or item with soap and water or another detergent if it is dirty. Then use a household disinfectant. ? Be sure to follow the instructions on the label to ensure safe and effective use of the product. Many products recommend keeping the surface wet for several minutes to ensure germs are killed. Many also recommend precautions such as wearing gloves and making sure you have good ventilation during use of the product. ? Most EPA-registered household disinfectants should be effective. When you can be around others after you had or likely had COVID-19 When you can be around others (end home isolation) depends on different factors for different situations.  I think or know I had COVID-19, and I had symptoms ? You can be with others after  24 hours with no fever AND  Symptoms improved AND  10 days since symptoms first appeared ? Depending on your healthcare provider's advice and availability of testing, you might get tested to see if you still have COVID-19. If you will be tested, you can be around others when you have no fever, symptoms have improved, and you receive two negative test results in a row, at least 24 hours apart.  I tested positive for COVID-19  but had no symptoms ? If you continue to have no symptoms, you can be with others after:  10 days have passed since test ? Depending on your healthcare provider's advice and availability of testing, you might get tested to see if you still have COVID-19. If you will be tested, you can be around others after you receive two negative test results in a row, at least 24 hours apart. ? If you develop symptoms after testing positive, follow the guidance above for "I think or know I had COVID, and I had symptoms." [email or web address] 08/24/2018 This information is not intended to replace advice given to you by your health care provider. Make sure you discuss any questions you have with your health care provider. Document Revised: 09/09/2018 Document Reviewed: 07/13/2018 Elsevier Patient Education  [place].

## 2019-02-21 NOTE — Progress Notes (Signed)
Virtual Visit via Telephone Note  I connected with Jasmine Peck on 02/21/19 at 9:22 AM by telephone and verified that I am speaking with the correct person using two identifiers. Jasmine Peck is currently located at home and nobody is currently with her during this visit. The provider, Loman Brooklyn, FNP is located in their home at time of visit.  I discussed the limitations, risks, security and privacy concerns of performing an evaluation and management service by telephone and the availability of in person appointments. I also discussed with the patient that there may be a patient responsible charge related to this service. The patient expressed understanding and agreed to proceed.  Subjective: PCP: Sharion Balloon, FNP  Chief Complaint  Patient presents with  . URI   Patient complains of head congestion, facial pain/pressure and postnasal drainage. Onset of symptoms was 1 day ago, unchanged since that time. She is drinking plenty of fluids. Evaluation to date: none. Treatment to date: antihistamines.  She does smoke.    ROS: Per HPI  Current Outpatient Medications:  .  abacavir-dolutegravir-lamiVUDine (TRIUMEQ) 600-50-300 MG tablet, TAKE 1 TABLET BY MOUTH DAILY., Disp: , Rfl:  .  Accu-Chek FastClix Lancets MISC, Test one daily Dx E11.9, Disp: 102 each, Rfl: 3 .  albuterol (PROVENTIL HFA;VENTOLIN HFA) 108 (90 BASE) MCG/ACT inhaler, Inhale into the lungs every 6 (six) hours as needed for wheezing or shortness of breath., Disp: , Rfl:  .  amLODipine (NORVASC) 10 MG tablet, Take 1 tablet (10 mg total) by mouth daily., Disp: 90 tablet, Rfl: 3 .  atorvastatin (LIPITOR) 10 MG tablet, Take 1 tablet (10 mg total) by mouth daily., Disp: 90 tablet, Rfl: 3 .  blood glucose meter kit and supplies, Dispense based on patient and insurance preference. Use up to four times daily as directed. (FOR ICD-10 E10.9, E11.9)., Disp: 1 each, Rfl: 0 .  Elastic Bandages & Supports (V-2 HIGH  COMPRESSION HOSE) MISC, 1 each by Does not apply route daily., Disp: 1 each, Rfl: 0 .  fluocinonide (LIDEX) 0.05 % external solution, Apply twice a day to affected areas of scalp until smooth, Disp: , Rfl:  .  furosemide (LASIX) 20 MG tablet, Take 1 tablet (20 mg total) by mouth daily., Disp: 90 tablet, Rfl: 2 .  gabapentin (NEURONTIN) 600 MG tablet, TAKE ONE TABLET BY MOUTH 3 TIMES A DAY (Needs to be seen before next refill), Disp: 90 tablet, Rfl: 0 .  glucose blood (ACCU-CHEK AVIVA PLUS) test strip, Test once dail Dx E11.9, Disp: 100 strip, Rfl: 3 .  lisinopril (ZESTRIL) 40 MG tablet, Take 1 tablet (40 mg total) by mouth daily., Disp: 90 tablet, Rfl: 0 .  metFORMIN (GLUCOPHAGE) 500 MG tablet, TAKE TWO TABLETS BY MOUTH 2 TIMES A DAY WITH MEALS, Disp: 360 tablet, Rfl: 3 .  omeprazole (PRILOSEC) 20 MG capsule, TAKE ONE CAPSULE BY MOUTH EVERY DAY, NEEDS APPOINTMENT FOR REFILLS, Disp: 90 capsule, Rfl: 4  Allergies  Allergen Reactions  . Clindamycin/Lincomycin     rash  . Erythromycin Other (See Comments)    Abdominal pain   Past Medical History:  Diagnosis Date  . Anxiety   . Depression   . Diabetes mellitus without complication ([place])   . GERD (gastroesophageal reflux disease)   . HIV infection ([place])   . Hypertension     Observations/Objective: A&O  No respiratory distress or wheezing audible over the phone Mood, judgement, and thought processes all WNL  Assessment and Plan: 1. Acute non-recurrent sinusitis,  unspecified location - Discussed symptom management. Encouraged patient to schedule COVID-19 test; information provided on how to do so. Education provided on sinusitis and COVID-19.  - amoxicillin-clavulanate (AUGMENTIN) 875-125 MG tablet; Take 1 tablet by mouth 2 (two) times daily for 7 days.  Dispense: 14 tablet; Refill: 0   Follow Up Instructions:  I discussed the assessment and treatment plan with the patient. The patient was provided an opportunity to ask questions and  all were answered. The patient agreed with the plan and demonstrated an understanding of the instructions.   The patient was advised to call back or seek an in-person evaluation if the symptoms worsen or if the condition fails to improve as anticipated.  The above assessment and management plan was discussed with the patient. The patient verbalized understanding of and has agreed to the management plan. Patient is aware to call the clinic if symptoms persist or worsen. Patient is aware when to return to the clinic for a follow-up visit. Patient educated on when it is appropriate to go to the emergency department.   Time call ended: 9:33 AM  I provided 13 minutes of non-face-to-face time during this encounter.  Hendricks Limes, MSN, APRN, FNP-C [facility] 02/21/19

## 2019-02-22 ENCOUNTER — Telehealth: Payer: Self-pay | Admitting: Family

## 2019-02-22 NOTE — Telephone Encounter (Signed)
Spoke with pharmacist at [facility], they did not receive prescription.  Gave them verbal order for Augmentin 875-125, 1 PO bid x 7 days, #14 with no refills from [facility].  Contacted patient to let them know prescription had been called in.

## 2019-03-14 ENCOUNTER — Encounter: Admit: 2019-03-14 | Discharge: 2019-03-15 | Payer: BLUE CROSS/BLUE SHIELD | Attending: Medical | Primary: Medical

## 2019-03-14 DIAGNOSIS — L409 Psoriasis, unspecified: Principal | ICD-10-CM

## 2019-03-14 DIAGNOSIS — Z9049 Acquired absence of other specified parts of digestive tract: Secondary | ICD-10-CM | POA: Diagnosis not present

## 2019-03-14 DIAGNOSIS — K219 Gastro-esophageal reflux disease without esophagitis: Secondary | ICD-10-CM | POA: Diagnosis not present

## 2019-03-29 ENCOUNTER — Other Ambulatory Visit: Payer: Self-pay | Admitting: Family

## 2019-03-29 DIAGNOSIS — E1142 Type 2 diabetes mellitus with diabetic polyneuropathy: Secondary | ICD-10-CM

## 2019-03-29 NOTE — Telephone Encounter (Signed)
OV 12/14/18 RTC 6 mos

## 2019-04-13 ENCOUNTER — Encounter
Admit: 2019-04-13 | Discharge: 2019-04-14 | Payer: BLUE CROSS/BLUE SHIELD | Attending: Nurse Practitioner | Primary: Nurse Practitioner

## 2019-04-13 DIAGNOSIS — M79641 Pain in right hand: Principal | ICD-10-CM

## 2019-04-13 DIAGNOSIS — M7989 Other specified soft tissue disorders: Secondary | ICD-10-CM | POA: Diagnosis not present

## 2019-04-13 MED ORDER — CEPHALEXIN 500 MG CAPSULE
ORAL_CAPSULE | Freq: Four times a day (QID) | ORAL | 0 refills | 10 days | Status: CP
Start: 2019-04-13 — End: 2019-04-23

## 2019-04-21 ENCOUNTER — Encounter: Admit: 2019-04-21 | Discharge: 2019-04-22 | Payer: BLUE CROSS/BLUE SHIELD | Attending: Family | Primary: Family

## 2019-04-21 DIAGNOSIS — M2550 Pain in unspecified joint: Principal | ICD-10-CM

## 2019-04-21 DIAGNOSIS — M25541 Pain in joints of right hand: Principal | ICD-10-CM

## 2019-04-21 DIAGNOSIS — L409 Psoriasis, unspecified: Principal | ICD-10-CM

## 2019-04-21 MED ORDER — PREDNISONE 5 MG TABLET
ORAL_TABLET | 0 refills | 0 days | Status: CP
Start: 2019-04-21 — End: ?

## 2019-04-29 ENCOUNTER — Encounter: Admit: 2019-04-29 | Discharge: 2019-04-30 | Payer: BLUE CROSS/BLUE SHIELD | Attending: Family | Primary: Family

## 2019-04-29 DIAGNOSIS — M79641 Pain in right hand: Principal | ICD-10-CM

## 2019-04-29 DIAGNOSIS — G5790 Unspecified mononeuropathy of unspecified lower limb: Principal | ICD-10-CM

## 2019-04-29 DIAGNOSIS — R700 Elevated erythrocyte sedimentation rate: Principal | ICD-10-CM

## 2019-05-16 ENCOUNTER — Encounter
Admit: 2019-05-16 | Discharge: 2019-05-17 | Payer: BLUE CROSS/BLUE SHIELD | Attending: Nurse Practitioner | Primary: Nurse Practitioner

## 2019-05-16 DIAGNOSIS — R1012 Left upper quadrant pain: Principal | ICD-10-CM

## 2019-05-16 MED ORDER — DICYCLOMINE 10 MG CAPSULE
ORAL_CAPSULE | Freq: Four times a day (QID) | ORAL | 0 refills | 14 days | Status: CP | PRN
Start: 2019-05-16 — End: 2019-05-21

## 2019-05-23 ENCOUNTER — Encounter
Admit: 2019-05-23 | Discharge: 2019-05-24 | Payer: BLUE CROSS/BLUE SHIELD | Attending: Student in an Organized Health Care Education/Training Program | Primary: Student in an Organized Health Care Education/Training Program

## 2019-05-23 DIAGNOSIS — L409 Psoriasis, unspecified: Principal | ICD-10-CM

## 2019-05-23 MED ORDER — TRIAMCINOLONE ACETONIDE 0.1 % TOPICAL OINTMENT
2 refills | 0 days | Status: CP
Start: 2019-05-23 — End: ?

## 2019-05-23 MED ORDER — CLOBETASOL 0.05 % TOPICAL OINTMENT
2 refills | 0 days | Status: CP
Start: 2019-05-23 — End: ?

## 2019-05-30 ENCOUNTER — Ambulatory Visit (INDEPENDENT_AMBULATORY_CARE_PROVIDER_SITE_OTHER): Payer: Medicaid Other | Admitting: Family Medicine

## 2019-05-30 ENCOUNTER — Encounter: Payer: Self-pay | Admitting: Family Medicine

## 2019-05-30 DIAGNOSIS — J0111 Acute recurrent frontal sinusitis: Secondary | ICD-10-CM

## 2019-05-30 MED ORDER — CEFDINIR 300 MG PO CAPS: 300.0000 mg | ORAL_CAPSULE | Freq: Two times a day (BID) | ORAL | 0 refills | Status: DC

## 2019-05-30 NOTE — Progress Notes (Signed)
Virtual Visit via telephone Note  I connected with Jasmine Peck on 05/30/19 at 1043 by telephone and verified that I am speaking with the correct person using two identifiers. Jasmine Peck is currently located at home and no other people are currently with her during visit. The provider, Fransisca Kaufmann Breton Berns, MD is located in their office at time of visit.  Call ended at 1048  I discussed the limitations, risks, security and privacy concerns of performing an evaluation and management service by telephone and the availability of in person appointments. I also discussed with the patient that there may be a patient responsible charge related to this service. The patient expressed understanding and agreed to proceed.   History and Present Illness: Patient is calling in for 2 weeks of allergies and sinus pressure.  She is getting a lot of postnasal drainage and congestion and pressure in face and head.  She has tried claritin and sudafed and OTC cough syrup and it is not improving.  She is coughing a lot at night and early AM.  She has a lot of sinus pressure in forehead and under eyes.  The drainage is rotten smell.   1. Acute recurrent frontal sinusitis     Outpatient Encounter Medications as of 05/30/2019  Medication Sig  . abacavir-dolutegravir-lamiVUDine (TRIUMEQ) 600-50-300 MG tablet TAKE 1 TABLET BY MOUTH DAILY.  Marland Kitchen Accu-Chek FastClix Lancets MISC Test one daily Dx E11.9  . albuterol (PROVENTIL HFA;VENTOLIN HFA) 108 (90 BASE) MCG/ACT inhaler Inhale into the lungs every 6 (six) hours as needed for wheezing or shortness of breath.  Marland Kitchen amLODipine (NORVASC) 10 MG tablet Take 1 tablet (10 mg total) by mouth daily.  Marland Kitchen atorvastatin (LIPITOR) 10 MG tablet Take 1 tablet (10 mg total) by mouth daily.  . blood glucose meter kit and supplies Dispense based on patient and insurance preference. Use up to four times daily as directed. (FOR ICD-10 E10.9, E11.9).  . cefdinir (OMNICEF) 300 MG capsule  Take 1 capsule (300 mg total) by mouth 2 (two) times daily. 1 po BID  . Elastic Bandages & Supports (V-2 HIGH COMPRESSION HOSE) MISC 1 each by Does not apply route daily.  . fluocinonide (LIDEX) 0.05 % external solution Apply twice a day to affected areas of scalp until smooth  . furosemide (LASIX) 20 MG tablet Take 1 tablet (20 mg total) by mouth daily.  Marland Kitchen gabapentin (NEURONTIN) 600 MG tablet TAKE ONE TABLET BY MOUTH 3 TIMES A DAY  . glucose blood (ACCU-CHEK AVIVA PLUS) test strip Test once dail Dx E11.9  . lisinopril (ZESTRIL) 40 MG tablet Take 1 tablet (40 mg total) by mouth daily.  . metFORMIN (GLUCOPHAGE) 500 MG tablet TAKE TWO TABLETS BY MOUTH 2 TIMES A DAY WITH MEALS  . omeprazole (PRILOSEC) 20 MG capsule TAKE ONE CAPSULE BY MOUTH EVERY DAY, NEEDS APPOINTMENT FOR REFILLS   No facility-administered encounter medications on file as of 05/30/2019.    Review of Systems  Constitutional: Negative for chills and fever.  HENT: Positive for congestion, postnasal drip, rhinorrhea, sinus pressure, sneezing and sore throat. Negative for ear discharge and ear pain.   Eyes: Negative for pain, redness and visual disturbance.  Respiratory: Positive for cough. Negative for chest tightness and shortness of breath.   Cardiovascular: Negative for chest pain and leg swelling.  Musculoskeletal: Negative for back pain and gait problem.  Skin: Negative for rash.  Neurological: Negative for light-headedness and headaches.  Psychiatric/Behavioral: Negative for agitation and behavioral problems.  All other  systems reviewed and are negative.   Observations/Objective: Patient sounds comfortable in no acute distress  Assessment and Plan: Problem List Items Addressed This Visit    None    Visit Diagnoses    Acute recurrent frontal sinusitis    -  Primary   Relevant Medications   cefdinir (OMNICEF) 300 MG capsule      We will give cefdinir. Follow up plan: Return if symptoms worsen or fail to  improve.     I discussed the assessment and treatment plan with the patient. The patient was provided an opportunity to ask questions and all were answered. The patient agreed with the plan and demonstrated an understanding of the instructions.   The patient was advised to call back or seek an in-person evaluation if the symptoms worsen or if the condition fails to improve as anticipated.  The above assessment and management plan was discussed with the patient. The patient verbalized understanding of and has agreed to the management plan. Patient is aware to call the clinic if symptoms persist or worsen. Patient is aware when to return to the clinic for a follow-up visit. Patient educated on when it is appropriate to go to the emergency department.    I provided 5 minutes of non-face-to-face time during this encounter.    Worthy Rancher, MD

## 2019-06-03 ENCOUNTER — Encounter
Admit: 2019-06-03 | Discharge: 2019-06-03 | Disposition: A | Discharge status: Home / Self Care | Payer: BLUE CROSS/BLUE SHIELD

## 2019-06-03 DIAGNOSIS — J069 Acute upper respiratory infection, unspecified: Principal | ICD-10-CM

## 2019-06-03 DIAGNOSIS — R202 Paresthesia of skin: Principal | ICD-10-CM

## 2019-06-03 DIAGNOSIS — J309 Allergic rhinitis, unspecified: Principal | ICD-10-CM

## 2019-06-03 DIAGNOSIS — R05 Cough: Secondary | ICD-10-CM | POA: Diagnosis not present

## 2019-06-03 MED ORDER — CETIRIZINE 10 MG TABLET
ORAL_TABLET | Freq: Every day | ORAL | 0 refills | 20.00000 days | Status: CP
Start: 2019-06-03 — End: 2019-06-03

## 2019-06-03 MED ORDER — CETIRIZINE 10 MG TABLET: 10 mg | tablet | Freq: Every day | 0 refills | 20 days | Status: AC

## 2019-06-17 ENCOUNTER — Other Ambulatory Visit: Payer: Self-pay | Admitting: Family

## 2019-06-17 DIAGNOSIS — I152 Hypertension secondary to endocrine disorders: Secondary | ICD-10-CM

## 2019-06-17 DIAGNOSIS — E1142 Type 2 diabetes mellitus with diabetic polyneuropathy: Secondary | ICD-10-CM

## 2019-06-20 NOTE — Telephone Encounter (Signed)
[facility] NTBS in June for 6 mos check up

## 2019-06-29 DIAGNOSIS — I10 Essential (primary) hypertension: Principal | ICD-10-CM

## 2019-06-29 DIAGNOSIS — R030 Elevated blood-pressure reading, without diagnosis of hypertension: Principal | ICD-10-CM

## 2019-06-29 DIAGNOSIS — J039 Acute tonsillitis, unspecified: Principal | ICD-10-CM

## 2019-06-29 MED ORDER — AMOXICILLIN 500 MG CAPSULE
ORAL_CAPSULE | Freq: Three times a day (TID) | ORAL | 0 refills | 10.00000 days | Status: CP
Start: 2019-06-29 — End: 2019-07-09

## 2019-06-30 ENCOUNTER — Encounter
Admit: 2019-06-30 | Discharge: 2019-06-30 | Disposition: A | Discharge status: Home / Self Care | Payer: BLUE CROSS/BLUE SHIELD | Attending: Physician Assistant

## 2019-07-12 ENCOUNTER — Other Ambulatory Visit: Payer: Self-pay | Admitting: Family

## 2019-07-12 DIAGNOSIS — E1142 Type 2 diabetes mellitus with diabetic polyneuropathy: Secondary | ICD-10-CM

## 2019-07-13 NOTE — Telephone Encounter (Signed)
ntbs for refills 30 day supply was given on 06/17/19

## 2019-07-13 NOTE — Telephone Encounter (Signed)
Appt made

## 2019-07-15 ENCOUNTER — Ambulatory Visit
Admit: 2019-07-15 | Discharge: 2019-07-15 | Disposition: A | Discharge status: Home / Self Care | Payer: BLUE CROSS/BLUE SHIELD | Attending: Physician Assistant

## 2019-07-15 DIAGNOSIS — J039 Acute tonsillitis, unspecified: Principal | ICD-10-CM

## 2019-07-15 MED ORDER — CEFDINIR 300 MG CAPSULE
ORAL_CAPSULE | Freq: Every day | ORAL | 0 refills | 14.00000 days | Status: CP
Start: 2019-07-15 — End: 2019-07-29

## 2019-07-27 ENCOUNTER — Ambulatory Visit
Admit: 2019-07-27 | Discharge: 2019-07-27 | Disposition: A | Discharge status: Home / Self Care | Payer: BLUE CROSS/BLUE SHIELD

## 2019-07-27 DIAGNOSIS — R21 Rash and other nonspecific skin eruption: Principal | ICD-10-CM

## 2019-07-27 MED ORDER — PREDNISONE 10 MG TABLET
ORAL_TABLET | Freq: Every day | ORAL | 0 refills | 5.00000 days | Status: CP
Start: 2019-07-27 — End: 2019-08-01

## 2019-07-28 ENCOUNTER — Ambulatory Visit (INDEPENDENT_AMBULATORY_CARE_PROVIDER_SITE_OTHER): Payer: Medicaid Other | Admitting: Family

## 2019-07-28 ENCOUNTER — Encounter: Payer: Self-pay | Admitting: Family

## 2019-07-28 ENCOUNTER — Ambulatory Visit: Admit: 2019-07-28 | Discharge: 2019-07-29 | Payer: BLUE CROSS/BLUE SHIELD | Attending: Family | Primary: Family

## 2019-07-28 DIAGNOSIS — L239 Allergic contact dermatitis, unspecified cause: Principal | ICD-10-CM

## 2019-07-28 DIAGNOSIS — B370 Candidal stomatitis: Principal | ICD-10-CM

## 2019-07-28 DIAGNOSIS — Z6841 Body Mass Index (BMI) 40.0 and over, adult: Secondary | ICD-10-CM

## 2019-07-28 DIAGNOSIS — E785 Hyperlipidemia, unspecified: Secondary | ICD-10-CM

## 2019-07-28 DIAGNOSIS — J351 Hypertrophy of tonsils: Secondary | ICD-10-CM | POA: Diagnosis not present

## 2019-07-28 DIAGNOSIS — F17200 Nicotine dependence, unspecified, uncomplicated: Secondary | ICD-10-CM

## 2019-07-28 DIAGNOSIS — Z09 Encounter for follow-up examination after completed treatment for conditions other than malignant neoplasm: Secondary | ICD-10-CM

## 2019-07-28 DIAGNOSIS — F331 Major depressive disorder, recurrent, moderate: Secondary | ICD-10-CM

## 2019-07-28 DIAGNOSIS — R21 Rash and other nonspecific skin eruption: Secondary | ICD-10-CM

## 2019-07-28 DIAGNOSIS — I10 Essential (primary) hypertension: Secondary | ICD-10-CM

## 2019-07-28 DIAGNOSIS — F411 Generalized anxiety disorder: Secondary | ICD-10-CM

## 2019-07-28 DIAGNOSIS — J301 Allergic rhinitis due to pollen: Secondary | ICD-10-CM

## 2019-07-28 DIAGNOSIS — E1142 Type 2 diabetes mellitus with diabetic polyneuropathy: Secondary | ICD-10-CM

## 2019-07-28 DIAGNOSIS — J0391 Acute recurrent tonsillitis, unspecified: Secondary | ICD-10-CM

## 2019-07-28 DIAGNOSIS — E1169 Type 2 diabetes mellitus with other specified complication: Secondary | ICD-10-CM

## 2019-07-28 DIAGNOSIS — K219 Gastro-esophageal reflux disease without esophagitis: Secondary | ICD-10-CM

## 2019-07-28 DIAGNOSIS — I152 Hypertension secondary to endocrine disorders: Secondary | ICD-10-CM

## 2019-07-28 DIAGNOSIS — B20 Human immunodeficiency virus [HIV] disease: Secondary | ICD-10-CM

## 2019-07-28 DIAGNOSIS — E1159 Type 2 diabetes mellitus with other circulatory complications: Secondary | ICD-10-CM

## 2019-07-28 MED ORDER — FLUCONAZOLE 150 MG TABLET
ORAL_TABLET | 0 refills | 0 days | Status: CP
Start: 2019-07-28 — End: ?

## 2019-07-28 MED ORDER — NYSTATIN 100,000 UNIT/ML ORAL SUSPENSION
Freq: Four times a day (QID) | ORAL | 0 refills | 6 days | Status: CP
Start: 2019-07-28 — End: ?

## 2019-07-28 MED ORDER — HYDROXYZINE HCL 25 MG TABLET
ORAL_TABLET | 0 refills | 0 days | Status: CP
Start: 2019-07-28 — End: ?

## 2019-07-28 MED ORDER — PREDNISONE 10 MG TABLET
ORAL_TABLET | 0 refills | 0 days | Status: CP
Start: 2019-07-28 — End: ?

## 2019-07-28 MED ORDER — PREDNISONE 10 MG (21) PO TBPK: ORAL_TABLET | ORAL | 0 refills | Status: DC

## 2019-07-28 MED ORDER — FUROSEMIDE 20 MG PO TABS: 20.0000 mg | ORAL_TABLET | Freq: Every day | ORAL | 2 refills | Status: DC

## 2019-07-28 MED ORDER — METFORMIN HCL 500 MG PO TABS: 500.0000 mg | ORAL_TABLET | Freq: Two times a day (BID) | ORAL | 2 refills | Status: DC

## 2019-07-28 MED ORDER — CETIRIZINE HCL 10 MG PO TABS: 10.0000 mg | ORAL_TABLET | Freq: Every day | ORAL | 4 refills | Status: DC

## 2019-07-28 MED ORDER — LISINOPRIL 40 MG PO TABS: 40.0000 mg | ORAL_TABLET | Freq: Every day | ORAL | 2 refills | Status: DC

## 2019-07-28 MED ORDER — ATORVASTATIN CALCIUM 10 MG PO TABS: 10.0000 mg | ORAL_TABLET | Freq: Every day | ORAL | 3 refills | Status: DC

## 2019-07-28 MED ORDER — GABAPENTIN 600 MG PO TABS: ORAL_TABLET | ORAL | 0 refills | Status: DC

## 2019-07-28 MED ORDER — OMEPRAZOLE 20 MG PO CPDR: DELAYED_RELEASE_CAPSULE | ORAL | 4 refills | Status: DC

## 2019-07-28 MED ORDER — AMLODIPINE BESYLATE 10 MG PO TABS: 10.0000 mg | ORAL_TABLET | Freq: Every day | ORAL | 3 refills | Status: DC

## 2019-07-28 NOTE — Progress Notes (Signed)
Virtual Visit via telephone Note Due to COVID-19 pandemic this visit was conducted virtually. This visit type was conducted due to national recommendations for restrictions regarding the COVID-19 Pandemic (e.g. social distancing, sheltering in place) in an effort to limit this patient's exposure and mitigate transmission in our community. All issues noted in this document were discussed and addressed.  A physical exam was not performed with this format.  I connected with Jasmine Peck on 07/28/19 at 9:20 AM  by telephone and verified that I am speaking with the correct person using two identifiers. Jasmine Peck is currently located at home and no one is currently with her during visit. The provider, [name], FNP is located in their office at time of visit.  I discussed the limitations, risks, security and privacy concerns of performing an evaluation and management service by telephone and the availability of in person appointments. I also discussed with the patient that there may be a patient responsible charge related to this service. The patient expressed understanding and agreed to proceed.   History and Present Illness:  Pt calls the office today for chronic follow up. Pt is HIV positive andis followed by Infectious Diseaseevery 3 months. She has been seen in the ED multiple times for tonsillitis.   She was seen yesterday for a rash that was diagnosed with an allergic reaction. She was given oral prednisone. She reports the rash has spread thighs, arms, and  back.   Rash This is a new problem. The current episode started yesterday. The problem has been gradually worsening since onset. The affected locations include the left arm, left lower leg, left upper leg, right lower leg and right upper leg. The rash is characterized by redness and itchiness. Pertinent negatives include no shortness of breath.  Diabetes She presents for her follow-up diabetic visit. She has type 2  diabetes mellitus. Her disease course has been stable. Hypoglycemia symptoms include nervousness/anxiousness. Pertinent negatives for diabetes include no blurred vision and no foot paresthesias. Symptoms are stable. Risk factors for coronary artery disease include diabetes mellitus, dyslipidemia, hypertension and sedentary lifestyle. She is following a generally unhealthy diet. Her overall blood glucose range is 110-130 mg/dl. Eye exam is not current.  Hypertension This is a chronic problem. The current episode started more than 1 year ago. The problem has been resolved since onset. The problem is controlled. Associated symptoms include anxiety and palpitations. Pertinent negatives include no blurred vision, malaise/fatigue, peripheral edema or shortness of breath. Risk factors for coronary artery disease include dyslipidemia, diabetes mellitus, obesity and sedentary lifestyle.  Gastroesophageal Reflux She complains of belching and heartburn. This is a chronic problem. The current episode started more than 1 year ago. The problem occurs occasionally. The problem has been waxing and waning. Risk factors include obesity. She has tried a PPI for the symptoms. The treatment provided moderate relief.  Anxiety Presents for follow-up visit. Symptoms include depressed mood, excessive worry, irritability, nervous/anxious behavior, palpitations and restlessness. Patient reports no shortness of breath. Symptoms occur occasionally. The severity of symptoms is moderate.    Nicotine Dependence Presents for follow-up visit. Symptoms include irritability. Her urge triggers include company of smokers. The symptoms have been stable. She smokes < 1/2 a pack of cigarettes per day. Compliance with prior treatments has been good.  Depression        This is a chronic problem.  The current episode started more than 1 year ago.   The onset quality is gradual.   The problem  occurs intermittently.  The problem has been waxing and  waning since onset.  Associated symptoms include irritable, restlessness and sad.  Associated symptoms include no helplessness and no hopelessness.  Past medical history includes anxiety.       Review of Systems  Constitutional: Positive for irritability. Negative for malaise/fatigue.  Eyes: Negative for blurred vision.  Respiratory: Negative for shortness of breath.   Cardiovascular: Positive for palpitations.  Gastrointestinal: Positive for heartburn.  Skin: Positive for rash.  Psychiatric/Behavioral: Positive for depression. The patient is nervous/anxious.   All other systems reviewed and are negative.    Observations/Objective: Pt crying, no SOB or distress  Assessment and Plan: 1. Recurrent tonsillitis - Ambulatory referral to ENT - CMP14+EGFR; Future - CBC with Differential/Platelet; Future  2. Enlarged tonsils - Ambulatory referral to ENT - CMP14+EGFR; Future - CBC with Differential/Platelet; Future  3. Rash Do not scratch  - CMP14+EGFR; Future - CBC with Differential/Platelet; Future - predniSONE (STERAPRED UNI-PAK 21 TAB) 10 MG (21) TBPK tablet; Use as directed  Dispense: 21 tablet; Refill: 0  4. Hospital discharge follow-up - CMP14+EGFR; Future - CBC with Differential/Platelet; Future  5. Hypertension associated with diabetes ([place]) - amLODipine (NORVASC) 10 MG tablet; Take 1 tablet (10 mg total) by mouth daily.  Dispense: 90 tablet; Refill: 3 - furosemide (LASIX) 20 MG tablet; Take 1 tablet (20 mg total) by mouth daily.  Dispense: 90 tablet; Refill: 2 - lisinopril (ZESTRIL) 40 MG tablet; Take 1 tablet (40 mg total) by mouth daily.  Dispense: 90 tablet; Refill: 2 - CMP14+EGFR; Future - CBC with Differential/Platelet; Future  6. Gastroesophageal reflux disease, unspecified whether esophagitis present - omeprazole (PRILOSEC) 20 MG capsule; TAKE ONE CAPSULE BY MOUTH EVERY DAY, NEEDS APPOINTMENT FOR REFILLS  Dispense: 90 capsule; Refill: 4 - CMP14+EGFR;  Future - CBC with Differential/Platelet; Future  7. Type 2 diabetes mellitus with diabetic polyneuropathy, without long-term current use of insulin (HCC) - metFORMIN (GLUCOPHAGE) 500 MG tablet; Take 1 tablet (500 mg total) by mouth 2 (two) times daily with a meal. TAKE TWO TABLETS BY MOUTH 2 TIMES A DAY WITH MEALS  Dispense: 180 tablet; Refill: 2 - Bayer DCA Hb A1c Waived; Future - CMP14+EGFR; Future - CBC with Differential/Platelet; Future - Microalbumin / creatinine urine ratio; Future  8. Hyperlipidemia associated with type 2 diabetes mellitus (HCC) - atorvastatin (LIPITOR) 10 MG tablet; Take 1 tablet (10 mg total) by mouth daily.  Dispense: 90 tablet; Refill: 3 - CMP14+EGFR; Future - CBC with Differential/Platelet; Future - Lipid panel; Future  9. Current smoker - CMP14+EGFR; Future - CBC with Differential/Platelet; Future  10. Moderate episode of recurrent major depressive disorder (HCC) - CMP14+EGFR; Future - CBC with Differential/Platelet; Future  11. GAD (generalized anxiety disorder) - CMP14+EGFR; Future - CBC with Differential/Platelet; Future  12. Human immunodeficiency virus (HIV) disease ([place]) - CMP14+EGFR; Future - CBC with Differential/Platelet; Future  13. Morbid obesity with BMI of 45.0-49.9, adult (HCC) - CMP14+EGFR; Future - CBC with Differential/Platelet; Future  14. Diabetic peripheral neuropathy (HCC) - gabapentin (NEURONTIN) 600 MG tablet; TAKE ONE TABLET BY MOUTH 3 TIMES A DAY (Needs to be seen before next refill)  Dispense: 90 tablet; Refill: 0 - CMP14+EGFR; Future - CBC with Differential/Platelet; Future  15. Allergic rhinitis due to pollen, unspecified seasonality - cetirizine (ZYRTEC) 10 MG tablet; Take 1 tablet (10 mg total) by mouth daily.  Dispense: 90 tablet; Refill: 4  Labs pending  Continue medications and keep specialists follow up RTO in 3 months, must be  face to face   I discussed the assessment and treatment plan with the patient.  The patient was provided an opportunity to ask questions and all were answered. The patient agreed with the plan and demonstrated an understanding of the instructions.   The patient was advised to call back or seek an in-person evaluation if the symptoms worsen or if the condition fails to improve as anticipated.  The above assessment and management plan was discussed with the patient. The patient verbalized understanding of and has agreed to the management plan. Patient is aware to call the clinic if symptoms persist or worsen. Patient is aware when to return to the clinic for a follow-up visit. Patient educated on when it is appropriate to go to the emergency department.   Time call ended:  9:44 AM  I provided 24 minutes of non-face-to-face time during this encounter.    [name], FNP

## 2019-07-31 ENCOUNTER — Emergency Department (HOSPITAL_COMMUNITY)
Admission: EM | Admit: 2019-07-31 | Discharge: 2019-07-31 | Disposition: A | Discharge status: Home / Self Care | Payer: Medicaid Other | Attending: Emergency Medicine | Admitting: Emergency Medicine

## 2019-07-31 ENCOUNTER — Encounter (HOSPITAL_COMMUNITY): Payer: Self-pay | Admitting: *Deleted

## 2019-07-31 ENCOUNTER — Emergency Department (HOSPITAL_COMMUNITY): Payer: Medicaid Other

## 2019-07-31 ENCOUNTER — Other Ambulatory Visit: Payer: Self-pay

## 2019-07-31 DIAGNOSIS — E1140 Type 2 diabetes mellitus with diabetic neuropathy, unspecified: Secondary | ICD-10-CM | POA: Insufficient documentation

## 2019-07-31 DIAGNOSIS — I10 Essential (primary) hypertension: Secondary | ICD-10-CM | POA: Diagnosis not present

## 2019-07-31 DIAGNOSIS — J0100 Acute maxillary sinusitis, unspecified: Secondary | ICD-10-CM | POA: Diagnosis not present

## 2019-07-31 DIAGNOSIS — Z7984 Long term (current) use of oral hypoglycemic drugs: Secondary | ICD-10-CM | POA: Insufficient documentation

## 2019-07-31 DIAGNOSIS — R0981 Nasal congestion: Secondary | ICD-10-CM | POA: Diagnosis present

## 2019-07-31 DIAGNOSIS — R062 Wheezing: Secondary | ICD-10-CM

## 2019-07-31 DIAGNOSIS — F17200 Nicotine dependence, unspecified, uncomplicated: Secondary | ICD-10-CM | POA: Diagnosis not present

## 2019-07-31 DIAGNOSIS — Z79899 Other long term (current) drug therapy: Secondary | ICD-10-CM | POA: Insufficient documentation

## 2019-07-31 DIAGNOSIS — R0989 Other specified symptoms and signs involving the circulatory and respiratory systems: Secondary | ICD-10-CM | POA: Diagnosis not present

## 2019-07-31 MED ORDER — AMOXICILLIN-POT CLAVULANATE 875-125 MG PO TABS: 1.0000 | ORAL_TABLET | Freq: Two times a day (BID) | ORAL | 0 refills | Status: DC

## 2019-07-31 MED ORDER — ALBUTEROL SULFATE HFA 108 (90 BASE) MCG/ACT IN AERS
2.0000 | INHALATION_SPRAY | Freq: Once | RESPIRATORY_TRACT | Status: AC
  Administered 2019-07-31: 2 via RESPIRATORY_TRACT
  Filled 2019-07-31: qty 6.7

## 2019-07-31 MED ORDER — AEROCHAMBER PLUS FLO-VU MEDIUM MISC
1.0000 | Freq: Once | Status: AC
  Administered 2019-07-31: 1
  Filled 2019-07-31: qty 1

## 2019-07-31 NOTE — Discharge Instructions (Addendum)
You are seen in the emergency department today for sinus pressure, congestion, and a cough with wheezing.  Your chest x-ray did not show signs of pneumonia.  We are sending you home with Augmentin which is an antibiotic, to treat the infection.  Please take all of your antibiotics until finished. You may develop abdominal discomfort or diarrhea from the antibiotic.  You may help offset this with probiotics which you can buy at the store (ask your pharmacist if unable to find) or get probiotics in the form of eating yogurt. Do not eat or take the probiotics until 2 hours after your antibiotic. If you are unable to tolerate these side effects follow-up with your primary care provider or return to the emergency department.   If you begin to experience any blistering, rashes, swelling, or difficulty breathing seek medical care for evaluation of potentially more serious side effects.   Please be aware that this medication may interact with other medications you are taking, please be sure to discuss your medication list with your pharmacist.   Please use the inhaler given in the emergency department 1 to 2 puffs every 4-6 hours as needed for wheezing or trouble breathing.  We would like you to be rechecked by your primary care provider within 3 days for reevaluation.  Return to the emergency department for new or worsening symptoms including but not limited to fevers, trouble breathing, chest pain, passing out, coughing up blood, or any other concerns.

## 2019-07-31 NOTE — ED Provider Notes (Signed)
[facility] Provider Note   CSN: [id number] Arrival date & time: 07/31/19  1206     History Chief Complaint  Patient presents with  . Nasal Congestion    Jasmine Peck is a 44 y.o. female with a anxiety, depression, T2DM, GERD, hypertension, & HIV which is currently very well controlled/undectable who presents to the emergency department with complaints of nasal congestion for the past 8 days.  Patient states she is having nasal congestion with green mucousy rhinorrhea, sinus pressure especially on the left side, productive cough, and wheezing at night.  There are no alleviating or aggravating factors to her symptoms.  Her mother and children are both sick with similar symptoms and have required antibiotics for improvement.  She denies fever, chills, sore throat, ear pain, dyspnea, or chest pain.  She currently is on nystatin and Diflucan for thrush.  HPI     Past Medical History:  Diagnosis Date  . Anxiety   . Depression   . Diabetes mellitus without complication ([place])   . GERD (gastroesophageal reflux disease)   . HIV infection ([place])   . Hypertension     Patient Active Problem List   Diagnosis Date Noted  . Hyperlipidemia associated with type 2 diabetes mellitus ([place]) 03/19/2017  . Diabetic peripheral neuropathy ([place]) 06/18/2016  . Current smoker 06/18/2016  . Morbid obesity with BMI of 45.0-49.9, adult ([place]) 10/24/2014  . Vitamin D deficiency 04/05/2014  . Hypertension associated with diabetes ([place]) 04/04/2014  . GERD (gastroesophageal reflux disease) 04/04/2014  . Diabetes mellitus, type 2 ([place]) 04/04/2014  . GAD (generalized anxiety disorder) 04/04/2014  . Depression 04/04/2014  . Human immunodeficiency virus (HIV) disease ([place]) 04/04/2014    Past Surgical History:  Procedure Laterality Date  . CHOLECYSTECTOMY       OB History   No obstetric history on file.     Family History  Problem Relation Age of Onset  . Heart disease Mother    . Diabetes Mother   . Hypertension Mother   . Early death Father   . Diabetes Father   . Hypertension Father   . Diabetes Brother   . Hypertension Brother   . Hyperlipidemia Brother   . Heart disease Maternal Grandmother   . Diabetes Maternal Grandmother   . Diabetes Paternal Grandfather   . Heart disease Paternal Grandfather     Social History   Tobacco Use  . Smoking status: Current Every Day Smoker  . Smokeless tobacco: Never Used  Substance Use Topics  . Alcohol use: No    Alcohol/week: 0.0 standard drinks  . Drug use: No    Home Medications Prior to Admission medications   Medication Sig Start Date End Date Taking? Authorizing Provider  abacavir-dolutegravir-lamiVUDine (TRIUMEQ) 600-50-300 MG tablet TAKE 1 TABLET BY MOUTH DAILY. 11/05/15  Yes [provider]  amLODipine (NORVASC) 10 MG tablet Take 1 tablet (10 mg total) by mouth daily. 07/28/19  Yes Hawks, Christy A, FNP  atorvastatin (LIPITOR) 10 MG tablet Take 1 tablet (10 mg total) by mouth daily. 07/28/19 07/27/20 Yes Hawks, Christy A, FNP  cetirizine (ZYRTEC) 10 MG tablet Take 1 tablet (10 mg total) by mouth daily. 07/28/19  Yes Hawks, Christy A, FNP  fluconazole (DIFLUCAN) 150 MG tablet Take 150 mg by mouth daily. For 5 daily. 07/28/19 08/01/19 Yes [provider]  furosemide (LASIX) 20 MG tablet Take 1 tablet (20 mg total) by mouth daily. 07/28/19  Yes Hawks, Christy A, FNP  gabapentin (NEURONTIN) 600 MG tablet  TAKE ONE TABLET BY MOUTH 3 TIMES A DAY (Needs to be seen before next refill) 07/28/19  Yes Hawks, Christy A, FNP  lisinopril (ZESTRIL) 40 MG tablet Take 1 tablet (40 mg total) by mouth daily. 07/28/19  Yes Hawks, Christy A, FNP  metFORMIN (GLUCOPHAGE) 500 MG tablet Take 1 tablet (500 mg total) by mouth 2 (two) times daily with a meal. TAKE TWO TABLETS BY MOUTH 2 TIMES A DAY WITH MEALS 07/28/19  Yes Hawks, Christy A, FNP  nystatin (MYCOSTATIN) 100000 UNIT/ML suspension Take 5 mLs by mouth 4 (four)  times daily. 07/28/19  Yes [provider]  omeprazole (PRILOSEC) 20 MG capsule TAKE ONE CAPSULE BY MOUTH EVERY DAY, NEEDS APPOINTMENT FOR REFILLS 07/28/19  Yes [name] A, FNP    Allergies    Clindamycin/lincomycin and Erythromycin  Review of Systems   Review of Systems  Constitutional: Negative for chills and fever.  HENT: Positive for congestion, rhinorrhea, sinus pressure and sinus pain. Negative for ear pain and sore throat.   Respiratory: Positive for cough and wheezing. Negative for shortness of breath.   Cardiovascular: Negative for chest pain.  Gastrointestinal: Negative for abdominal pain and vomiting.  Neurological: Negative for syncope.  All other systems reviewed and are negative.   Physical Exam Updated Vital Signs BP 122/80 (BP Location: Left Arm)   Pulse 84   Temp 98.5 F (36.9 C) (Oral)   Resp 20   Ht 5\' 9"  (1.753 m)   Wt 129.3 kg   LMP 07/31/2019   SpO2 99%   BMI 42.09 kg/m   Physical Exam Vitals and nursing note reviewed.  Constitutional:      General: She is not in acute distress.    Appearance: She is well-developed.  HENT:     Head: Normocephalic and atraumatic.     Right Ear: Ear canal normal. Tympanic membrane is not perforated, erythematous, retracted or bulging.     Left Ear: Ear canal normal. Tympanic membrane is not perforated, erythematous, retracted or bulging.     Ears:     Comments: No mastoid erythema/swelling/tenderness.     Nose: Mucosal edema and congestion present.     Right Sinus: No maxillary sinus tenderness or frontal sinus tenderness.     Left Sinus: Maxillary sinus tenderness present. No frontal sinus tenderness.     Mouth/Throat:     Pharynx: Uvula midline. No oropharyngeal exudate or posterior oropharyngeal erythema.     Comments: Posterior oropharynx is symmetric appearing. Patient tolerating own secretions without difficulty. No trismus. No drooling. No hot potato voice. No swelling beneath the tongue,  submandibular compartment is soft.  Eyes:     General:        Right eye: No discharge.        Left eye: No discharge.     Conjunctiva/sclera: Conjunctivae normal.     Pupils: Pupils are equal, round, and reactive to light.  Cardiovascular:     Rate and Rhythm: Normal rate and regular rhythm.     Heart sounds: No murmur heard.   Pulmonary:     Effort: Pulmonary effort is normal. No respiratory distress.     Breath sounds: Wheezing (Mild end expiratory at the bases.) present. No rhonchi or rales.  Abdominal:     General: There is no distension.     Palpations: Abdomen is soft.     Tenderness: There is no abdominal tenderness. There is no guarding or rebound.  Musculoskeletal:     Cervical back: Normal range of  motion and neck supple. No edema or rigidity.  Lymphadenopathy:     Cervical: No cervical adenopathy.  Skin:    General: Skin is warm and dry.     Findings: No rash.  Neurological:     Mental Status: She is alert.  Psychiatric:        Behavior: Behavior normal.    ED Results / Procedures / Treatments   Labs (all labs ordered are listed, but only abnormal results are displayed) Labs Reviewed - No data to display  EKG None  Radiology DG Chest [facility] 1 View  Result Date: 07/31/2019 CLINICAL DATA:  Nasal and chest congestion for 1 week EXAM: PORTABLE CHEST 1 VIEW COMPARISON:  Radiograph 06/03/2019 FINDINGS: No consolidation, features of edema, pneumothorax, or effusion. Pulmonary vascularity is normally distributed. The cardiomediastinal contours are unremarkable. No acute osseous or soft tissue abnormality. IMPRESSION: No acute cardiopulmonary abnormality. Electronically Signed   By: Lovena Le M.D.   On: 07/31/2019 15:15    Procedures Procedures (including critical care time)  Medications Ordered in ED Medications - No data to display  ED Course  I have reviewed the triage vital signs and the nursing notes.  Pertinent labs & imaging results that were available  during my care of the patient were reviewed by me and considered in my medical decision making (see chart for details).  Jasmine Peck was evaluated in Emergency Department on 07/31/2019 for the symptoms described in the history of present illness. He/she was evaluated in the context of the global COVID-19 pandemic, which necessitated consideration that the patient might be at risk for infection with the SARS-CoV-2 virus that causes COVID-19. Institutional protocols and algorithms that pertain to the evaluation of patients at risk for COVID-19 are in a state of rapid change based on information released by regulatory bodies including the CDC and federal and state organizations. These policies and algorithms were followed during the patient's care in the ED.    MDM Rules/Calculators/A&P                         Patient with history of well-controlled HIV as well as diabetes who presents to the emergency department with complaints of greater than 1 week of worsening nasal congestion/sinus pressure with cough.  She is nontoxic, resting comfortably, her vitals are within normal limits.  On exam she does have nasal congestion with mucosal edema and left-sided maxillary sinus tenderness.  End expiratory wheeze noted at the bases of her lungs.  She does not appear to be in respiratory distress.  I have ordered a chest x-ray, I personally reviewed and interpreted imaging .  She has no nuchal rigidity.  Ear and throat exam are reassuring.  Given her symptoms are worsening and have been present for over 1 week with her comorbidities do feel it is reasonable to treat her for acute bacterial sinusitis.  Will start patient on Augmentin.  We will have her continue utilizing inhaler at home for as needed wheezing.  We discussed the option of COVID-19 testing which she declined.  PCP follow-up with strict ED return precautions. I discussed results, treatment plan, need for follow-up, and return precautions with the patient.  Provided opportunity for questions, patient confirmed understanding and is in agreement with plan.   Final Clinical Impression(s) / ED Diagnoses Final diagnoses:  Acute maxillary sinusitis, recurrence not specified  Wheezing    Rx / DC Orders ED Discharge Orders  Ordered    amoxicillin-clavulanate (AUGMENTIN) 875-125 MG tablet  Every 12 hours     Discontinue  Reprint     07/31/19 [address] [place], PA-C 07/31/19 1540    Wyvonnia Dusky, MD 07/31/19 [phone]

## 2019-07-31 NOTE — ED Triage Notes (Signed)
Patient presents to the ED with nasal congestion, cough for one week.  Patient reports her family members have bronchitis.  Patient does not report a fever or covid exposure.

## 2019-08-01 ENCOUNTER — Telehealth: Payer: Self-pay | Admitting: *Deleted

## 2019-08-01 ENCOUNTER — Telehealth: Payer: Self-pay | Admitting: Family

## 2019-08-01 DIAGNOSIS — J329 Chronic sinusitis, unspecified: Secondary | ICD-10-CM

## 2019-08-01 NOTE — Telephone Encounter (Signed)
°  Transition Care Management Follow-up Telephone Call   [facility] Managed Care Transition Call Status:MM [facility] Call Made   Date of discharge and from where: [facility], 07/31/19   How have you been since you were released from the hospital? Not feeling better   Any questions or concerns? Still having dryness in nose and throat   Items Reviewed:  Did the pt receive and understand the discharge instructions provided? Yes   Medications obtained and verified? yes  Any new allergies since your discharge? no  Dietary orders reviewed? Yes  Do you have support at home? yes Functional Questionnaire: (I = Independent and D = Dependent)  ADLs: Independent Bathing/Dressing:Independent Meal Prep: Independent Eating: Independent Maintaining continence: Independent Transferring/Ambulation: Independent Managing Meds: Independent Follow up appointments reviewed:  PCP Hospital f/u appt confirmed? No, pt states she is waiting for a call back from her PCP  [facility] f/u appt confirmed? No; pt states she discussed seeing ENT with [name], 07/28/19  Are transportation arrangements needed? No   If their condition worsens, is the pt aware to call PCP or go to the EmergencyDept.? Yes  Was the patient provided with contact information for the PCP's office or ED? yes  Was to pt encouraged to call back with questions or concerns? Yes  Order placed for community care due to request for ENT.  Lenor Coffin, RN, BSN, [place] [facility] [place] [phone]

## 2019-08-01 NOTE — Telephone Encounter (Signed)
Pt states the issues with her nose and throat are unbearable and she is wanting to speak with [facility] personally regarding this issue.

## 2019-08-02 NOTE — Telephone Encounter (Signed)
Pt calls the office today with recurrent sinusitis. Discussed to continue the Augmentin and use nasal gel 5 times a day.

## 2019-08-03 ENCOUNTER — Other Ambulatory Visit: Payer: Medicaid Other

## 2019-08-03 ENCOUNTER — Other Ambulatory Visit: Payer: Self-pay

## 2019-08-03 DIAGNOSIS — Z09 Encounter for follow-up examination after completed treatment for conditions other than malignant neoplasm: Secondary | ICD-10-CM

## 2019-08-03 DIAGNOSIS — J351 Hypertrophy of tonsils: Secondary | ICD-10-CM

## 2019-08-03 DIAGNOSIS — E1159 Type 2 diabetes mellitus with other circulatory complications: Secondary | ICD-10-CM

## 2019-08-03 DIAGNOSIS — B20 Human immunodeficiency virus [HIV] disease: Secondary | ICD-10-CM

## 2019-08-03 DIAGNOSIS — F331 Major depressive disorder, recurrent, moderate: Secondary | ICD-10-CM

## 2019-08-03 DIAGNOSIS — F411 Generalized anxiety disorder: Secondary | ICD-10-CM

## 2019-08-03 DIAGNOSIS — N898 Other specified noninflammatory disorders of vagina: Secondary | ICD-10-CM

## 2019-08-03 DIAGNOSIS — J0391 Acute recurrent tonsillitis, unspecified: Secondary | ICD-10-CM

## 2019-08-03 DIAGNOSIS — E1142 Type 2 diabetes mellitus with diabetic polyneuropathy: Secondary | ICD-10-CM | POA: Diagnosis not present

## 2019-08-03 DIAGNOSIS — K219 Gastro-esophageal reflux disease without esophagitis: Secondary | ICD-10-CM

## 2019-08-03 DIAGNOSIS — R21 Rash and other nonspecific skin eruption: Secondary | ICD-10-CM

## 2019-08-03 DIAGNOSIS — E1169 Type 2 diabetes mellitus with other specified complication: Secondary | ICD-10-CM

## 2019-08-03 DIAGNOSIS — F17200 Nicotine dependence, unspecified, uncomplicated: Secondary | ICD-10-CM

## 2019-08-03 DIAGNOSIS — Z6841 Body Mass Index (BMI) 40.0 and over, adult: Secondary | ICD-10-CM

## 2019-08-03 LAB — URINALYSIS, COMPLETE
Bilirubin, UA: NEGATIVE
Glucose, UA: NEGATIVE
Ketones, UA: NEGATIVE
Leukocytes,UA: NEGATIVE
Nitrite, UA: NEGATIVE
Protein,UA: NEGATIVE
Specific Gravity, UA: 1.015 (ref 1.005–1.030)
Urobilinogen, Ur: 0.2 mg/dL (ref 0.2–1.0)
pH, UA: 6 (ref 5.0–7.5)

## 2019-08-03 LAB — MICROSCOPIC EXAMINATION
Bacteria, UA: NONE SEEN
Epithelial Cells (non renal): >10 /hpf — AB (ref 0–10)
RBC, Urine: NONE SEEN /hpf (ref 0–2)
Renal Epithel, UA: NONE SEEN /hpf

## 2019-08-03 LAB — BAYER DCA HB A1C WAIVED: HB A1C (BAYER DCA - WAIVED): 6.2 % (ref <7.0)

## 2019-08-03 LAB — WET PREP FOR TRICH, YEAST, CLUE
Clue Cell Exam: NEGATIVE
Trichomonas Exam: NEGATIVE
Yeast Exam: NEGATIVE

## 2019-08-04 LAB — CBC WITH DIFFERENTIAL/PLATELET
Basophils Absolute: 0 10*3/uL (ref 0.0–0.2)
Basos: 1 %
EOS (ABSOLUTE): 0 10*3/uL (ref 0.0–0.4)
Eos: 0 %
Hematocrit: 44.2 % (ref 34.0–46.6)
Hemoglobin: 15 g/dL (ref 11.1–15.9)
Immature Grans (Abs): 0 10*3/uL (ref 0.0–0.1)
Immature Granulocytes: 0 %
Lymphocytes Absolute: 2.4 10*3/uL (ref 0.7–3.1)
Lymphs: 41 %
MCH: 32.8 pg (ref 26.6–33.0)
MCHC: 33.9 g/dL (ref 31.5–35.7)
MCV: 97 fL (ref 79–97)
Monocytes Absolute: 0.6 10*3/uL (ref 0.1–0.9)
Monocytes: 10 %
Neutrophils Absolute: 2.8 10*3/uL (ref 1.4–7.0)
Neutrophils: 48 %
Platelets: 267 10*3/uL (ref 150–450)
RBC: 4.57 x10E6/uL (ref 3.77–5.28)
RDW: 13.4 % (ref 11.7–15.4)
WBC: 5.8 10*3/uL (ref 3.4–10.8)

## 2019-08-04 LAB — CMP14+EGFR
ALT: 25 IU/L (ref 0–32)
AST: 22 IU/L (ref 0–40)
Albumin/Globulin Ratio: 1.7 (ref 1.2–2.2)
Albumin: 4.3 g/dL (ref 3.8–4.8)
Alkaline Phosphatase: 95 IU/L (ref 48–121)
BUN/Creatinine Ratio: 8 — ABNORMAL LOW (ref 9–23)
BUN: 6 mg/dL (ref 6–24)
Bilirubin Total: 0.4 mg/dL (ref 0.0–1.2)
CO2: 25 mmol/L (ref 20–29)
Calcium: 9 mg/dL (ref 8.7–10.2)
Chloride: 103 mmol/L (ref 96–106)
Creatinine, Ser: 0.78 mg/dL (ref 0.57–1.00)
GFR calc Af Amer: 107 mL/min/{1.73_m2} (ref >59)
GFR calc non Af Amer: 93 mL/min/{1.73_m2} (ref >59)
Globulin, Total: 2.6 g/dL (ref 1.5–4.5)
Glucose: 107 mg/dL — ABNORMAL HIGH (ref 65–99)
Potassium: 3.5 mmol/L (ref 3.5–5.2)
Sodium: 143 mmol/L (ref 134–144)
Total Protein: 6.9 g/dL (ref 6.0–8.5)

## 2019-08-04 LAB — LIPID PANEL
Chol/HDL Ratio: 2.7 ratio (ref 0.0–4.4)
Cholesterol, Total: 120 mg/dL (ref 100–199)
HDL: 44 mg/dL (ref >39)
LDL Chol Calc (NIH): 53 mg/dL (ref 0–99)
Triglycerides: 129 mg/dL (ref 0–149)
VLDL Cholesterol Cal: 23 mg/dL (ref 5–40)

## 2019-08-04 LAB — MICROALBUMIN / CREATININE URINE RATIO
Creatinine, Urine: 123.8 mg/dL
Microalb/Creat Ratio: 15 mg/g creat (ref 0–29)
Microalbumin, Urine: 18.5 ug/mL

## 2019-08-05 ENCOUNTER — Telehealth: Payer: Self-pay | Admitting: *Deleted

## 2019-08-05 DIAGNOSIS — J3489 Other specified disorders of nose and nasal sinuses: Secondary | ICD-10-CM | POA: Diagnosis not present

## 2019-08-05 DIAGNOSIS — J329 Chronic sinusitis, unspecified: Secondary | ICD-10-CM | POA: Diagnosis not present

## 2019-08-05 DIAGNOSIS — Z21 Asymptomatic human immunodeficiency virus [HIV] infection status: Secondary | ICD-10-CM | POA: Diagnosis not present

## 2019-08-05 NOTE — Telephone Encounter (Signed)
Call received from Etheleen Sia in response to [facility] general discharge transition call. Patient  already has had MM assessment done by Lenor Coffin, RN on 08/01/19.                                [place]                           [phone]

## 2019-08-11 ENCOUNTER — Ambulatory Visit (INDEPENDENT_AMBULATORY_CARE_PROVIDER_SITE_OTHER): Payer: Medicaid Other | Admitting: Family

## 2019-08-11 ENCOUNTER — Encounter: Payer: Self-pay | Admitting: Family

## 2019-08-11 ENCOUNTER — Other Ambulatory Visit: Payer: Self-pay

## 2019-08-11 VITALS — BP 122/80 | HR 104 | Temp 98.2°F | Ht 69.0 in | Wt 284.4 lb

## 2019-08-11 DIAGNOSIS — B370 Candidal stomatitis: Secondary | ICD-10-CM | POA: Diagnosis not present

## 2019-08-11 DIAGNOSIS — N898 Other specified noninflammatory disorders of vagina: Secondary | ICD-10-CM | POA: Diagnosis not present

## 2019-08-11 LAB — WET PREP FOR TRICH, YEAST, CLUE
Clue Cell Exam: NEGATIVE
Trichomonas Exam: NEGATIVE
Yeast Exam: NEGATIVE

## 2019-08-11 NOTE — Progress Notes (Signed)
Subjective:    Patient ID: Jasmine Peck, female    DOB: 10/27/1975, 44 y.o.   MRN: [id number]  Chief Complaint  Patient presents with   Thrush   vaginal burning    3 days    Mouth Lesions    3 days   Vaginal Discharge    clear   Pt presents to the office today with recurrent vaginal discharge and mouth sores. She was has had three different Antibiotics over the month and half. She saw ENT on 08/05/19 and was diagnosed with oral thrush. She was given nystatin mouthwash. She reports her mouth was getting better.  Mouth Lesions  The current episode started 5 to 7 days ago. Associated symptoms include mouth sores. Pertinent negatives include no constipation, no diarrhea and no nausea.  Vaginal Discharge The patient's primary symptoms include vaginal discharge. The patient's pertinent negatives include no genital itching. This is a new problem. The current episode started in the past 7 days. The problem occurs intermittently. Associated symptoms include frequency. Pertinent negatives include no constipation, diarrhea, dysuria, flank pain, hematuria, nausea, painful intercourse or urgency. The vaginal discharge was thick and watery. She has tried antifungals for the symptoms. The treatment provided mild relief.      Review of Systems  HENT: Positive for mouth sores.   Gastrointestinal: Negative for constipation, diarrhea and nausea.  Genitourinary: Positive for frequency and vaginal discharge. Negative for dysuria, flank pain, hematuria and urgency.  All other systems reviewed and are negative.      Objective:   Physical Exam Vitals reviewed.  Constitutional:      General: She is not in acute distress.    Appearance: She is well-developed.  HENT:     Head: Normocephalic and atraumatic.     Right Ear: Tympanic membrane normal.     Left Ear: Tympanic membrane normal.     Mouth/Throat:     Comments: Raised skin color spot on bilateral cheeks. No pain, erythemas  present.  White present on tongue Eyes:     Pupils: Pupils are equal, round, and reactive to light.  Neck:     Thyroid: No thyromegaly.  Cardiovascular:     Rate and Rhythm: Normal rate and regular rhythm.     Heart sounds: Normal heart sounds. No murmur heard.   Pulmonary:     Effort: Pulmonary effort is normal. No respiratory distress.     Breath sounds: Normal breath sounds. No wheezing.  Abdominal:     General: Bowel sounds are normal. There is no distension.     Palpations: Abdomen is soft.     Tenderness: There is no abdominal tenderness.  Musculoskeletal:        General: No tenderness. Normal range of motion.     Cervical back: Normal range of motion and neck supple.  Skin:    General: Skin is warm and dry.  Neurological:     Mental Status: She is alert and oriented to person, place, and time.     Cranial Nerves: No cranial nerve deficit.     Deep Tendon Reflexes: Reflexes are normal and symmetric.  Psychiatric:        Behavior: Behavior normal.        Thought Content: Thought content normal.        Judgment: Judgment normal.       BP 122/80    Pulse 104    Temp 98.2 F (36.8 C) (Temporal)    Ht 5\' 9"  (1.753 m)  Wt (!) 284 lb 6.4 oz (129 kg)    LMP 07/31/2019    SpO2 95%    BMI 42.00 kg/m      Assessment & Plan:  [name] comes in today with chief complaint of Thrush, vaginal burning (3 days ), Mouth Lesions (3 days), and Vaginal Discharge (clear)   Diagnosis and orders addressed:  1. Vaginal itching Yeast resolved - WET PREP FOR TRICH, YEAST, CLUE  2. Oral thrush Continue nystatin solution Keep follow up with specialists  Let me know if these do not resolve    [name], FNP

## 2019-08-11 NOTE — Patient Instructions (Signed)
Oral Thrush, Adult  Oral thrush, also called oral candidiasis, is a fungal infection that develops in the mouth and throat and on the tongue. It causes white patches to form on the mouth and tongue. Thrush is most common in older adults, but it can occur at any age. Many cases of thrush are mild, but this infection can also be serious. Thrush can be a repeated (recurrent) problem for certain people who have a weak body defense system (immune system). The weakness can be caused by chronic illnesses, or by taking medicines that limit the body's ability to fight infection. If a person has difficulty fighting infection, the fungus that causes thrush can spread through the body. This can cause life-threatening blood or organ infections. What are the causes? This condition is caused by a fungus (yeast) called Candida albicans.  This fungus is normally present in small amounts in the mouth and on other mucous membranes. It usually causes no harm.  If conditions are present that allow the fungus to grow without control, it invades surrounding tissues and becomes an infection.  Other Candida species can also lead to thrush (rare). What increases the risk? This condition is more likely to develop in:  People with a weakened immune system.  Older adults.  People with HIV (human immunodeficiency virus).  People with diabetes.  People with dry mouth (xerostomia).  Pregnant women.  People with poor dental care, especially people who have false teeth.  People who use antibiotic medicines. What are the signs or symptoms? Symptoms of this condition can vary from mild and moderate to severe and persistent. Symptoms may include:  A burning feeling in the mouth and throat. This can occur at the start of a thrush infection.  White patches that stick to the mouth and tongue. The tissue around the patches may be red, raw, and painful. If rubbed (during tooth brushing, for example), the patches and the  tissue of the mouth may bleed easily.  A bad taste in the mouth or difficulty tasting foods.  A cottony feeling in the mouth.  Pain during eating and swallowing.  Poor appetite.  Cracking at the corners of the mouth. How is this diagnosed? This condition is diagnosed based on:  Physical exam. Your health care provider will look in your mouth.  Health history. Your health care provider will ask you questions about your health. How is this treated? This condition is treated with medicines called antifungals, which prevent the growth of fungi. These medicines are either applied directly to the affected area (topical) or swallowed (oral). The treatment will depend on the severity of the condition. Mild thrush Mild cases of thrush may clear up with the use of an antifungal mouth rinse or lozenges. Treatment usually lasts about 14 days. Moderate to severe thrush  More severe thrush infections that have spread to the esophagus are treated with an oral antifungal medicine. A topical antifungal medicine may also be used.  For some severe infections, treatment may need to continue for more than 14 days.  Oral antifungal medicines are rarely used during pregnancy because they may be harmful to the unborn child. If you are pregnant, talk with your health care provider about options for treatment. Persistent or recurrent thrush For cases of thrush that do not go away or keep coming back:  Treatment may be needed twice as long as the symptoms last.  Treatment will include both oral and topical antifungal medicines.  People with a weakened immune system can take   an antifungal medicine on a continuous basis to prevent thrush infections. It is important to treat conditions that make a person more likely to get thrush, such as diabetes or HIV. Follow these instructions at home: Medicines  Take over-the-counter and prescription medicines only as told by your health care provider.  Talk with  your health care provider about an over-the-counter medicine called gentian violet, which kills bacteria and fungi. Relieving soreness and discomfort To help reduce the discomfort of thrush:  Drink cold liquids such as water or iced tea.  Try flavored ice treats or frozen juices.  Eat foods that are easy to swallow, such as gelatin, ice cream, or custard.  Try drinking from a straw if the patches in your mouth are painful.  General instructions  Eat plain, unflavored yogurt as directed by your health care provider. Check the label to make sure the yogurt contains live cultures. This yogurt can help healthy bacteria to grow in the mouth and can stop the growth of the fungus that causes thrush.  If you wear dentures, remove the dentures before going to bed, brush them vigorously, and soak them in a cleaning solution as directed by your health care provider.  Rinse your mouth with a warm salt-water mixture several times a day. To make a salt-water mixture, completely dissolve 1/2-1 tsp of salt in 1 cup of warm water. Contact a health care provider if:  Your symptoms are getting worse or are not improving within 7 days of starting treatment.  You have symptoms of a spreading infection, such as white patches on the skin outside of the mouth. This information is not intended to replace advice given to you by your health care provider. Make sure you discuss any questions you have with your health care provider. Document Revised: 04/03/2017 Document Reviewed: 09/24/2015 Elsevier Patient Education  2020 [employer]  

## 2019-08-16 ENCOUNTER — Encounter: Payer: Self-pay | Admitting: Family Medicine

## 2019-08-16 ENCOUNTER — Ambulatory Visit (INDEPENDENT_AMBULATORY_CARE_PROVIDER_SITE_OTHER): Payer: Medicaid Other | Admitting: Family Medicine

## 2019-08-16 DIAGNOSIS — B370 Candidal stomatitis: Secondary | ICD-10-CM

## 2019-08-16 NOTE — Progress Notes (Signed)
Virtual Visit via Telephone Note  I connected with Jasmine Peck on 08/16/19 at 1:38 PM by telephone and verified that I am speaking with the correct person using two identifiers. Jasmine Peck is currently located at [facility] and nobody is currently with her during this visit. The provider, Loman Brooklyn, FNP is located in their office at time of visit.  I discussed the limitations, risks, security and privacy concerns of performing an evaluation and management service by telephone and the availability of in person appointments. I also discussed with the patient that there may be a patient responsible charge related to this service. The patient expressed understanding and agreed to proceed.  Subjective: PCP: Sharion Balloon, FNP  Chief Complaint  Patient presents with  . Thrush   Patient reports she has been dealing with thrush for weeks. She has been using Nystatin QID and she took Diflucan x5 days. She has an appointment scheduled with her ENT in 2 days. She does have a h/o HIV.     ROS: Per HPI  Current Outpatient Medications:  .  abacavir-dolutegravir-lamiVUDine (TRIUMEQ) 600-50-300 MG tablet, TAKE 1 TABLET BY MOUTH DAILY., Disp: , Rfl:  .  amLODipine (NORVASC) 10 MG tablet, Take 1 tablet (10 mg total) by mouth daily., Disp: 90 tablet, Rfl: 3 .  atorvastatin (LIPITOR) 10 MG tablet, Take 1 tablet (10 mg total) by mouth daily., Disp: 90 tablet, Rfl: 3 .  cetirizine (ZYRTEC) 10 MG tablet, Take 1 tablet (10 mg total) by mouth daily., Disp: 90 tablet, Rfl: 4 .  furosemide (LASIX) 20 MG tablet, Take 1 tablet (20 mg total) by mouth daily., Disp: 90 tablet, Rfl: 2 .  gabapentin (NEURONTIN) 600 MG tablet, TAKE ONE TABLET BY MOUTH 3 TIMES A DAY (Needs to be seen before next refill), Disp: 90 tablet, Rfl: 0 .  lisinopril (ZESTRIL) 40 MG tablet, Take 1 tablet (40 mg total) by mouth daily., Disp: 90 tablet, Rfl: 2 .  metFORMIN (GLUCOPHAGE) 500 MG tablet, Take 1 tablet (500 mg total)  by mouth 2 (two) times daily with a meal. TAKE TWO TABLETS BY MOUTH 2 TIMES A DAY WITH MEALS, Disp: 180 tablet, Rfl: 2 .  nystatin (MYCOSTATIN) 100000 UNIT/ML suspension, Take 5 mLs by mouth 4 (four) times daily., Disp: , Rfl:  .  omeprazole (PRILOSEC) 20 MG capsule, TAKE ONE CAPSULE BY MOUTH EVERY DAY, NEEDS APPOINTMENT FOR REFILLS, Disp: 90 capsule, Rfl: 4  Allergies  Allergen Reactions  . Clindamycin/Lincomycin     rash  . Erythromycin Other (See Comments)    Abdominal pain   Past Medical History:  Diagnosis Date  . Anxiety   . Depression   . Diabetes mellitus without complication ([place])   . GERD (gastroesophageal reflux disease)   . HIV infection ([place])   . Hypertension     Observations/Objective: A&O  No respiratory distress or wheezing audible over the phone Mood, judgement, and thought processes all WNL   Assessment and Plan: 1. Oral thrush - Encouraged to keep appointment with ENT and continue Nystatin until then.    Follow Up Instructions:  I discussed the assessment and treatment plan with the patient. The patient was provided an opportunity to ask questions and all were answered. The patient agreed with the plan and demonstrated an understanding of the instructions.   The patient was advised to call back or seek an in-person evaluation if the symptoms worsen or if the condition fails to improve as anticipated.  The above assessment and  management plan was discussed with the patient. The patient verbalized understanding of and has agreed to the management plan. Patient is aware to call the clinic if symptoms persist or worsen. Patient is aware when to return to the clinic for a follow-up visit. Patient educated on when it is appropriate to go to the emergency department.   Time call ended: 1:46 PM  I provided 10 minutes of non-face-to-face time during this encounter.  Hendricks Limes, MSN, APRN, FNP-C [facility] 08/16/19

## 2019-08-18 ENCOUNTER — Other Ambulatory Visit: Payer: Self-pay | Admitting: Family

## 2019-08-18 DIAGNOSIS — B370 Candidal stomatitis: Secondary | ICD-10-CM | POA: Diagnosis not present

## 2019-08-18 DIAGNOSIS — J329 Chronic sinusitis, unspecified: Secondary | ICD-10-CM | POA: Diagnosis not present

## 2019-08-19 ENCOUNTER — Ambulatory Visit: Payer: Medicaid Other | Admitting: Family

## 2019-08-20 ENCOUNTER — Other Ambulatory Visit: Payer: Self-pay | Admitting: Family

## 2019-08-20 DIAGNOSIS — E1142 Type 2 diabetes mellitus with diabetic polyneuropathy: Secondary | ICD-10-CM

## 2019-08-22 ENCOUNTER — Encounter: Payer: Self-pay | Admitting: Family

## 2019-08-29 ENCOUNTER — Encounter: Payer: Self-pay | Admitting: Family

## 2019-08-29 ENCOUNTER — Ambulatory Visit (INDEPENDENT_AMBULATORY_CARE_PROVIDER_SITE_OTHER): Payer: Medicaid Other | Admitting: Family

## 2019-08-29 NOTE — Progress Notes (Signed)
Called multiple times, no answer or call back.

## 2019-09-05 ENCOUNTER — Other Ambulatory Visit: Payer: Self-pay | Admitting: Family

## 2019-09-05 DIAGNOSIS — I152 Hypertension secondary to endocrine disorders: Secondary | ICD-10-CM

## 2019-09-23 ENCOUNTER — Other Ambulatory Visit: Payer: Self-pay | Admitting: Family

## 2019-09-23 DIAGNOSIS — Z1231 Encounter for screening mammogram for malignant neoplasm of breast: Secondary | ICD-10-CM

## 2019-10-11 ENCOUNTER — Encounter: Payer: Self-pay | Admitting: *Deleted

## 2019-10-19 DIAGNOSIS — J351 Hypertrophy of tonsils: Secondary | ICD-10-CM | POA: Diagnosis not present
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
## 2019-10-28 ENCOUNTER — Ambulatory Visit: Payer: Medicaid Other | Admitting: Family

## 2019-11-02 ENCOUNTER — Other Ambulatory Visit: Payer: Self-pay | Admitting: Family

## 2019-11-02 DIAGNOSIS — E1159 Type 2 diabetes mellitus with other circulatory complications: Secondary | ICD-10-CM

## 2019-11-07 ENCOUNTER — Other Ambulatory Visit: Payer: Self-pay

## 2019-11-07 ENCOUNTER — Ambulatory Visit: Payer: Self-pay

## 2019-11-07 ENCOUNTER — Telehealth: Payer: Self-pay

## 2019-11-07 ENCOUNTER — Ambulatory Visit: Payer: Medicaid Other | Admitting: Rheumatology

## 2019-11-07 ENCOUNTER — Encounter: Payer: Self-pay | Admitting: Rheumatology

## 2019-11-07 VITALS — BP 124/85 | HR 83 | Resp 16 | Ht 67.0 in | Wt 273.4 lb

## 2019-11-07 DIAGNOSIS — F411 Generalized anxiety disorder: Secondary | ICD-10-CM | POA: Diagnosis not present

## 2019-11-07 DIAGNOSIS — L4050 Arthropathic psoriasis, unspecified: Secondary | ICD-10-CM

## 2019-11-07 DIAGNOSIS — E559 Vitamin D deficiency, unspecified: Secondary | ICD-10-CM | POA: Diagnosis not present

## 2019-11-07 DIAGNOSIS — M79641 Pain in right hand: Secondary | ICD-10-CM

## 2019-11-07 DIAGNOSIS — B20 Human immunodeficiency virus [HIV] disease: Secondary | ICD-10-CM | POA: Diagnosis not present

## 2019-11-07 DIAGNOSIS — E1142 Type 2 diabetes mellitus with diabetic polyneuropathy: Secondary | ICD-10-CM | POA: Diagnosis not present

## 2019-11-07 DIAGNOSIS — I152 Hypertension secondary to endocrine disorders: Secondary | ICD-10-CM

## 2019-11-07 DIAGNOSIS — Z8719 Personal history of other diseases of the digestive system: Secondary | ICD-10-CM | POA: Diagnosis not present

## 2019-11-07 DIAGNOSIS — L409 Psoriasis, unspecified: Secondary | ICD-10-CM | POA: Diagnosis not present

## 2019-11-07 DIAGNOSIS — E1159 Type 2 diabetes mellitus with other circulatory complications: Secondary | ICD-10-CM | POA: Diagnosis not present

## 2019-11-07 DIAGNOSIS — Z79899 Other long term (current) drug therapy: Secondary | ICD-10-CM | POA: Diagnosis not present

## 2019-11-07 DIAGNOSIS — M25561 Pain in right knee: Secondary | ICD-10-CM

## 2019-11-07 DIAGNOSIS — E785 Hyperlipidemia, unspecified: Secondary | ICD-10-CM

## 2019-11-07 DIAGNOSIS — G4733 Obstructive sleep apnea (adult) (pediatric): Secondary | ICD-10-CM

## 2019-11-07 DIAGNOSIS — M2550 Pain in unspecified joint: Secondary | ICD-10-CM

## 2019-11-07 DIAGNOSIS — G2581 Restless legs syndrome: Secondary | ICD-10-CM

## 2019-11-07 DIAGNOSIS — Z840 Family history of diseases of the skin and subcutaneous tissue: Secondary | ICD-10-CM

## 2019-11-07 DIAGNOSIS — E1169 Type 2 diabetes mellitus with other specified complication: Secondary | ICD-10-CM

## 2019-11-07 DIAGNOSIS — G8929 Other chronic pain: Secondary | ICD-10-CM

## 2019-11-07 DIAGNOSIS — F331 Major depressive disorder, recurrent, moderate: Secondary | ICD-10-CM

## 2019-11-07 DIAGNOSIS — Z7189 Other specified counseling: Secondary | ICD-10-CM

## 2019-11-07 DIAGNOSIS — F17200 Nicotine dependence, unspecified, uncomplicated: Secondary | ICD-10-CM

## 2019-11-07 NOTE — Telephone Encounter (Signed)
Please apply for Otezla per Dr. Estanislado Pandy.   Consent obtained and sent the scan center.   We are awaiting clearance from Dr. [name] (infectious disease) for patient to start otezla.

## 2019-11-07 NOTE — Telephone Encounter (Signed)
Submitted a Prior Authorization request to [employer] for OTEZLA via Cover My Meds. Will update once we receive a response.  (KeyMarena Chancy) - [id number]

## 2019-11-07 NOTE — Telephone Encounter (Signed)
Patient sees Dr. [name] for infectious disease management. I called their office and spoke with the triage RN advising that we need clearance for patient to start Lifescape for psoriatic arthritis and psoriasis. The nurse will consult with the doctor and call our office with information.

## 2019-11-07 NOTE — Patient Instructions (Addendum)
Apremilast oral tablets What is this medicine? APREMILAST (a PRE mil ast) is used to treat plaque psoriasis, psoriatic arthritis, and certain oral ulcers. This medicine may be used for other purposes; ask your health care provider or pharmacist if you have questions. COMMON BRAND NAME(S): Rutherford Nail What should I tell my health care provider before I take this medicine? They need to know if you have any of these conditions:  dehydration  kidney disease  mental illness  an unusual or allergic reaction to apremilast, other medicines, foods, dyes, or preservatives  pregnant or trying to get pregnant  breast-feeding How should I use this medicine? Take this medicine by mouth with a glass of water. Follow the directions on the prescription label. Do not cut, crush or chew this medicine. You can take it with or without food. If it upsets your stomach, take it with food. Take your medicine at regular intervals. Do not take it more often than directed. Do not stop taking except on your doctor's advice. Talk to your pediatrician regarding the use of this medicine in children. Special care may be needed. Overdosage: If you think you have taken too much of this medicine contact a poison control center or emergency room at once. NOTE: This medicine is only for you. Do not share this medicine with others. What if I miss a dose? If you miss a dose, take it as soon as you can. If it is almost time for your next dose, take only that dose. Do not take double or extra doses. What may interact with this medicine? This medicine may interact with the following medications:  certain medicines for seizures like carbamazepine, phenobarbital, phenytoin  rifampin This list may not describe all possible interactions. Give your health care provider a list of all the medicines, herbs, non-prescription drugs, or dietary supplements you use. Also tell them if you smoke, drink alcohol, or use illegal drugs. Some items  may interact with your medicine. What should I watch for while using this medicine? Tell your doctor or healthcare professional if your symptoms do not start to get better or if they get worse. Patients and their families should watch out for new or worsening depression or thoughts of suicide. Also watch out for sudden changes in feelings such as feeling anxious, agitated, panicky, irritable, hostile, aggressive, impulsive, severely restless, overly excited and hyperactive, or not being able to sleep. If this happens, call your health care professional. Check with your doctor or health care professional if you get an attack of severe diarrhea, nausea and vomiting, or if you sweat a lot. The loss of too much body fluid can make it dangerous for you to take this medicine. What side effects may I notice from receiving this medicine? Side effects that you should report to your doctor or health care professional as soon as possible:  depressed mood  weight loss Side effects that usually do not require medical attention (report to your doctor or health care professional if they continue or are bothersome):  diarrhea  headache  nausea, vomiting This list may not describe all possible side effects. Call your doctor for medical advice about side effects. You may report side effects to FDA at 1-800-FDA-1088. Where should I keep my medicine? Keep out of the reach of children. Store below 30 degrees C (86 degrees F). Throw away any unused medicine after the expiration date. NOTE: This sheet is a summary. It may not cover all possible information. If you have questions about this  medicine, talk to your doctor, pharmacist, or health care provider.  2020 Elsevier/Gold Standard (2017-08-03 12:47:14)   COVID-19 vaccine recommendations:   COVID-19 vaccine is recommended for everyone (unless you are allergic to a vaccine component), even if you are on a medication that suppresses your immune system.   If  you are on Methotrexate, Cellcept (mycophenolate), Rinvoq, Morrie Sheldon, and Olumiant- hold the medication for 1 week after each vaccine. Hold Methotrexate for 2 weeks after the single dose COVID-19 vaccine.   If you are on Orencia subcutaneous injection - hold medication one week prior to and one week after the first COVID-19 vaccine dose (only).   If you are on Orencia IV infusions- time vaccination administration so that the first COVID-19 vaccination will occur four weeks after the infusion and postpone the subsequent infusion by one week.   If you are on Cyclophosphamide or Rituxan infusions please contact your doctor prior to receiving the COVID-19 vaccine.   Do not take Tylenol or any anti-inflammatory medications (NSAIDs) 24 hours prior to the COVID-19 vaccination.   There is no direct evidence about the efficacy of the COVID-19 vaccine in individuals who are on medications that suppress the immune system.   Even if you are fully vaccinated, and you are on any medications that suppress your immune system, please continue to wear a mask, maintain at least six feet social distance and practice hand hygiene.   If you develop a COVID-19 infection, please contact your PCP or our office to determine if you need antibody infusion.  The booster vaccine is now available for immunocompromised patients. It is advised that if you had Pfizer vaccine you should get Coca-Cola booster.  If you had a Moderna vaccine then you should get a Moderna booster. Johnson and Wynetta Emery does not have a booster vaccine at this time.  Please see the following web sites for updated information.   https://www.[email or web address]/Portals/0/Files/COVID-19-Vaccination-Patient-Resources.pdf  https://[email or web address].org/About-Us/Newsroom/Press-Releases/ID/1159

## 2019-11-08 NOTE — Progress Notes (Signed)
I will discuss results at the follow-up visit.

## 2019-11-08 NOTE — Telephone Encounter (Signed)
Received notification from [facility] regarding a prior authorization for [place]. Authorization has been APPROVED from 11/07/19 to 11/06/20. Authorization is for starter kit and maintenance dose.  Authorization # [id number]  Patient's copay is $3.00 for 1 month supply. Patient can fill through [facility].

## 2019-11-10 ENCOUNTER — Telehealth: Payer: Self-pay

## 2019-11-10 LAB — IGG, IGA, IGM
IgG (Immunoglobin G), Serum: 798 mg/dL (ref 600–1640)
IgM, Serum: 72 mg/dL (ref 50–300)
Immunoglobulin A: 319 mg/dL — ABNORMAL HIGH (ref 47–310)

## 2019-11-10 LAB — PROTEIN ELECTROPHORESIS, SERUM, WITH REFLEX
Abnormal Protein Band1: 0.4 g/dL — ABNORMAL HIGH
Albumin ELP: 4.2 g/dL (ref 3.8–4.8)
Alpha 1: 0.4 g/dL — ABNORMAL HIGH (ref 0.2–0.3)
Alpha 2: 1 g/dL — ABNORMAL HIGH (ref 0.5–0.9)
Beta 2: 0.3 g/dL (ref 0.2–0.5)
Beta Globulin: 0.5 g/dL (ref 0.4–0.6)
Gamma Globulin: 0.9 g/dL (ref 0.8–1.7)
Total Protein: 7.3 g/dL (ref 6.1–8.1)

## 2019-11-10 LAB — CBC WITH DIFFERENTIAL/PLATELET
Absolute Monocytes: 727 cells/uL (ref 200–950)
Basophils Absolute: 83 cells/uL (ref 0–200)
Basophils Relative: 0.9 %
Eosinophils Absolute: 83 cells/uL (ref 15–500)
Eosinophils Relative: 0.9 %
HCT: 42.9 % (ref 35.0–45.0)
Hemoglobin: 14.4 g/dL (ref 11.7–15.5)
Lymphs Abs: 2852 cells/uL (ref 850–3900)
MCH: 33.3 pg — ABNORMAL HIGH (ref 27.0–33.0)
MCHC: 33.6 g/dL (ref 32.0–36.0)
MCV: 99.1 fL (ref 80.0–100.0)
MPV: 11.5 fL (ref 7.5–12.5)
Monocytes Relative: 7.9 %
Neutro Abs: 5456 cells/uL (ref 1500–7800)
Neutrophils Relative %: 59.3 %
Platelets: 374 10*3/uL (ref 140–400)
RBC: 4.33 10*6/uL (ref 3.80–5.10)
RDW: 13 % (ref 11.0–15.0)
Total Lymphocyte: 31 %
WBC: 9.2 10*3/uL (ref 3.8–10.8)

## 2019-11-10 LAB — COMPLETE METABOLIC PANEL WITH GFR
AG Ratio: 1.6 (calc) (ref 1.0–2.5)
ALT: 9 U/L (ref 6–29)
AST: 11 U/L (ref 10–30)
Albumin: 4.5 g/dL (ref 3.6–5.1)
Alkaline phosphatase (APISO): 76 U/L (ref 31–125)
BUN: 11 mg/dL (ref 7–25)
CO2: 27 mmol/L (ref 20–32)
Calcium: 9.9 mg/dL (ref 8.6–10.2)
Chloride: 102 mmol/L (ref 98–110)
Creat: 0.73 mg/dL (ref 0.50–1.10)
GFR, Est African American: 116 mL/min/{1.73_m2} (ref >=60)
GFR, Est Non African American: 100 mL/min/{1.73_m2} (ref >=60)
Globulin: 2.8 g/dL (calc) (ref 1.9–3.7)
Glucose, Bld: 97 mg/dL (ref 65–99)
Potassium: 4.1 mmol/L (ref 3.5–5.3)
Sodium: 141 mmol/L (ref 135–146)
Total Bilirubin: 0.5 mg/dL (ref 0.2–1.2)
Total Protein: 7.3 g/dL (ref 6.1–8.1)

## 2019-11-10 LAB — HEPATITIS C ANTIBODY
Hepatitis C Ab: NONREACTIVE
SIGNAL TO CUT-OFF: 0 (ref <1.00)

## 2019-11-10 LAB — HEPATITIS B CORE ANTIBODY, IGM: Hep B C IgM: NONREACTIVE

## 2019-11-10 LAB — QUANTIFERON-TB GOLD PLUS
Mitogen-NIL: >10 IU/mL
NIL: 0.01 IU/mL
QuantiFERON-TB Gold Plus: NEGATIVE
TB1-NIL: 0 IU/mL
TB2-NIL: 0.01 IU/mL

## 2019-11-10 LAB — CYCLIC CITRUL PEPTIDE ANTIBODY, IGG: Cyclic Citrullin Peptide Ab: <16 UNITS

## 2019-11-10 LAB — IFE INTERPRETATION: Immunofix Electr Int: DETECTED

## 2019-11-10 LAB — SEDIMENTATION RATE: Sed Rate: 22 mm/h — ABNORMAL HIGH (ref 0–20)

## 2019-11-10 LAB — HEPATITIS B SURFACE ANTIGEN: Hepatitis B Surface Ag: NONREACTIVE

## 2019-11-10 LAB — RHEUMATOID FACTOR: Rheumatoid fact SerPl-aCnc: <14 IU/mL (ref <14)

## 2019-11-10 NOTE — Telephone Encounter (Signed)
Jasmine Peck from [facility] returned call to let you know there is no interaction between [place].

## 2019-11-15 ENCOUNTER — Encounter: Payer: Self-pay | Admitting: Family

## 2019-11-15 ENCOUNTER — Other Ambulatory Visit: Payer: Self-pay

## 2019-11-15 ENCOUNTER — Ambulatory Visit (INDEPENDENT_AMBULATORY_CARE_PROVIDER_SITE_OTHER): Payer: Medicaid Other | Admitting: Family

## 2019-11-15 VITALS — BP 119/75 | HR 97 | Temp 97.9°F | Ht 67.0 in | Wt 269.0 lb

## 2019-11-15 DIAGNOSIS — I152 Hypertension secondary to endocrine disorders: Secondary | ICD-10-CM

## 2019-11-15 DIAGNOSIS — F331 Major depressive disorder, recurrent, moderate: Secondary | ICD-10-CM

## 2019-11-15 DIAGNOSIS — E1142 Type 2 diabetes mellitus with diabetic polyneuropathy: Secondary | ICD-10-CM

## 2019-11-15 DIAGNOSIS — E1159 Type 2 diabetes mellitus with other circulatory complications: Secondary | ICD-10-CM

## 2019-11-15 DIAGNOSIS — E559 Vitamin D deficiency, unspecified: Secondary | ICD-10-CM

## 2019-11-15 DIAGNOSIS — F411 Generalized anxiety disorder: Secondary | ICD-10-CM

## 2019-11-15 DIAGNOSIS — L409 Psoriasis, unspecified: Secondary | ICD-10-CM

## 2019-11-15 DIAGNOSIS — R634 Abnormal weight loss: Secondary | ICD-10-CM

## 2019-11-15 DIAGNOSIS — Z6841 Body Mass Index (BMI) 40.0 and over, adult: Secondary | ICD-10-CM

## 2019-11-15 DIAGNOSIS — Z716 Tobacco abuse counseling: Secondary | ICD-10-CM

## 2019-11-15 DIAGNOSIS — K219 Gastro-esophageal reflux disease without esophagitis: Secondary | ICD-10-CM

## 2019-11-15 DIAGNOSIS — F17200 Nicotine dependence, unspecified, uncomplicated: Secondary | ICD-10-CM

## 2019-11-15 DIAGNOSIS — E1169 Type 2 diabetes mellitus with other specified complication: Secondary | ICD-10-CM

## 2019-11-15 DIAGNOSIS — B20 Human immunodeficiency virus [HIV] disease: Secondary | ICD-10-CM

## 2019-11-15 DIAGNOSIS — Z1211 Encounter for screening for malignant neoplasm of colon: Secondary | ICD-10-CM

## 2019-11-15 DIAGNOSIS — L4050 Arthropathic psoriasis, unspecified: Secondary | ICD-10-CM

## 2019-11-15 DIAGNOSIS — E785 Hyperlipidemia, unspecified: Secondary | ICD-10-CM

## 2019-11-15 LAB — BAYER DCA HB A1C WAIVED: HB A1C (BAYER DCA - WAIVED): 6.1 % (ref <7.0)

## 2019-11-15 MED ORDER — METFORMIN HCL 500 MG PO TABS: 500.0000 mg | ORAL_TABLET | Freq: Two times a day (BID) | ORAL | 2 refills | Status: DC

## 2019-11-15 MED ORDER — LISINOPRIL 30 MG PO TABS: 30.0000 mg | ORAL_TABLET | Freq: Every day | ORAL | 2 refills | Status: DC

## 2019-11-15 MED ORDER — AMLODIPINE BESYLATE 10 MG PO TABS: 10.0000 mg | ORAL_TABLET | Freq: Every day | ORAL | 3 refills | Status: DC

## 2019-11-15 MED ORDER — NICOTINE 21 MG/24HR TD PT24: 21.0000 mg | MEDICATED_PATCH | Freq: Every day | TRANSDERMAL | 0 refills | Status: DC

## 2019-11-15 MED ORDER — GABAPENTIN 600 MG PO TABS: ORAL_TABLET | ORAL | 1 refills | Status: DC

## 2019-11-15 MED ORDER — OMEPRAZOLE 20 MG PO CPDR: DELAYED_RELEASE_CAPSULE | ORAL | 4 refills | Status: DC

## 2019-11-15 NOTE — Progress Notes (Addendum)
Subjective:    Patient ID: Jasmine Peck, female    DOB: 03-Aug-1975, 44 y.o.   MRN: [id number]  Chief Complaint  Patient presents with  . Hypertension    has not had any medications  . Diabetes   Pt presents to the office today for chronic follow up. Pt is HIV positive andis followed by Infectious Diseaseevery 6 months. She is followed by Dermatologist for psoriasis. She is followed by Rheumatologist for psoriatic arthritis and was prescribed Otezla. She is waiting for her insurance to approve this.   She is reports she started smoking when she was 44 years old and been smoking for 30 years.   She is under a great deal of stress. She is caring for her autistic grandson who is very aggressive. He has a court date in a few weeks and should be going to a group home.  Hypertension This is a chronic problem. The current episode started more than 1 year ago. The problem has been resolved since onset. Associated symptoms include anxiety and malaise/fatigue. Pertinent negatives include no blurred vision or shortness of breath. Risk factors for coronary artery disease include dyslipidemia, obesity and sedentary lifestyle. The current treatment provides moderate improvement. There is no history of CVA or heart failure.  Diabetes She presents for her follow-up diabetic visit. She has type 2 diabetes mellitus. Her disease course has been improving. Hypoglycemia symptoms include nervousness/anxiousness. Associated symptoms include foot paresthesias. Pertinent negatives for diabetes include no blurred vision. There are no hypoglycemic complications. Symptoms are stable. Pertinent negatives for diabetic complications include no CVA. Risk factors for coronary artery disease include dyslipidemia, diabetes mellitus, hypertension, sedentary lifestyle, post-menopausal and tobacco exposure. She is following a generally healthy diet. Her overall blood glucose range is 110-130 mg/dl. An ACE inhibitor/angiotensin  II receptor blocker is being taken.  Gastroesophageal Reflux She complains of belching and heartburn. This is a chronic problem. The current episode started more than 1 year ago. The problem occurs occasionally. The problem has been waxing and waning. Risk factors include obesity. She has tried a PPI for the symptoms. The treatment provided moderate relief.  Anxiety Presents for follow-up visit. Symptoms include depressed mood, excessive worry, irritability, nervous/anxious behavior, panic and restlessness. Patient reports no shortness of breath. The severity of symptoms is moderate. The quality of sleep is good.    Depression        This is a chronic problem.  The current episode started more than 1 year ago.   The onset quality is gradual.   The problem occurs intermittently.  The problem has been waxing and waning since onset.  Associated symptoms include irritable and restlessness.  Associated symptoms include no helplessness and no hopelessness.  Past medical history includes anxiety.   Nicotine Dependence Presents for follow-up visit. Symptoms include irritability. Her urge triggers include company of smokers. The symptoms have been stable. She smokes 1 pack of cigarettes per day. Compliance with prior treatments has been good.  Hyperlipidemia This is a chronic problem. The current episode started more than 1 year ago. Exacerbating diseases include obesity. Pertinent negatives include no shortness of breath. The current treatment provides moderate improvement of lipids. Risk factors for coronary artery disease include dyslipidemia, diabetes mellitus, hypertension, a sedentary lifestyle and post-menopausal.      Review of Systems  Constitutional: Positive for irritability and malaise/fatigue.  Eyes: Negative for blurred vision.  Respiratory: Negative for shortness of breath.   Gastrointestinal: Positive for heartburn.  Psychiatric/Behavioral: Positive for depression. The  patient is  nervous/anxious.   All other systems reviewed and are negative.  Family History  Problem Relation Age of Onset  . Heart disease Mother   . Diabetes Mother   . Hypertension Mother   . Early death Father   . Diabetes Father   . Hypertension Father   . Diabetes Brother   . Hypertension Brother   . Hyperlipidemia Brother   . Heart disease Maternal Grandmother   . Diabetes Maternal Grandmother   . Diabetes Paternal Grandfather   . Heart disease Paternal Grandfather   . GER disease Daughter   . Healthy Daughter   . Healthy Daughter   . Healthy Daughter   . Asthma Daughter    Social History   Socioeconomic History  . Marital status: Divorced    Spouse name: Not on file  . Number of children: Not on file  . Years of education: Not on file  . Highest education level: Not on file  Occupational History  . Not on file  Tobacco Use  . Smoking status: Current Every Day Smoker    Packs/day: 0.50  . Smokeless tobacco: Never Used  Vaping Use  . Vaping Use: Never used  Substance and Sexual Activity  . Alcohol use: No    Alcohol/week: 0.0 standard drinks  . Drug use: No  . Sexual activity: Never  Other Topics Concern  . Not on file  Social History Narrative  . Not on file   Social Determinants of Health   Financial Resource Strain:   . Difficulty of Paying Living Expenses: Not on file  Food Insecurity:   . Worried About Charity fundraiser in the Last Year: Not on file  . Ran Out of Food in the Last Year: Not on file  Transportation Needs:   . Lack of Transportation (Medical): Not on file  . Lack of Transportation (Non-Medical): Not on file  Physical Activity:   . Days of Exercise per Week: Not on file  . Minutes of Exercise per Session: Not on file  Stress:   . Feeling of Stress : Not on file  Social Connections:   . Frequency of Communication with Friends and Family: Not on file  . Frequency of Social Gatherings with Friends and Family: Not on file  . Attends  Religious Services: Not on file  . Active Member of Clubs or Organizations: Not on file  . Attends Archivist Meetings: Not on file  . Marital Status: Not on file       Objective:   Physical Exam Vitals reviewed.  Constitutional:      General: She is irritable. She is not in acute distress.    Appearance: She is well-developed. She is obese.  HENT:     Head: Normocephalic and atraumatic.     Right Ear: Tympanic membrane normal.     Left Ear: Tympanic membrane normal.  Eyes:     Pupils: Pupils are equal, round, and reactive to light.  Neck:     Thyroid: No thyromegaly.  Cardiovascular:     Rate and Rhythm: Normal rate and regular rhythm.     Heart sounds: Normal heart sounds. No murmur heard.   Pulmonary:     Effort: Pulmonary effort is normal. No respiratory distress.     Breath sounds: Normal breath sounds. No wheezing.  Abdominal:     General: Bowel sounds are normal. There is no distension.     Palpations: Abdomen is soft.     Tenderness:  There is no abdominal tenderness.  Musculoskeletal:        General: No tenderness. Normal range of motion.     Cervical back: Normal range of motion and neck supple.  Skin:    General: Skin is warm and dry.  Neurological:     Mental Status: She is alert and oriented to person, place, and time.     Cranial Nerves: No cranial nerve deficit.     Deep Tendon Reflexes: Reflexes are normal and symmetric.  Psychiatric:        Mood and Affect: Affect is tearful.        Behavior: Behavior normal.        Thought Content: Thought content normal.        Judgment: Judgment normal.     Comments: Pt crying       BP 119/75   Pulse 97   Temp 97.9 F (36.6 C) (Temporal)   Ht 5' 7"  (1.702 m)   Wt 269 lb (122 kg)   SpO2 100%   BMI 42.13 kg/m      Assessment & Plan:  Lauraine Crespo comes in today with chief complaint of Hypertension (has not had any medications) and Diabetes   Diagnosis and orders addressed:  1. Type 2  diabetes mellitus with diabetic polyneuropathy, without long-term current use of insulin (HCC) - Bayer DCA Hb A1c Waived - metFORMIN (GLUCOPHAGE) 500 MG tablet; Take 1 tablet (500 mg total) by mouth 2 (two) times daily with a meal. TAKE TWO TABLETS BY MOUTH 2 TIMES A DAY WITH MEALS  Dispense: 180 tablet; Refill: 2 - CMP14+EGFR - Vitamin B12  2. Diabetic peripheral neuropathy (HCC) - gabapentin (NEURONTIN) 600 MG tablet; TAKE ONE TABLET BY MOUTH THREE TIMES DAILY  Dispense: 90 tablet; Refill: 1 - CMP14+EGFR - Vitamin B12  3. Gastroesophageal reflux disease, unspecified whether esophagitis present - omeprazole (PRILOSEC) 20 MG capsule; TAKE ONE CAPSULE BY MOUTH EVERY DAY, NEEDS APPOINTMENT FOR REFILLS  Dispense: 90 capsule; Refill: 4 - CMP14+EGFR - Vitamin B12  4. Hypertension associated with diabetes ([place]) - amLODipine (NORVASC) 10 MG tablet; Take 1 tablet (10 mg total) by mouth daily.  Dispense: 90 tablet; Refill: 3 - lisinopril (ZESTRIL) 30 MG tablet; Take 1 tablet (30 mg total) by mouth daily.  Dispense: 90 tablet; Refill: 2 - CMP14+EGFR - Vitamin B12  5. Hyperlipidemia associated with type 2 diabetes mellitus (HCC) - CMP14+EGFR - Vitamin B12  6. Current smoker - CMP14+EGFR - Vitamin B12 - nicotine (NICODERM CQ - DOSED IN MG/24 HOURS) 21 mg/24hr patch; Place 1 patch (21 mg total) onto the skin daily.  Dispense: 28 patch; Refill: 0  7. Moderate episode of recurrent major depressive disorder (HCC) - CMP14+EGFR - Vitamin B12  8. Vitamin D deficiency - CMP14+EGFR - VITAMIN D 25 Hydroxy (Vit-D Deficiency, Fractures) - Vitamin B12  9. Morbid obesity with BMI of 45.0-49.9, adult (HCC) - CMP14+EGFR - Vitamin B12  10. Human immunodeficiency virus (HIV) disease ([place]) - CMP14+EGFR - Vitamin B12  11. GAD (generalized anxiety disorder) - CMP14+EGFR - Vitamin B12  12. Colon cancer screening - Cologuard - CMP14+EGFR - Vitamin B12  13. Weight loss - TSH - Vitamin  B12  14. Encounter for smoking cessation counseling 3 mins of counseling on quitting smoking. PT will start using nicotine patches and gradually decrease cigarettes  - nicotine (NICODERM CQ - DOSED IN MG/24 HOURS) 21 mg/24hr patch; Place 1 patch (21 mg total) onto the skin daily.  Dispense: 28 patch; Refill:  0  15. Psoriasis  16. Psoriatic arthritis ([place]) Keep specialists appt, read through their noted and discussed there was no interaction between the [place] and [employer] pending Health Maintenance reviewed Diet and exercise encouraged  Follow up plan: 3 months   Spent greater than 35 mins with patient counseling and educating patient.   [name], FNP

## 2019-11-15 NOTE — Patient Instructions (Signed)
Coping with Quitting Smoking  Quitting smoking is a physical and mental challenge. You will face cravings, withdrawal symptoms, and temptation. Before quitting, work with your health care provider to make a plan that can help you cope. Preparation can help you quit and keep you from giving in. How can I cope with cravings? Cravings usually last for 5-10 minutes. If you get through it, the craving will pass. Consider taking the following actions to help you cope with cravings:  Keep your mouth busy: ? Chew sugar-free gum. ? Suck on hard candies or a straw. ? Brush your teeth.  Keep your hands and body busy: ? Immediately change to a different activity when you feel a craving. ? Squeeze or play with a ball. ? Do an activity or a hobby, like making bead jewelry, practicing needlepoint, or working with wood. ? Mix up your normal routine. ? Take a short exercise break. Go for a quick walk or run up and down stairs. ? Spend time in public places where smoking is not allowed.  Focus on doing something kind or helpful for someone else.  Call a friend or family member to talk during a craving.  Join a support group.  Call a quit line, such as 1-800-QUIT-NOW.  Talk with your health care provider about medicines that might help you cope with cravings and make quitting easier for you. How can I deal with withdrawal symptoms? Your body may experience negative effects as it tries to get used to not having nicotine in the system. These effects are called withdrawal symptoms. They may include:  Feeling hungrier than normal.  Trouble concentrating.  Irritability.  Trouble sleeping.  Feeling depressed.  Restlessness and agitation.  Craving a cigarette. To manage withdrawal symptoms:  Avoid places, people, and activities that trigger your cravings.  Remember why you want to quit.  Get plenty of sleep.  Avoid coffee and other caffeinated drinks. These may worsen some of your  symptoms. How can I handle social situations? Social situations can be difficult when you are quitting smoking, especially in the first few weeks. To manage this, you can:  Avoid parties, bars, and other social situations where people might be smoking.  Avoid alcohol.  Leave right away if you have the urge to smoke.  Explain to your family and friends that you are quitting smoking. Ask for understanding and support.  Plan activities with friends or family where smoking is not an option. What are some ways I can cope with stress? Wanting to smoke may cause stress, and stress can make you want to smoke. Find ways to manage your stress. Relaxation techniques can help. For example:  Breathe slowly and deeply, in through your nose and out through your mouth.  Listen to soothing, relaxing music.  Talk with a family member or friend about your stress.  Light a candle.  Soak in a bath or take a shower.  Think about a peaceful place. What are some ways I can prevent weight gain? Be aware that many people gain weight after they quit smoking. However, not everyone does. To keep from gaining weight, have a plan in place before you quit and stick to the plan after you quit. Your plan should include:  Having healthy snacks. When you have a craving, it may help to: ? Eat plain popcorn, crunchy carrots, celery, or other cut vegetables. ? Chew sugar-free gum.  Changing how you eat: ? Eat small portion sizes at meals. ? Eat 4-6 small meals   throughout the day instead of 1-2 large meals a day. ? Be mindful when you eat. Do not watch television or do other things that might distract you as you eat.  Exercising regularly: ? Make time to exercise each day. If you do not have time for a long workout, do short bouts of exercise for 5-10 minutes several times a day. ? Do some form of strengthening exercise, like weight lifting, and some form of aerobic exercise, like running or swimming.  Drinking  plenty of water or other low-calorie or no-calorie drinks. Drink 6-8 glasses of water daily, or as much as instructed by your health care provider. Summary  Quitting smoking is a physical and mental challenge. You will face cravings, withdrawal symptoms, and temptation to smoke again. Preparation can help you as you go through these challenges.  You can cope with cravings by keeping your mouth busy (such as by chewing gum), keeping your body and hands busy, and making calls to family, friends, or a helpline for people who want to quit smoking.  You can cope with withdrawal symptoms by avoiding places where people smoke, avoiding drinks with caffeine, and getting plenty of rest.  Ask your health care provider about the different ways to prevent weight gain, avoid stress, and handle social situations. This information is not intended to replace advice given to you by your health care provider. Make sure you discuss any questions you have with your health care provider. Document Revised: 12/12/2016 Document Reviewed: 12/28/2015 Elsevier Patient Education  2020 Elsevier Inc.  

## 2019-11-16 LAB — CMP14+EGFR
ALT: 10 IU/L (ref 0–32)
AST: 9 IU/L (ref 0–40)
Albumin/Globulin Ratio: 1.8 (ref 1.2–2.2)
Albumin: 4.6 g/dL (ref 3.8–4.8)
Alkaline Phosphatase: 82 IU/L (ref 44–121)
BUN/Creatinine Ratio: 10 (ref 9–23)
BUN: 8 mg/dL (ref 6–24)
Bilirubin Total: 0.4 mg/dL (ref 0.0–1.2)
CO2: 25 mmol/L (ref 20–29)
Calcium: 9.8 mg/dL (ref 8.7–10.2)
Chloride: 102 mmol/L (ref 96–106)
Creatinine, Ser: 0.81 mg/dL (ref 0.57–1.00)
GFR calc Af Amer: 102 mL/min/{1.73_m2} (ref >59)
GFR calc non Af Amer: 89 mL/min/{1.73_m2} (ref >59)
Globulin, Total: 2.6 g/dL (ref 1.5–4.5)
Glucose: 111 mg/dL — ABNORMAL HIGH (ref 65–99)
Potassium: 4.1 mmol/L (ref 3.5–5.2)
Sodium: 142 mmol/L (ref 134–144)
Total Protein: 7.2 g/dL (ref 6.0–8.5)

## 2019-11-16 LAB — TSH: TSH: 1.37 u[IU]/mL (ref 0.450–4.500)

## 2019-11-16 LAB — VITAMIN B12: Vitamin B-12: 310 pg/mL (ref 232–1245)

## 2019-11-16 LAB — VITAMIN D 25 HYDROXY (VIT D DEFICIENCY, FRACTURES): Vit D, 25-Hydroxy: 40.1 ng/mL (ref 30.0–100.0)

## 2019-11-17 ENCOUNTER — Telehealth: Payer: Self-pay | Admitting: Rheumatology

## 2019-11-17 DIAGNOSIS — R778 Other specified abnormalities of plasma proteins: Secondary | ICD-10-CM

## 2019-11-17 NOTE — Progress Notes (Deleted)
Office Visit Note  Patient: Jasmine Peck             Date of Birth: 1975-03-28           MRN: [id number]             PCP: Sharion Balloon, FNP Referring: Sharion Balloon, FNP Visit Date: 11/29/2019 Occupation: @GUAROCC @  Subjective:  No chief complaint on file.   History of Present Illness: Jasmine Peck is a 44 y.o. female ***   Activities of Daily Living:  Patient reports morning stiffness for *** {minute/hour:19697}.   Patient {ACTIONS;DENIES/REPORTS:21021675::"Denies"} nocturnal pain.  Difficulty dressing/grooming: {ACTIONS;DENIES/REPORTS:21021675::"Denies"} Difficulty climbing stairs: {ACTIONS;DENIES/REPORTS:21021675::"Denies"} Difficulty getting out of chair: {ACTIONS;DENIES/REPORTS:21021675::"Denies"} Difficulty using hands for taps, buttons, cutlery, and/or writing: {ACTIONS;DENIES/REPORTS:21021675::"Denies"}  No Rheumatology ROS completed.   PMFS History:  Patient Active Problem List   Diagnosis Date Noted  . Psoriatic arthritis ([place]) 11/15/2019  . Psoriasis 11/15/2019  . Hyperlipidemia associated with type 2 diabetes mellitus ([place]) 03/19/2017  . Diabetic peripheral neuropathy ([place]) 06/18/2016  . Current smoker 06/18/2016  . Morbid obesity with BMI of 45.0-49.9, adult ([place]) 10/24/2014  . Vitamin D deficiency 04/05/2014  . Hypertension associated with diabetes ([place]) 04/04/2014  . GERD (gastroesophageal reflux disease) 04/04/2014  . Diabetes mellitus, type 2 ([place]) 04/04/2014  . GAD (generalized anxiety disorder) 04/04/2014  . Depression 04/04/2014  . Human immunodeficiency virus (HIV) disease ([place]) 04/04/2014    Past Medical History:  Diagnosis Date  . Anxiety   . Depression   . Diabetes mellitus without complication ([place])   . GERD (gastroesophageal reflux disease)   . HIV infection ([place])   . Hypertension   . Psoriasis    per patient     Family History  Problem Relation Age of Onset  . Heart disease Mother   . Diabetes Mother   .  Hypertension Mother   . Early death Father   . Diabetes Father   . Hypertension Father   . Diabetes Brother   . Hypertension Brother   . Hyperlipidemia Brother   . Heart disease Maternal Grandmother   . Diabetes Maternal Grandmother   . Diabetes Paternal Grandfather   . Heart disease Paternal Grandfather   . GER disease Daughter   . Healthy Daughter   . Healthy Daughter   . Healthy Daughter   . Asthma Daughter    Past Surgical History:  Procedure Laterality Date  . CHOLECYSTECTOMY    . DILATION AND CURETTAGE OF UTERUS    . MOUTH SURGERY     all teeth have been removed    Social History   Social History Narrative  . Not on file   Immunization History  Administered Date(s) Administered  . Hepatitis A 01/15/2017, 01/14/2018  . Hepatitis A, Adult 08/03/2014, 11/02/2014, 01/15/2017, 01/14/2018  . Hepatitis B 01/15/2017, 01/14/2018  . Hepatitis B, Dialysis 08/03/2014, 11/02/2014, 04/19/2015  . Influenza, Quadrivalent, Recombinant, Inj, Pf 10/05/2018  . Influenza,inj,Quad PF,6+ Mos 11/03/2013, 11/02/2014, 11/20/2015, 12/07/2017  . Influenza-Unspecified 11/22/2012, 03/17/2013, 10/31/2014, 11/20/2015, 11/03/2019  . MMR 05/16/2013  . Pneumococcal Conjugate-13 06/01/2013, 01/14/2018  . Pneumococcal Polysaccharide-23 05/04/2014  . Tdap 03/17/2013     Objective: Vital Signs: There were no vitals taken for this visit.   Physical Exam   Musculoskeletal Exam: ***  CDAI Exam: CDAI Score: -- Patient Global: --; Provider Global: -- Swollen: --; Tender: -- Joint Exam 11/29/2019   No joint exam has been documented for this visit   There is currently no information documented on the homunculus.  Go to the Rheumatology activity and complete the homunculus joint exam.  Investigation: No additional findings.  Imaging: XR Hand 2 View Right  Result Date: 11/07/2019 No PIP or DIP narrowing was noted.  No MCP, intercarpal radiocarpal joint space narrowing was noted. Impression:  Unremarkable x-ray of the hand.  XR KNEE 3 VIEW RIGHT  Result Date: 11/07/2019 Mild medial compartment narrowing with intercondylar osteophytes was noted.  Moderate patellofemoral narrowing was noted. 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   Recent Labs: Lab Results  Component Value Date   WBC 9.2 11/07/2019   HGB 14.4 11/07/2019   PLT 374 11/07/2019   NA 142 11/15/2019   K 4.1 11/15/2019   CL 102 11/15/2019   CO2 25 11/15/2019   GLUCOSE 111 (H) 11/15/2019   BUN 8 11/15/2019   CREATININE 0.81 11/15/2019   BILITOT 0.4 11/15/2019   ALKPHOS 82 11/15/2019   AST 9 11/15/2019   ALT 10 11/15/2019   PROT 7.2 11/15/2019   ALBUMIN 4.6 11/15/2019   CALCIUM 9.8 11/15/2019   GFRAA 102 11/15/2019   QFTBGOLDPLUS NEGATIVE 11/07/2019   November 07, 2019 IFE showed IgA kappa monoclonal protein, TB Gold negative, IgA 319 mildly elevated, hepatitis B-, hepatitis C negative, ESR 22, RF negative, anti-CCP negative  Speciality Comments: No specialty comments available.  Procedures:  No procedures performed Allergies: Clindamycin/lincomycin and Erythromycin   Assessment / Plan:     Visit Diagnoses: No diagnosis found.  Orders: No orders of the defined types were placed in this encounter.  No orders of the defined types were placed in this encounter.   Face-to-face time spent with patient was *** minutes. Greater than 50% of time was spent in counseling and coordination of care.  Follow-Up Instructions: No follow-ups on file.   Bo Merino, MD  Note - This record has been created using Editor, commissioning.  Chart creation errors have been sought, but may not always  have been located. Such creation errors do not reflect on  the standard of medical care.

## 2019-11-17 NOTE — Addendum Note (Signed)
Addended by: [name] A on: 11/17/2019 08:13 AM   Modules accepted: Level of Service

## 2019-11-17 NOTE — Telephone Encounter (Signed)
Patient advised of results and referral placed to hematology.

## 2019-11-17 NOTE — Telephone Encounter (Signed)
Abnormal IFE noted.  Please refer patient to hematology for evaluation. Bo Merino, MD

## 2019-11-17 NOTE — Addendum Note (Signed)
Addended by: Carole Binning on: 11/17/2019 02:15 PM   Modules accepted: Orders

## 2019-11-25 NOTE — Telephone Encounter (Signed)
Reached out to [facility] to let them know we need a clearance letter from the provider so we may start patient on [place]. Almyra Free states she will send message to the provider and return call to the office.

## 2019-11-25 NOTE — Telephone Encounter (Signed)
We have received clearance letter to start patient on [place]. Do you want to send start pack to the pharmacy or have patient come to the office for start pack and then send maintenance dose to the pharmacy.

## 2019-11-25 NOTE — Telephone Encounter (Signed)
Reached out to [facility] to let them know we need a clearance letter from the provider so we may start patient on [place]. Almyra Free states she will send message to the provider and return call to the office.

## 2019-11-25 NOTE — Telephone Encounter (Signed)
Okay to give her the starter pack for [place].

## 2019-11-25 NOTE — Telephone Encounter (Signed)
Noted patient's appointment to provide starter pack sample. Pended prescription for maintenance dose to be sent to the pharmacy.

## 2019-11-29 ENCOUNTER — Ambulatory Visit: Payer: Medicaid Other | Admitting: Rheumatology

## 2019-11-29 DIAGNOSIS — L409 Psoriasis, unspecified: Secondary | ICD-10-CM

## 2019-11-29 DIAGNOSIS — B20 Human immunodeficiency virus [HIV] disease: Secondary | ICD-10-CM

## 2019-11-29 DIAGNOSIS — F17200 Nicotine dependence, unspecified, uncomplicated: Secondary | ICD-10-CM

## 2019-11-29 DIAGNOSIS — E1142 Type 2 diabetes mellitus with diabetic polyneuropathy: Secondary | ICD-10-CM

## 2019-11-29 DIAGNOSIS — L4050 Arthropathic psoriasis, unspecified: Secondary | ICD-10-CM

## 2019-11-29 DIAGNOSIS — E1169 Type 2 diabetes mellitus with other specified complication: Secondary | ICD-10-CM

## 2019-11-29 DIAGNOSIS — Z79899 Other long term (current) drug therapy: Secondary | ICD-10-CM

## 2019-11-29 DIAGNOSIS — Z8719 Personal history of other diseases of the digestive system: Secondary | ICD-10-CM

## 2019-11-29 DIAGNOSIS — Z840 Family history of diseases of the skin and subcutaneous tissue: Secondary | ICD-10-CM

## 2019-11-29 DIAGNOSIS — G2581 Restless legs syndrome: Secondary | ICD-10-CM

## 2019-11-29 DIAGNOSIS — F411 Generalized anxiety disorder: Secondary | ICD-10-CM

## 2019-11-29 DIAGNOSIS — R778 Other specified abnormalities of plasma proteins: Secondary | ICD-10-CM

## 2019-11-29 DIAGNOSIS — E559 Vitamin D deficiency, unspecified: Secondary | ICD-10-CM

## 2019-11-29 DIAGNOSIS — G4733 Obstructive sleep apnea (adult) (pediatric): Secondary | ICD-10-CM

## 2019-11-29 DIAGNOSIS — I152 Hypertension secondary to endocrine disorders: Secondary | ICD-10-CM

## 2019-11-30 ENCOUNTER — Other Ambulatory Visit: Payer: Self-pay

## 2019-11-30 ENCOUNTER — Ambulatory Visit
Admission: RE | Admit: 2019-11-30 | Discharge: 2019-11-30 | Disposition: A | Discharge status: Home / Self Care | Payer: Medicaid Other | Source: Ambulatory Visit | Attending: Family | Admitting: Family

## 2019-11-30 DIAGNOSIS — Z1231 Encounter for screening mammogram for malignant neoplasm of breast: Secondary | ICD-10-CM | POA: Diagnosis not present

## 2019-12-12 ENCOUNTER — Inpatient Hospital Stay (HOSPITAL_COMMUNITY): Payer: Medicaid Other

## 2019-12-12 ENCOUNTER — Inpatient Hospital Stay (HOSPITAL_COMMUNITY): Payer: Medicaid Other | Attending: Hematology | Admitting: Hematology

## 2019-12-12 ENCOUNTER — Other Ambulatory Visit: Payer: Self-pay

## 2019-12-12 VITALS — BP 117/92 | HR 97 | Temp 97.3°F | Resp 18 | Ht 67.0 in | Wt 272.0 lb

## 2019-12-12 DIAGNOSIS — Z808 Family history of malignant neoplasm of other organs or systems: Secondary | ICD-10-CM | POA: Diagnosis not present

## 2019-12-12 DIAGNOSIS — E119 Type 2 diabetes mellitus without complications: Secondary | ICD-10-CM

## 2019-12-12 DIAGNOSIS — F17200 Nicotine dependence, unspecified, uncomplicated: Secondary | ICD-10-CM

## 2019-12-12 DIAGNOSIS — F329 Major depressive disorder, single episode, unspecified: Secondary | ICD-10-CM

## 2019-12-12 DIAGNOSIS — I10 Essential (primary) hypertension: Secondary | ICD-10-CM

## 2019-12-12 DIAGNOSIS — E559 Vitamin D deficiency, unspecified: Secondary | ICD-10-CM

## 2019-12-12 DIAGNOSIS — Z7984 Long term (current) use of oral hypoglycemic drugs: Secondary | ICD-10-CM | POA: Diagnosis not present

## 2019-12-12 DIAGNOSIS — L4050 Arthropathic psoriasis, unspecified: Secondary | ICD-10-CM | POA: Diagnosis not present

## 2019-12-12 DIAGNOSIS — D472 Monoclonal gammopathy: Secondary | ICD-10-CM

## 2019-12-12 DIAGNOSIS — Z21 Asymptomatic human immunodeficiency virus [HIV] infection status: Secondary | ICD-10-CM | POA: Diagnosis not present

## 2019-12-12 DIAGNOSIS — Z6841 Body Mass Index (BMI) 40.0 and over, adult: Secondary | ICD-10-CM

## 2019-12-12 LAB — CBC WITH DIFFERENTIAL/PLATELET
Abs Immature Granulocytes: 0.02 10*3/uL (ref 0.00–0.07)
Basophils Absolute: 0.1 10*3/uL (ref 0.0–0.1)
Basophils Relative: 1 %
Eosinophils Absolute: 0.1 10*3/uL (ref 0.0–0.5)
Eosinophils Relative: 1 %
HCT: 43.3 % (ref 36.0–46.0)
Hemoglobin: 14 g/dL (ref 12.0–15.0)
Immature Granulocytes: 0 %
Lymphocytes Relative: 31 %
Lymphs Abs: 3.1 10*3/uL (ref 0.7–4.0)
MCH: 32.4 pg (ref 26.0–34.0)
MCHC: 32.3 g/dL (ref 30.0–36.0)
MCV: 100.2 fL — ABNORMAL HIGH (ref 80.0–100.0)
Monocytes Absolute: 0.7 10*3/uL (ref 0.1–1.0)
Monocytes Relative: 7 %
Neutro Abs: 5.8 10*3/uL (ref 1.7–7.7)
Neutrophils Relative %: 60 %
Platelets: 371 10*3/uL (ref 150–400)
RBC: 4.32 MIL/uL (ref 3.87–5.11)
RDW: 13.2 % (ref 11.5–15.5)
WBC: 9.7 10*3/uL (ref 4.0–10.5)
nRBC: 0 % (ref 0.0–0.2)

## 2019-12-12 LAB — COMPREHENSIVE METABOLIC PANEL
ALT: 10 U/L (ref 0–44)
AST: 13 U/L — ABNORMAL LOW (ref 15–41)
Albumin: 4 g/dL (ref 3.5–5.0)
Alkaline Phosphatase: 62 U/L (ref 38–126)
Anion gap: 10 (ref 5–15)
BUN: 14 mg/dL (ref 6–20)
CO2: 23 mmol/L (ref 22–32)
Calcium: 8.9 mg/dL (ref 8.9–10.3)
Chloride: 104 mmol/L (ref 98–111)
Creatinine, Ser: 0.69 mg/dL (ref 0.44–1.00)
GFR, Estimated: >60 mL/min (ref >60)
Glucose, Bld: 99 mg/dL (ref 70–99)
Potassium: 4 mmol/L (ref 3.5–5.1)
Sodium: 137 mmol/L (ref 135–145)
Total Bilirubin: 0.3 mg/dL (ref 0.3–1.2)
Total Protein: 7.4 g/dL (ref 6.5–8.1)

## 2019-12-12 LAB — LACTATE DEHYDROGENASE: LDH: 98 U/L (ref 98–192)

## 2019-12-12 NOTE — Progress Notes (Signed)
CONSULT NOTE  Patient Care Team: Sharion Balloon, FNP as PCP - General (Nurse Practitioner)  CHIEF COMPLAINTS/PURPOSE OF CONSULTATION: abnormal SPEP  HISTORY OF PRESENTING ILLNESS:  Jasmine Peck 44 y.o. female is here because of an abnormal SPEP.  M spike elevated at 0.4.  Jasmine Peck is a 44 year old female with past medical history significant for hypertension, type 2 diabetes, hyperlipidemia, psoriatic arthritis, depression, vitamin D deficiency, morbid obesity, tobacco abuse and HIV.  She is followed closely by infectious disease and currently her viral load is undetectable.  She is followed by dermatology for psoriasis and has been treated with topical agents and methotrexate which caused a severe anaphylactic reaction.  Incidentally noted to have an elevated sedimentation rate.  She was referred to rheumatology for further work-up and evaluation of psoriasis and arthralgias in her right knee and RCP joint.  She was started on Otezla for her psoriasis.  Prior to initiating medication, several labs were checked revealing abnormality in her SPEP.  Today she reports feeling well.  Her joints have improved.  She has not started [place].  Her psoriasis is stable.  She is a current everyday smoker approximately 1 pack/day for the last 30 years.  She is obese with a BMI greater than 45.  She denies any family history of blood disorders.  Admits to a distant relative who had esophageal cancer but otherwise most of her family has been healthy.  She lives at home with her mom who is her support.  She has a 108-year-old daughter and a 3 year old grandson who is autistic.   MEDICAL HISTORY:  Past Medical History:  Diagnosis Date   Anxiety    Depression    Diabetes mellitus without complication (HCC)    GERD (gastroesophageal reflux disease)    HIV infection ([place])    Hypertension    Psoriasis    per patient     SURGICAL HISTORY: Past Surgical History:  Procedure Laterality Date    CHOLECYSTECTOMY     DILATION AND CURETTAGE OF UTERUS     MOUTH SURGERY     all teeth have been removed     SOCIAL HISTORY: Social History   Socioeconomic History   Marital status: Divorced    Spouse name: Not on file   Number of children: Not on file   Years of education: Not on file   Highest education level: Not on file  Occupational History   Not on file  Tobacco Use   Smoking status: Current Every Day Smoker    Packs/day: 0.50   Smokeless tobacco: Never Used  Vaping Use   Vaping Use: Never used  Substance and Sexual Activity   Alcohol use: No    Alcohol/week: 0.0 standard drinks   Drug use: No   Sexual activity: Never  Other Topics Concern   Not on file  Social History Narrative   Not on file   Social Determinants of Health   Financial Resource Strain: Low Risk    Difficulty of Paying Living Expenses: Not hard at all  Food Insecurity: No Food Insecurity   Worried About Charity fundraiser in the Last Year: Never true   Ran Out of Food in the Last Year: Never true  Transportation Needs: No Transportation Needs   Lack of Transportation (Medical): No   Lack of Transportation (Non-Medical): No  Physical Activity: Inactive   Days of Exercise per Week: 0 days   Minutes of Exercise per Session: 0 min  Stress: Stress Concern Present  Feeling of Stress : Rather much  Social Connections: Moderately Isolated   Frequency of Communication with Friends and Family: More than three times a week   Frequency of Social Gatherings with Friends and Family: Three times a week   Attends Religious Services: Never   Active Member of Clubs or Organizations: Yes   Attends Archivist Meetings: Never   Marital Status: Never married  Human resources officer Violence: Not At Risk   Fear of Current or Ex-Partner: No   Emotionally Abused: No   Physically Abused: No   Sexually Abused: No    FAMILY HISTORY: Family History  Problem Relation Age  of Onset   Heart disease Mother    Diabetes Mother    Hypertension Mother    Early death Father    Diabetes Father    Hypertension Father    Diabetes Brother    Hypertension Brother    Hyperlipidemia Brother    Heart disease Maternal Grandmother    Diabetes Maternal Grandmother    Diabetes Paternal Grandfather    Heart disease Paternal Grandfather    GER disease Daughter    Healthy Daughter    Healthy Daughter    Healthy Daughter    Asthma Daughter     ALLERGIES:  is allergic to clindamycin/lincomycin and erythromycin.  MEDICATIONS:  Current Outpatient Medications  Medication Sig Dispense Refill   abacavir-dolutegravir-lamiVUDine (TRIUMEQ) 600-50-300 MG tablet TAKE 1 TABLET BY MOUTH DAILY.     amLODipine (NORVASC) 10 MG tablet Take 1 tablet (10 mg total) by mouth daily. 90 tablet 3   cetirizine (ZYRTEC) 10 MG tablet Take 1 tablet (10 mg total) by mouth daily. 90 tablet 4   furosemide (LASIX) 20 MG tablet Take 20 mg by mouth daily.     gabapentin (NEURONTIN) 600 MG tablet TAKE ONE TABLET BY MOUTH THREE TIMES DAILY 90 tablet 1   glucose blood (ACCU-CHEK AVIVA PLUS) test strip Check BS daily Dx E11.9 100 strip 3   lisinopril (ZESTRIL) 30 MG tablet Take 1 tablet (30 mg total) by mouth daily. 90 tablet 2   metFORMIN (GLUCOPHAGE) 500 MG tablet Take 1 tablet (500 mg total) by mouth 2 (two) times daily with a meal. TAKE TWO TABLETS BY MOUTH 2 TIMES A DAY WITH MEALS (Patient taking differently: Take 500 mg by mouth 2 (two) times daily with a meal. TAKE ONE TABLETS BY MOUTH 2 TIMES A DAY WITH MEALS) 180 tablet 2   nicotine (NICODERM CQ - DOSED IN MG/24 HOURS) 21 mg/24hr patch Place 1 patch (21 mg total) onto the skin daily. 28 patch 0   omeprazole (PRILOSEC) 20 MG capsule TAKE ONE CAPSULE BY MOUTH EVERY DAY, NEEDS APPOINTMENT FOR REFILLS 90 capsule 4   OVER THE COUNTER MEDICATION      sertraline (ZOLOFT) 50 MG tablet Take 50 mg by mouth daily.     No current  facility-administered medications for this visit.    REVIEW OF SYSTEMS:   Review of Systems  Constitutional: Negative.  Negative for chills, fever, malaise/fatigue and weight loss.  HENT: Negative for congestion, ear pain and tinnitus.   Eyes: Negative.  Negative for blurred vision and double vision.  Respiratory: Negative.  Negative for cough, sputum production and shortness of breath.   Cardiovascular: Negative.  Negative for chest pain, palpitations and leg swelling.  Gastrointestinal: Negative.  Negative for abdominal pain, constipation, diarrhea, nausea and vomiting.  Genitourinary: Negative for dysuria, frequency and urgency.  Musculoskeletal: Positive for joint pain and myalgias. Negative for  back pain and falls.  Skin: Positive for rash.  Neurological: Negative.  Negative for weakness and headaches.  Endo/Heme/Allergies: Negative.  Does not bruise/bleed easily.  Psychiatric/Behavioral: Negative for depression. The patient is nervous/anxious and has insomnia.      PHYSICAL EXAMINATION: ECOG PERFORMANCE STATUS: 1 - Symptomatic but completely ambulatory   Physical Exam Constitutional:      Appearance: Normal appearance.  HENT:     Head: Normocephalic and atraumatic.  Eyes:     Pupils: Pupils are equal, round, and reactive to light.  Cardiovascular:     Rate and Rhythm: Normal rate and regular rhythm.     Heart sounds: Normal heart sounds. No murmur heard.   Pulmonary:     Effort: Pulmonary effort is normal.     Breath sounds: Normal breath sounds. No wheezing.  Abdominal:     General: Bowel sounds are normal. There is no distension.     Palpations: Abdomen is soft.     Tenderness: There is no abdominal tenderness.  Musculoskeletal:        General: Normal range of motion.     Cervical back: Normal range of motion.  Skin:    General: Skin is warm and dry.     Findings: Rash present. Rash is crusting.  Neurological:     Mental Status: She is alert and oriented to  person, place, and time.  Psychiatric:        Judgment: Judgment normal.      Vitals:   12/12/19 1349  BP: (!) 117/92  Pulse: 97  Resp: 18  Temp: (!) 97.3 F (36.3 C)  SpO2: 100%   Filed Weights   12/12/19 1349  Weight: 272 lb (123.4 kg)      LABORATORY DATA:  I have reviewed the data as listed Recent Results (from the past 2160 hour(s))  CBC with Differential/Platelet     Status: Abnormal   Collection Time: 11/07/19 10:35 AM  Result Value Ref Range   WBC 9.2 3.8 - 10.8 Thousand/uL   RBC 4.33 3.80 - 5.10 Million/uL   Hemoglobin 14.4 11.7 - 15.5 g/dL   HCT 42.9 35 - 45 %   MCV 99.1 80.0 - 100.0 fL   MCH 33.3 (H) 27.0 - 33.0 pg   MCHC 33.6 32.0 - 36.0 g/dL   RDW 13.0 11.0 - 15.0 %   Platelets 374 140 - 400 Thousand/uL   MPV 11.5 7.5 - 12.5 fL   Neutro Abs 5,456 1,500 - 7,800 cells/uL   Lymphs Abs 2,852 850 - 3,900 cells/uL   Absolute Monocytes 727 200 - 950 cells/uL   Eosinophils Absolute 83 15.0 - 500.0 cells/uL   Basophils Absolute 83 0.0 - 200.0 cells/uL   Neutrophils Relative % 59.3 %   Total Lymphocyte 31.0 %   Monocytes Relative 7.9 %   Eosinophils Relative 0.9 %   Basophils Relative 0.9 %  COMPLETE METABOLIC PANEL WITH GFR     Status: None   Collection Time: 11/07/19 10:35 AM  Result Value Ref Range   Glucose, Bld 97 65 - 99 mg/dL    Comment: .            Fasting reference interval .    BUN 11 7 - 25 mg/dL   Creat 0.73 0.50 - 1.10 mg/dL   GFR, Est Non African American 100 > OR = 60 mL/min/1.27m   GFR, Est African American 116 > OR = 60 mL/min/1.774m  BUN/Creatinine Ratio NOT APPLICABLE 6 - 22 (calc)  Sodium 141 135 - 146 mmol/L   Potassium 4.1 3.5 - 5.3 mmol/L   Chloride 102 98 - 110 mmol/L   CO2 27 20 - 32 mmol/L   Calcium 9.9 8.6 - 10.2 mg/dL   Total Protein 7.3 6.1 - 8.1 g/dL   Albumin 4.5 3.6 - 5.1 g/dL   Globulin 2.8 1.9 - 3.7 g/dL (calc)   AG Ratio 1.6 1.0 - 2.5 (calc)   Total Bilirubin 0.5 0.2 - 1.2 mg/dL   Alkaline phosphatase  (APISO) 76 31 - 125 U/L   AST 11 10 - 30 U/L   ALT 9 6 - 29 U/L  Sedimentation rate     Status: Abnormal   Collection Time: 11/07/19 10:35 AM  Result Value Ref Range   Sed Rate 22 (H) 0 - 20 mm/h  Rheumatoid factor     Status: None   Collection Time: 11/07/19 10:35 AM  Result Value Ref Range   Rhuematoid fact SerPl-aCnc <16 <55 IU/mL  Cyclic citrul peptide antibody, IgG     Status: None   Collection Time: 11/07/19 10:35 AM  Result Value Ref Range   Cyclic Citrullin Peptide Ab <16 UNITS    Comment: Reference Range Negative:            <20 Weak Positive:       20-39 Moderate Positive:   40-59 Strong Positive:     >59 .   Hepatitis B core antibody, IgM     Status: None   Collection Time: 11/07/19 10:35 AM  Result Value Ref Range   Hep B C IgM NON-REACTIVE NON-REACTI  Hepatitis B surface antigen     Status: None   Collection Time: 11/07/19 10:35 AM  Result Value Ref Range   Hepatitis B Surface Ag NON-REACTIVE NON-REACTI  Hepatitis C antibody     Status: None   Collection Time: 11/07/19 10:35 AM  Result Value Ref Range   Hepatitis C Ab NON-REACTIVE NON-REACTI   SIGNAL TO CUT-OFF 0.00 <1.00    Comment: . HCV antibody was non-reactive. There is no laboratory  evidence of HCV infection. . In most cases, no further action is required. However, if recent HCV exposure is suspected, a test for HCV RNA (test code [phone]) is suggested. . For additional information please refer to http://education.[facility].com/faq/FAQ22v1 (This link is being provided for informational/ educational purposes only.) .   QuantiFERON-TB Gold Plus     Status: None   Collection Time: 11/07/19 10:35 AM  Result Value Ref Range   QuantiFERON-TB Gold Plus NEGATIVE NEGATIVE    Comment: Negative test result. M. tuberculosis complex  infection unlikely.    NIL 0.01 IU/mL   Mitogen-NIL >10.00 IU/mL   TB1-NIL 0.00 IU/mL   TB2-NIL 0.01 IU/mL    Comment: . The Nil tube value reflects the background  interferon gamma immune response of the patient's blood sample. This value has been subtracted from the patient's displayed TB and Mitogen results. . Lower than expected results with the Mitogen tube prevent false-negative Quantiferon readings by detecting a patient with a potential immune suppressive condition and/or suboptimal pre-analytical specimen handling. . The TB1 Antigen tube is coated with the M. tuberculosis-specific antigens designed to elicit responses from TB antigen primed CD4+ helper T-lymphocytes. . The TB2 Antigen tube is coated with the M. tuberculosis-specific antigens designed to elicit responses from TB antigen primed CD4+ helper and CD8+ cytotoxic T-lymphocytes. . For additional information, please refer to https://education.[facility].com/faq/FAQ204 (This link is being provided for informational/ educational purposes only.) .  Serum protein electrophoresis with reflex     Status: Abnormal   Collection Time: 11/07/19 10:35 AM  Result Value Ref Range   Total Protein 7.3 6.1 - 8.1 g/dL   Albumin ELP 4.2 3.8 - 4.8 g/dL   Alpha 1 0.4 (H) 0.2 - 0.3 g/dL   Alpha 2 1.0 (H) 0.5 - 0.9 g/dL   Beta Globulin 0.5 0.4 - 0.6 g/dL   Beta 2 0.3 0.2 - 0.5 g/dL   Gamma Globulin 0.9 0.8 - 1.7 g/dL   Abnormal Protein Band1 0.4 (H) NONE DETEC g/dL   SPE Interp.      Comment: . Evaluation reveals a restricted band (M-spike)  migrating in the gamma globulin region. If not already requested, Immunofixation should be considered.    IgG, IgA, IgM     Status: Abnormal   Collection Time: 11/07/19 10:35 AM  Result Value Ref Range   Immunoglobulin A 319 (H) 47 - 310 mg/dL   IgG (Immunoglobin G), Serum 798 600 - 1,640 mg/dL   IgM, Serum 72 50 - 300 mg/dL  IFE Interpretation     Status: None   Collection Time: 11/07/19 10:35 AM  Result Value Ref Range   Immunofix Electr Int IGA KAPPA MONOCLONAL PROTEIN DETECTED     Comment: . If Bence-Jones proteinuria (free light  chain) is a clinical concern, immunofixation analysis on 24 hour urine collection is suggested. Pleas Patricia DCA Hb A1c Waived     Status: None   Collection Time: 11/15/19 10:15 AM  Result Value Ref Range   HB A1C (BAYER DCA - WAIVED) 6.1 <7.0 %    Comment:                                       Diabetic Adult            <7.0                                       Healthy Adult        4.3 - 5.7                                                           (DCCT/NGSP) American Diabetes Association's Summary of Glycemic Recommendations for Adults with Diabetes: Hemoglobin A1c <7.0%. More stringent glycemic goals (A1c <6.0%) may further reduce complications at the cost of increased risk of hypoglycemia.   CMP14+EGFR     Status: Abnormal   Collection Time: 11/15/19 10:15 AM  Result Value Ref Range   Glucose 111 (H) 65 - 99 mg/dL   BUN 8 6 - 24 mg/dL   Creatinine, Ser 0.81 0.57 - 1.00 mg/dL   GFR calc non Af Amer 89 >59 mL/min/1.73   GFR calc Af Amer 102 >59 mL/min/1.73    Comment: **In accordance with recommendations from the NKF-ASN Task force,**   [facility] is in the process of updating its eGFR calculation to the   2021 CKD-EPI creatinine equation that estimates kidney function   without a race variable.    BUN/Creatinine Ratio 10 9 - 23   Sodium 142 134 - 144  mmol/L   Potassium 4.1 3.5 - 5.2 mmol/L   Chloride 102 96 - 106 mmol/L   CO2 25 20 - 29 mmol/L   Calcium 9.8 8.7 - 10.2 mg/dL   Total Protein 7.2 6.0 - 8.5 g/dL   Albumin 4.6 3.8 - 4.8 g/dL   Globulin, Total 2.6 1.5 - 4.5 g/dL   Albumin/Globulin Ratio 1.8 1.2 - 2.2   Bilirubin Total 0.4 0.0 - 1.2 mg/dL   Alkaline Phosphatase 82 44 - 121 IU/L    Comment:               **Please note reference interval change**   AST 9 0 - 40 IU/L   ALT 10 0 - 32 IU/L  VITAMIN D 25 Hydroxy (Vit-D Deficiency, Fractures)     Status: None   Collection Time: 11/15/19 10:15 AM  Result Value Ref Range   Vit D, 25-Hydroxy 40.1 30.0 - 100.0 ng/mL     Comment: Vitamin D deficiency has been defined by the Institute of Medicine and an Endocrine Society practice guideline as a level of serum 25-OH vitamin D less than 20 ng/mL (1,2). The Endocrine Society went on to further define vitamin D insufficiency as a level between 21 and 29 ng/mL (2). 1. IOM (Institute of Medicine). 2010. Dietary reference    intakes for calcium and D. [place]: The    [employer]. 2. Holick MF, Binkley [place], Bischoff-Ferrari HA, et al.    Evaluation, treatment, and prevention of vitamin D    deficiency: an Endocrine Society clinical practice    guideline. JCEM. 2011 Jul; 96(7):1911-30.   TSH     Status: None   Collection Time: 11/15/19 10:15 AM  Result Value Ref Range   TSH 1.370 0.450 - 4.500 uIU/mL  Vitamin B12     Status: None   Collection Time: 11/15/19 10:15 AM  Result Value Ref Range   Vitamin B-12 310 232 - 1,245 pg/mL  CBC with Differential/Platelet     Status: Abnormal   Collection Time: 12/12/19  3:31 PM  Result Value Ref Range   WBC 9.7 4.0 - 10.5 K/uL   RBC 4.32 3.87 - 5.11 MIL/uL   Hemoglobin 14.0 12.0 - 15.0 g/dL   HCT 43.3 36 - 46 %   MCV 100.2 (H) 80.0 - 100.0 fL   MCH 32.4 26.0 - 34.0 pg   MCHC 32.3 30.0 - 36.0 g/dL   RDW 13.2 11.5 - 15.5 %   Platelets 371 150 - 400 K/uL   nRBC 0.0 0.0 - 0.2 %   Neutrophils Relative % 60 %   Neutro Abs 5.8 1.7 - 7.7 K/uL   Lymphocytes Relative 31 %   Lymphs Abs 3.1 0.7 - 4.0 K/uL   Monocytes Relative 7 %   Monocytes Absolute 0.7 0.1 - 1.0 K/uL   Eosinophils Relative 1 %   Eosinophils Absolute 0.1 0.0 - 0.5 K/uL   Basophils Relative 1 %   Basophils Absolute 0.1 0.0 - 0.1 K/uL   Immature Granulocytes 0 %   Abs Immature Granulocytes 0.02 0.00 - 0.07 K/uL    Comment: Performed at [facility], [address], [place], [place] [address]  Lactate dehydrogenase     Status: None   Collection Time: 12/12/19  3:31 PM  Result Value Ref Range   LDH 98 98 - 192 U/L    Comment: Performed  at [facility], [address], [place],  [address]  Comprehensive metabolic panel  Status: Abnormal   Collection Time: 12/12/19  3:31 PM  Result Value Ref Range   Sodium 137 135 - 145 mmol/L   Potassium 4.0 3.5 - 5.1 mmol/L   Chloride 104 98 - 111 mmol/L   CO2 23 22 - 32 mmol/L   Glucose, Bld 99 70 - 99 mg/dL    Comment: Glucose reference range applies only to samples taken after fasting for at least 8 hours.   BUN 14 6 - 20 mg/dL   Creatinine, Ser 0.69 0.44 - 1.00 mg/dL   Calcium 8.9 8.9 - 10.3 mg/dL   Total Protein 7.4 6.5 - 8.1 g/dL   Albumin 4.0 3.5 - 5.0 g/dL   AST 13 (L) 15 - 41 U/L   ALT 10 0 - 44 U/L   Alkaline Phosphatase 62 38 - 126 U/L   Total Bilirubin 0.3 0.3 - 1.2 mg/dL   GFR, Estimated >60 >60 mL/min    Comment: (NOTE) Calculated using the CKD-EPI Creatinine Equation (2021)    Anion gap 10 5 - 15    Comment: Performed at [facility], [address] [place], [place] [address]    RADIOGRAPHIC STUDIES: I have personally reviewed the radiological images as listed and agreed with the findings in the report. MM 3D SCREEN BREAST BILATERAL  Result Date: 12/04/2019 CLINICAL DATA:  Screening. EXAM: DIGITAL SCREENING BILATERAL MAMMOGRAM WITH TOMO AND CAD COMPARISON:  Previous exam(s). ACR Breast Density Category b: There are scattered areas of fibroglandular density. FINDINGS: There are no findings suspicious for malignancy. Images were processed with CAD. IMPRESSION: No mammographic evidence of malignancy. A result letter of this screening mammogram will be mailed directly to the patient. RECOMMENDATION: Screening mammogram in one year. (Code:SM-B-01Y) BI-RADS CATEGORY  1: Negative. Electronically Signed   By: Dorise Bullion III M.D   On: 12/04/2019 08:27    ASSESSMENT & PLAN:  Elevated M protein: -Discovered during work-up by rheumatology for psoriatic arthritis. -Noted to have an abnormal SPEP with an M spike of 0.4.  IgA kappa. -All other lab work appears  to be within normal limits -No personal or family history of blood disorders or cancer per patient. -She does not have any B symptoms, new bone pains. No prior history of blood transfusions.  -We discussed crab criteria and additional work-up needed:  -Her corrected calcium level, hemoglobin, kidney function are all WNL.   -Will get bone scan to rule out bone lesions.  -Lab work today-LDH, beta-2 microglobin, serum free light chains, 24-hour urine-UPEP with reflex  -Repeat CBC and CMP  -We discussed differential diagnosis including MGUS, smoldering myeloma and multiple myeloma.  -Given, CBC and CMP are within normal range this is likely MGUS and will not require a bone marrow biopsy at this time.  Simple observation with lab work will likely be needed.  She can likely be evaluated every 3 to 6 months based on labs stability  Psoriatic arthritis: -Previously started on methotrexate and topical creams.  Unfortunately developed an anaphylactic reaction to methotrexate.  This was discontinued. -Recently prescribed Otezla.  Has not started.   Diabetes type 2: -On Metformin -A1c 6.1  HIV: -Followed by infectious disease every 6 months. -Well controlled-viral load undetectable per patient  Hypertension: -Stable on lisinopril, amlodipine and Lasix.  Social history: -No family history of blood disorders or cancer per patient. -Distant family relatives had esophageal cancer. -Current everyday smoker for the past 25 years.  Approximately 1 pack/day.  Disposition: RTC in 2 to 3 weeks to review  labs and discuss imaging.  Addendum: IgA kappa MGUS: I have independently assessed this patient and agree with the HPI written by my nurse practitioner Faythe Casa, NP.  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 Estanislado Pandy.  She has psoriatic arthritis for which she is on [place].  We have discussed plasma cell disorders in general and MGUS in  particular.  She had CBC, creatinine and calcium which were grossly within normal limits.  Will complete work-up by checking free light chains, 24-hour urine for myeloma studies, skeletal survey.  If she has any suspicious bone lesions, would recommend further work-up with PET scan and bone marrow biopsy.  She will be evaluated back in 2 to 3 weeks to discuss further plan.     All questions were answered. The patient knows to call the clinic with any problems, questions or concerns.     Derek Jack, MD 12/12/19 6:39 PM

## 2019-12-13 LAB — KAPPA/LAMBDA LIGHT CHAINS
Kappa free light chain: 20.5 mg/L — ABNORMAL HIGH (ref 3.3–19.4)
Kappa, lambda light chain ratio: 1.56 (ref 0.26–1.65)
Lambda free light chains: 13.1 mg/L (ref 5.7–26.3)

## 2019-12-13 LAB — BETA 2 MICROGLOBULIN, SERUM: Beta-2 Microglobulin: 1.8 mg/L (ref 0.6–2.4)

## 2019-12-16 ENCOUNTER — Ambulatory Visit (HOSPITAL_COMMUNITY)
Admission: RE | Admit: 2019-12-16 | Discharge: 2019-12-16 | Disposition: A | Discharge status: Home / Self Care | Payer: Medicaid Other | Source: Ambulatory Visit | Attending: Oncology | Admitting: Oncology

## 2019-12-16 ENCOUNTER — Other Ambulatory Visit: Payer: Self-pay

## 2019-12-16 DIAGNOSIS — Z13820 Encounter for screening for osteoporosis: Secondary | ICD-10-CM | POA: Diagnosis not present

## 2019-12-16 DIAGNOSIS — Z8739 Personal history of other diseases of the musculoskeletal system and connective tissue: Secondary | ICD-10-CM | POA: Diagnosis not present

## 2019-12-16 DIAGNOSIS — D472 Monoclonal gammopathy: Secondary | ICD-10-CM

## 2019-12-16 LAB — UIFE/LIGHT CHAINS/TP QN, 24-HR UR
FR KAPPA LT CH,24HR: 24.02 mg/24 hr
FR LAMBDA LT CH,24HR: 3.25 mg/24 hr
Free Kappa Lt Chains,Ur: 17.47 mg/L (ref 0.63–113.79)
Free Kappa/Lambda Ratio: 7.4 (ref 1.03–31.76)
Free Lambda Lt Chains,Ur: 2.36 mg/L (ref 0.47–11.77)
Total Protein, Urine-Ur/day: 281 mg/24 hr — ABNORMAL HIGH (ref 30–150)
Total Protein, Urine: 20.4 mg/dL
Total Volume: 1375

## 2019-12-20 ENCOUNTER — Other Ambulatory Visit: Payer: Self-pay | Admitting: Oncology

## 2019-12-20 DIAGNOSIS — D472 Monoclonal gammopathy: Secondary | ICD-10-CM

## 2019-12-20 NOTE — Progress Notes (Signed)
Re: monoclonal gammopathy  Patient needs a bone scan not a DEXA scan.  Orders placed.  Faythe Casa, NP 12/20/2019 8:44 AM

## 2019-12-27 ENCOUNTER — Ambulatory Visit (HOSPITAL_COMMUNITY): Payer: Medicaid Other | Admitting: Oncology

## 2019-12-27 DIAGNOSIS — D472 Monoclonal gammopathy: Secondary | ICD-10-CM

## 2019-12-30 ENCOUNTER — Other Ambulatory Visit: Payer: Self-pay | Admitting: Family

## 2019-12-30 DIAGNOSIS — E1142 Type 2 diabetes mellitus with diabetic polyneuropathy: Secondary | ICD-10-CM

## 2020-01-12 ENCOUNTER — Other Ambulatory Visit: Payer: Self-pay

## 2020-01-12 ENCOUNTER — Ambulatory Visit (HOSPITAL_COMMUNITY)
Admission: RE | Admit: 2020-01-12 | Discharge: 2020-01-12 | Disposition: A | Discharge status: Home / Self Care | Payer: Medicaid Other | Source: Ambulatory Visit | Attending: Oncology | Admitting: Oncology

## 2020-01-12 ENCOUNTER — Inpatient Hospital Stay (HOSPITAL_COMMUNITY): Payer: Medicaid Other | Attending: Hematology | Admitting: Oncology

## 2020-01-12 VITALS — BP 141/90 | HR 85 | Temp 97.1°F | Resp 17 | Wt 271.6 lb

## 2020-01-12 DIAGNOSIS — E559 Vitamin D deficiency, unspecified: Secondary | ICD-10-CM | POA: Diagnosis not present

## 2020-01-12 DIAGNOSIS — Z6841 Body Mass Index (BMI) 40.0 and over, adult: Secondary | ICD-10-CM | POA: Insufficient documentation

## 2020-01-12 DIAGNOSIS — Z7984 Long term (current) use of oral hypoglycemic drugs: Secondary | ICD-10-CM | POA: Insufficient documentation

## 2020-01-12 DIAGNOSIS — L4050 Arthropathic psoriasis, unspecified: Secondary | ICD-10-CM | POA: Insufficient documentation

## 2020-01-12 DIAGNOSIS — I10 Essential (primary) hypertension: Secondary | ICD-10-CM | POA: Diagnosis not present

## 2020-01-12 DIAGNOSIS — Z79899 Other long term (current) drug therapy: Secondary | ICD-10-CM | POA: Diagnosis not present

## 2020-01-12 DIAGNOSIS — Z21 Asymptomatic human immunodeficiency virus [HIV] infection status: Secondary | ICD-10-CM | POA: Insufficient documentation

## 2020-01-12 DIAGNOSIS — F17200 Nicotine dependence, unspecified, uncomplicated: Secondary | ICD-10-CM | POA: Diagnosis not present

## 2020-01-12 DIAGNOSIS — E119 Type 2 diabetes mellitus without complications: Secondary | ICD-10-CM | POA: Diagnosis not present

## 2020-01-12 DIAGNOSIS — F329 Major depressive disorder, single episode, unspecified: Secondary | ICD-10-CM | POA: Diagnosis not present

## 2020-01-12 DIAGNOSIS — D472 Monoclonal gammopathy: Secondary | ICD-10-CM | POA: Insufficient documentation

## 2020-01-12 NOTE — Progress Notes (Signed)
CONSULT NOTE  Patient Care Team: Jasmine Balloon, Jasmine Peck as PCP - General (Nurse Practitioner)  CHIEF COMPLAINTS/PURPOSE OF CONSULTATION: abnormal SPEP  HISTORY OF PRESENTING ILLNESS:  Jasmine Peck 44 y.o. female is here because of an abnormal SPEP.  M spike elevated at 0.4.  Jasmine Peck is a 44 year old female with past medical history significant for hypertension, type 2 diabetes, hyperlipidemia, psoriatic arthritis, depression, vitamin D deficiency, morbid obesity, tobacco abuse and HIV.  She is followed closely by infectious disease and currently her viral load is undetectable.  She is followed by dermatology for psoriasis and has been treated with topical agents and methotrexate which caused a severe anaphylactic reaction.  Incidentally noted to have an elevated sedimentation rate.  She was referred to rheumatology for further work-up and evaluation of psoriasis and arthralgias in her right knee and RCP joint.  She was started on Otezla for her psoriasis.  Prior to initiating medication, several labs were checked revealing abnormality in her SPEP.  She is a current everyday smoker approximately 1 pack/day for the last 30 years.  She is obese with a BMI greater than 45.  She denies any family history of blood disorders.  Admits to a distant relative who had esophageal cancer but otherwise most of her family has been healthy.  She lives at home with her mom who is her support.  She has a 35-year-old daughter and a 46 year old grandson who is autistic.  She was last seen in clinic on 12/12/2019 for consultation for elevated M spike.  Since her visit, she has done okay.  States that she has lots of stress in her life.  She is very emotional.  Her mother accompanies her today.  Her joint pain is stable.  She has not started her [place].  She is not interested in adding a new medication at this time.  She denies any new aches or pains.  Notes weight loss.  Her mother states it is because "she  does not eat enough".  She continues to work full-time and states she eats when she gets home.  No new concerns.    MEDICAL HISTORY:  Past Medical History:  Diagnosis Date  . Anxiety   . Depression   . Diabetes mellitus without complication ([place])   . GERD (gastroesophageal reflux disease)   . HIV infection ([place])   . Hypertension   . Psoriasis    per patient     SURGICAL HISTORY: Past Surgical History:  Procedure Laterality Date  . CHOLECYSTECTOMY    . DILATION AND CURETTAGE OF UTERUS    . MOUTH SURGERY     all teeth have been removed     SOCIAL HISTORY: Social History   Socioeconomic History  . Marital status: Divorced    Spouse name: Not on file  . Number of children: Not on file  . Years of education: Not on file  . Highest education level: Not on file  Occupational History  . Not on file  Tobacco Use  . Smoking status: Current Every Day Smoker    Packs/day: 0.50  . Smokeless tobacco: Never Used  Vaping Use  . Vaping Use: Never used  Substance and Sexual Activity  . Alcohol use: No    Alcohol/week: 0.0 standard drinks  . Drug use: No  . Sexual activity: Never  Other Topics Concern  . Not on file  Social History Narrative  . Not on file   Social Determinants of Health   Financial Resource Strain: Low  Risk   . Difficulty of Paying Living Expenses: Not hard at all  Food Insecurity: No Food Insecurity  . Worried About Charity fundraiser in the Last Year: Never true  . Ran Out of Food in the Last Year: Never true  Transportation Needs: No Transportation Needs  . Lack of Transportation (Medical): No  . Lack of Transportation (Non-Medical): No  Physical Activity: Inactive  . Days of Exercise per Week: 0 days  . Minutes of Exercise per Session: 0 min  Stress: Stress Concern Present  . Feeling of Stress : Rather much  Social Connections: Moderately Isolated  . Frequency of Communication with Friends and Family: More than three times a week  . Frequency  of Social Gatherings with Friends and Family: Three times a week  . Attends Religious Services: Never  . Active Member of Clubs or Organizations: Yes  . Attends Archivist Meetings: Never  . Marital Status: Never married  Intimate Partner Violence: Not At Risk  . Fear of Current or Ex-Partner: No  . Emotionally Abused: No  . Physically Abused: No  . Sexually Abused: No    FAMILY HISTORY: Family History  Problem Relation Age of Onset  . Heart disease Mother   . Diabetes Mother   . Hypertension Mother   . Early death Father   . Diabetes Father   . Hypertension Father   . Diabetes Brother   . Hypertension Brother   . Hyperlipidemia Brother   . Heart disease Maternal Grandmother   . Diabetes Maternal Grandmother   . Diabetes Paternal Grandfather   . Heart disease Paternal Grandfather   . GER disease Daughter   . Healthy Daughter   . Healthy Daughter   . Healthy Daughter   . Asthma Daughter     ALLERGIES:  is allergic to clindamycin/lincomycin and erythromycin.  MEDICATIONS:  Current Outpatient Medications  Medication Sig Dispense Refill  . abacavir-dolutegravir-lamiVUDine (TRIUMEQ) 600-50-300 MG tablet TAKE 1 TABLET BY MOUTH DAILY.    Marland Kitchen amLODipine (NORVASC) 10 MG tablet Take 1 tablet (10 mg total) by mouth daily. 90 tablet 3  . cetirizine (ZYRTEC) 10 MG tablet Take 1 tablet (10 mg total) by mouth daily. 90 tablet 4  . furosemide (LASIX) 20 MG tablet Take 20 mg by mouth daily.    Marland Kitchen gabapentin (NEURONTIN) 600 MG tablet TAKE ONE TABLET BY MOUTH THREE TIMES DAILY 90 tablet 1  . glucose blood (ACCU-CHEK AVIVA PLUS) test strip Check BS daily Dx E11.9 100 strip 3  . lisinopril (ZESTRIL) 30 MG tablet Take 1 tablet (30 mg total) by mouth daily. 90 tablet 2  . metFORMIN (GLUCOPHAGE) 500 MG tablet Take 1 tablet (500 mg total) by mouth 2 (two) times daily with a meal. TAKE TWO TABLETS BY MOUTH 2 TIMES A DAY WITH MEALS (Patient taking differently: Take 500 mg by mouth 2 (two)  times daily with a meal. TAKE ONE TABLETS BY MOUTH 2 TIMES A DAY WITH MEALS) 180 tablet 2  . nicotine (NICODERM CQ - DOSED IN MG/24 HOURS) 21 mg/24hr patch Place 1 patch (21 mg total) onto the skin daily. 28 patch 0  . omeprazole (PRILOSEC) 20 MG capsule TAKE ONE CAPSULE BY MOUTH EVERY DAY, NEEDS APPOINTMENT FOR REFILLS 90 capsule 4  . OVER THE COUNTER MEDICATION     . sertraline (ZOLOFT) 50 MG tablet Take 50 mg by mouth daily.     No current facility-administered medications for this visit.    REVIEW OF SYSTEMS:  Review of Systems  Constitutional: Negative.  Negative for chills, fever, malaise/fatigue and weight loss.  HENT: Negative for congestion, ear pain and tinnitus.   Eyes: Negative.  Negative for blurred vision and double vision.  Respiratory: Negative.  Negative for cough, sputum production and shortness of breath.   Cardiovascular: Negative.  Negative for chest pain, palpitations and leg swelling.  Gastrointestinal: Negative.  Negative for abdominal pain, constipation, diarrhea, nausea and vomiting.  Genitourinary: Negative for dysuria, frequency and urgency.  Musculoskeletal: Positive for joint pain and myalgias. Negative for back pain and falls.  Skin: Negative for rash.  Neurological: Negative.  Negative for weakness and headaches.  Endo/Heme/Allergies: Negative.  Does not bruise/bleed easily.  Psychiatric/Behavioral: Negative for depression. The patient is nervous/anxious and has insomnia.      PHYSICAL EXAMINATION: ECOG PERFORMANCE STATUS: 1 - Symptomatic but completely ambulatory   Physical Exam Constitutional:      Appearance: Normal appearance.  HENT:     Head: Normocephalic and atraumatic.  Eyes:     Pupils: Pupils are equal, round, and reactive to light.  Cardiovascular:     Rate and Rhythm: Normal rate and regular rhythm.     Heart sounds: Normal heart sounds. No murmur heard.   Pulmonary:     Effort: Pulmonary effort is normal.     Breath sounds:  Normal breath sounds. No wheezing.  Abdominal:     General: Bowel sounds are normal. There is no distension.     Palpations: Abdomen is soft.     Tenderness: There is no abdominal tenderness.  Musculoskeletal:        General: Normal range of motion.     Cervical back: Normal range of motion.  Skin:    General: Skin is warm and dry.     Findings: No rash.  Neurological:     Mental Status: She is alert and oriented to person, place, and time.  Psychiatric:        Judgment: Judgment normal.      There were no vitals filed for this visit. There were no vitals filed for this visit.    LABORATORY DATA:  I have reviewed the data as listed Recent Results (from the past 2160 hour(s))  CBC with Differential/Platelet     Status: Abnormal   Collection Time: 11/07/19 10:35 AM  Result Value Ref Range   WBC 9.2 3.8 - 10.8 Thousand/uL   RBC 4.33 3.80 - 5.10 Million/uL   Hemoglobin 14.4 11.7 - 15.5 g/dL   HCT 42.9 35.0 - 45.0 %   MCV 99.1 80.0 - 100.0 fL   MCH 33.3 (H) 27.0 - 33.0 pg   MCHC 33.6 32.0 - 36.0 g/dL   RDW 13.0 11.0 - 15.0 %   Platelets 374 140 - 400 Thousand/uL   MPV 11.5 7.5 - 12.5 fL   Neutro Abs 5,456 1,500 - 7,800 cells/uL   Lymphs Abs 2,852 850 - 3,900 cells/uL   Absolute Monocytes 727 200 - 950 cells/uL   Eosinophils Absolute 83 15 - 500 cells/uL   Basophils Absolute 83 0 - 200 cells/uL   Neutrophils Relative % 59.3 %   Total Lymphocyte 31.0 %   Monocytes Relative 7.9 %   Eosinophils Relative 0.9 %   Basophils Relative 0.9 %  COMPLETE METABOLIC PANEL WITH GFR     Status: None   Collection Time: 11/07/19 10:35 AM  Result Value Ref Range   Glucose, Bld 97 65 - 99 mg/dL    Comment: .  Fasting reference interval .    BUN 11 7 - 25 mg/dL   Creat 0.73 0.50 - 1.10 mg/dL   GFR, Est Non African American 100 > OR = 60 mL/min/1.31m   GFR, Est African American 116 > OR = 60 mL/min/1.747m  BUN/Creatinine Ratio NOT APPLICABLE 6 - 22 (calc)   Sodium 141  135 - 146 mmol/L   Potassium 4.1 3.5 - 5.3 mmol/L   Chloride 102 98 - 110 mmol/L   CO2 27 20 - 32 mmol/L   Calcium 9.9 8.6 - 10.2 mg/dL   Total Protein 7.3 6.1 - 8.1 g/dL   Albumin 4.5 3.6 - 5.1 g/dL   Globulin 2.8 1.9 - 3.7 g/dL (calc)   AG Ratio 1.6 1.0 - 2.5 (calc)   Total Bilirubin 0.5 0.2 - 1.2 mg/dL   Alkaline phosphatase (APISO) 76 31 - 125 U/L   AST 11 10 - 30 U/L   ALT 9 6 - 29 U/L  Sedimentation rate     Status: Abnormal   Collection Time: 11/07/19 10:35 AM  Result Value Ref Range   Sed Rate 22 (H) 0 - 20 mm/h  Rheumatoid factor     Status: None   Collection Time: 11/07/19 10:35 AM  Result Value Ref Range   Rhuematoid fact SerPl-aCnc <1<441<81U/mL  Cyclic citrul peptide antibody, IgG     Status: None   Collection Time: 11/07/19 10:35 AM  Result Value Ref Range   Cyclic Citrullin Peptide Ab <16 UNITS    Comment: Reference Range Negative:            <20 Weak Positive:       20-39 Moderate Positive:   40-59 Strong Positive:     >59 .   Hepatitis B core antibody, IgM     Status: None   Collection Time: 11/07/19 10:35 AM  Result Value Ref Range   Hep B C IgM NON-REACTIVE NON-REACTI  Hepatitis B surface antigen     Status: None   Collection Time: 11/07/19 10:35 AM  Result Value Ref Range   Hepatitis B Surface Ag NON-REACTIVE NON-REACTI  Hepatitis C antibody     Status: None   Collection Time: 11/07/19 10:35 AM  Result Value Ref Range   Hepatitis C Ab NON-REACTIVE NON-REACTI   SIGNAL TO CUT-OFF 0.00 <1.00    Comment: . HCV antibody was non-reactive. There is no laboratory  evidence of HCV infection. . In most cases, no further action is required. However, if recent HCV exposure is suspected, a test for HCV RNA (test code [id number] suggested. . For additional information please refer to http://education.questdiagn[facility].com/faq/FAQ22v1 (This link is being provided for informational/ educational purposes only.) .   QuantiFERON-TB Gold Plus     Status: None    Collection Time: 11/07/19 10:35 AM  Result Value Ref Range   QuantiFERON-TB Gold Plus NEGATIVE NEGATIVE    Comment: Negative test result. M. tuberculosis complex  infection unlikely.    NIL 0.01 IU/mL   Mitogen-NIL >10.00 IU/mL   TB1-NIL 0.00 IU/mL   TB2-NIL 0.01 IU/mL    Comment: . The Nil tube value reflects the background interferon gamma immune response of the patient's blood sample. This value has been subtracted from the patient's displayed TB and Mitogen results. . Lower than expected results with the Mitogen tube prevent false-negative Quantiferon readings by detecting a patient with a potential immune suppressive condition and/or suboptimal pre-analytical specimen handling. . The TB1 Antigen tube is coated with the M.  tuberculosis-specific antigens designed to elicit responses from TB antigen primed CD4+ helper T-lymphocytes. . The TB2 Antigen tube is coated with the M. tuberculosis-specific antigens designed to elicit responses from TB antigen primed CD4+ helper and CD8+ cytotoxic T-lymphocytes. . For additional information, please refer to https://education.quest[facility].com/faq/FAQ204 (This link is being provided for informational/ educational purposes only.) .   Serum protein electrophoresis with reflex     Status: Abnormal   Collection Time: 11/07/19 10:35 AM  Result Value Ref Range   Total Protein 7.3 6.1 - 8.1 g/dL   Albumin ELP 4.2 3.8 - 4.8 g/dL   Alpha 1 0.4 (H) 0.2 - 0.3 g/dL   Alpha 2 1.0 (H) 0.5 - 0.9 g/dL   Beta Globulin 0.5 0.4 - 0.6 g/dL   Beta 2 0.3 0.2 - 0.5 g/dL   Gamma Globulin 0.9 0.8 - 1.7 g/dL   Abnormal Protein Band1 0.4 (H) NONE DETEC g/dL   SPE Interp.      Comment: . Evaluation reveals a restricted band (M-spike)  migrating in the gamma globulin region. If not already requested, Immunofixation should be considered.    IgG, IgA, IgM     Status: Abnormal   Collection Time: 11/07/19 10:35 AM  Result Value Ref Range    Immunoglobulin A 319 (H) 47 - 310 mg/dL   IgG (Immunoglobin G), Serum 798 600 - 1,640 mg/dL   IgM, Serum 72 50 - 300 mg/dL  IFE Interpretation     Status: None   Collection Time: 11/07/19 10:35 AM  Result Value Ref Range   Immunofix Electr Int IGA KAPPA MONOCLONAL PROTEIN DETECTED     Comment: . If Bence-Jones proteinuria (free light chain) is a clinical concern, immunofixation analysis on 24 hour urine collection is suggested. Pleas Patricia DCA Hb A1c Waived     Status: None   Collection Time: 11/15/19 10:15 AM  Result Value Ref Range   HB A1C (BAYER DCA - WAIVED) 6.1 <7.0 %    Comment:                                       Diabetic Adult            <7.0                                       Healthy Adult        4.3 - 5.7                                                           (DCCT/NGSP) American Diabetes Association's Summary of Glycemic Recommendations for Adults with Diabetes: Hemoglobin A1c <7.0%. More stringent glycemic goals (A1c <6.0%) may further reduce complications at the cost of increased risk of hypoglycemia.   CMP14+EGFR     Status: Abnormal   Collection Time: 11/15/19 10:15 AM  Result Value Ref Range   Glucose 111 (H) 65 - 99 mg/dL   BUN 8 6 - 24 mg/dL   Creatinine, Ser 0.81 0.57 - 1.00 mg/dL   GFR calc non Af Amer 89 >59 mL/min/1.73   GFR calc Af Amer 102 >59  mL/min/1.73    Comment: **In accordance with recommendations from the NKF-ASN Task force,**   [facility] is in the process of updating its eGFR calculation to the   2021 CKD-EPI creatinine equation that estimates kidney function   without a race variable.    BUN/Creatinine Ratio 10 9 - 23   Sodium 142 134 - 144 mmol/L   Potassium 4.1 3.5 - 5.2 mmol/L   Chloride 102 96 - 106 mmol/L   CO2 25 20 - 29 mmol/L   Calcium 9.8 8.7 - 10.2 mg/dL   Total Protein 7.2 6.0 - 8.5 g/dL   Albumin 4.6 3.8 - 4.8 g/dL   Globulin, Total 2.6 1.5 - 4.5 g/dL   Albumin/Globulin Ratio 1.8 1.2 - 2.2   Bilirubin Total 0.4 0.0 -  1.2 mg/dL   Alkaline Phosphatase 82 44 - 121 IU/L    Comment:               **Please note reference interval change**   AST 9 0 - 40 IU/L   ALT 10 0 - 32 IU/L  VITAMIN D 25 Hydroxy (Vit-D Deficiency, Fractures)     Status: None   Collection Time: 11/15/19 10:15 AM  Result Value Ref Range   Vit D, 25-Hydroxy 40.1 30.0 - 100.0 ng/mL    Comment: Vitamin D deficiency has been defined by the Institute of Medicine and an Endocrine Society practice guideline as a level of serum 25-OH vitamin D less than 20 ng/mL (1,2). The Endocrine Society went on to further define vitamin D insufficiency as a level between 21 and 29 ng/mL (2). 1. IOM (Institute of Medicine). 2010. Dietary reference    intakes for calcium and D. [place]: The    [employer]. 2. Holick MF, Binkley [place], Bischoff-Ferrari HA, et al.    Evaluation, treatment, and prevention of vitamin D    deficiency: an Endocrine Society clinical practice    guideline. JCEM. 2011 Jul; 96(7):1911-30.   TSH     Status: None   Collection Time: 11/15/19 10:15 AM  Result Value Ref Range   TSH 1.370 0.450 - 4.500 uIU/mL  Vitamin B12     Status: None   Collection Time: 11/15/19 10:15 AM  Result Value Ref Range   Vitamin B-12 310 232 - 1,245 pg/mL  CBC with Differential/Platelet     Status: Abnormal   Collection Time: 12/12/19  3:31 PM  Result Value Ref Range   WBC 9.7 4.0 - 10.5 K/uL   RBC 4.32 3.87 - 5.11 MIL/uL   Hemoglobin 14.0 12.0 - 15.0 g/dL   HCT 43.3 36.0 - 46.0 %   MCV 100.2 (H) 80.0 - 100.0 fL   MCH 32.4 26.0 - 34.0 pg   MCHC 32.3 30.0 - 36.0 g/dL   RDW 13.2 11.5 - 15.5 %   Platelets 371 150 - 400 K/uL   nRBC 0.0 0.0 - 0.2 %   Neutrophils Relative % 60 %   Neutro Abs 5.8 1.7 - 7.7 K/uL   Lymphocytes Relative 31 %   Lymphs Abs 3.1 0.7 - 4.0 K/uL   Monocytes Relative 7 %   Monocytes Absolute 0.7 0.1 - 1.0 K/uL   Eosinophils Relative 1 %   Eosinophils Absolute 0.1 0.0 - 0.5 K/uL   Basophils Relative 1 %    Basophils Absolute 0.1 0.0 - 0.1 K/uL   Immature Granulocytes 0 %   Abs Immature Granulocytes 0.02 0.00 - 0.07 K/uL    Comment: Performed at [facility]  [facility], [address] [place], [place] [address]  Kappa/lambda light chains     Status: Abnormal   Collection Time: 12/12/19  3:31 PM  Result Value Ref Range   Kappa free light chain 20.5 (H) 3.3 - 19.4 mg/L   Lamda free light chains 13.1 5.7 - 26.3 mg/L   Kappa, lamda light chain ratio 1.56 0.26 - 1.65    Comment: (NOTE) Performed At: [facility] [place], [place] [id number] Rush Farmer MD [id number]   Beta 2 microglobuline, serum     Status: None   Collection Time: 12/12/19  3:31 PM  Result Value Ref Range   Beta-2 Microglobulin 1.8 0.6 - 2.4 mg/L    Comment: (NOTE) Siemens Immulite 2000 Immunochemiluminometric assay (ICMA) Values obtained with different assay methods or kits cannot be used interchangeably. Results cannot be interpreted as absolute evidence of the presence or absence of malignant disease. Performed At: [facility] [place], [place] [id number] Rush Farmer MD HW:[id number]   Lactate dehydrogenase     Status: None   Collection Time: 12/12/19  3:31 PM  Result Value Ref Range   LDH 98 98 - 192 U/L    Comment: Performed at [facility], [address], [place], [place] [address]  Comprehensive metabolic panel     Status: Abnormal   Collection Time: 12/12/19  3:31 PM  Result Value Ref Range   Sodium 137 135 - 145 mmol/L   Potassium 4.0 3.5 - 5.1 mmol/L   Chloride 104 98 - 111 mmol/L   CO2 23 22 - 32 mmol/L   Glucose, Bld 99 70 - 99 mg/dL    Comment: Glucose reference range applies only to samples taken after fasting for at least 8 hours.   BUN 14 6 - 20 mg/dL   Creatinine, Ser 0.69 0.44 - 1.00 mg/dL   Calcium 8.9 8.9 - 10.3 mg/dL   Total Protein 7.4 6.5 - 8.1 g/dL   Albumin 4.0 3.5 - 5.0 g/dL   AST 13 (L) 15 - 41 U/L   ALT 10 0 - 44 U/L   Alkaline  Phosphatase 62 38 - 126 U/L   Total Bilirubin 0.3 0.3 - 1.2 mg/dL   GFR, Estimated >60 >60 mL/min    Comment: (NOTE) Calculated using the CKD-EPI Creatinine Equation (2021)    Anion gap 10 5 - 15    Comment: Performed at [facility], [address] [place], [place] [address]  24 Hr Urn UIFE/Light Chains/TP QN     Status: Abnormal   Collection Time: 12/14/19 10:33 AM  Result Value Ref Range   Free Kappa Lt Chains,Ur 17.47 0.63 - 113.79 mg/L   FR KAPPA LT CH,24HR 24.02 Undefined mg/24 hr   Free Lambda Lt Chains,Ur 2.36 0.47 - 11.77 mg/L   FR LAMBDA LT CH,24HR 3.25 Undefined mg/24 hr   Free Kappa/Lambda Ratio 7.40 1.03 - 31.76   Immunofixation, Urine Comment     Comment: (NOTE) The immunofixation pattern appears unremarkable. Evidence of monoclonal protein is not apparent.    Total Protein, Urine 20.4 Not Estab. mg/dL   Total Protein, Urine-Ur/day 281 (H) 30 - 150 mg/24 hr    Comment: (NOTE) Performed At: [facility] [place], [place] [id number] Rush Farmer MD [id number]    Total Volume 1,375     Comment: Performed at [facility], [address], [place], [place] [address]    RADIOGRAPHIC STUDIES: I have personally reviewed the radiological images as listed and agreed  with the findings in the report. DG BONE DENSITY (DXA)  Result Date: 12/16/2019 EXAM: DUAL X-RAY ABSORPTIOMETRY (DXA) FOR BONE MINERAL DENSITY IMPRESSION: Your patient Alaylah Heatherington completed a BMD test on 12/16/2019 using the [place] (software version: 14.10) manufactured by [employer]. The following summarizes the results of our evaluation. Technologist:TNB PATIENT BIOGRAPHICAL: Name: Deona, Novitski Patient ID: [id number] Birth Date: [birth year] Height: 67.0 in. Gender: Female Exam Date: 12/16/2019 Weight: 272.0 lbs. Indications: Caucasian, Low Calcium Intake, Pre Menopausal, Tobacco User Fractures: Treatments: DENSITOMETRY RESULTS: Site         Region      Measured Date Measured Age Age Matched Z Score BMD         %Change vs. Previous Significant Change (*) DualFemur Neck Right 12/16/[age] 1.125 g/cm2 Left Forearm Radius 33% 12/16/2019 44.8 1.3 0.809 g/cm2 ASSESSMENT: The BMD measured at Femur Neck Right is 1.125 g/cm2 with a Z-score of 1.2. This patient is considered normal according to [facility]) criteria. The scan quality is good. Lumbar spine was excluded due to advanced degenerative changes. The Z-score is within the expected range for age. ISCD recommends using Z-scores for assessment of pre-menopausal women, men between 59 and 16 yoa, and children under 37 yoa. The diagnosis of osteoporosis in these patients should not be made on the basis of densitometric criteria alone. World Pharmacologist [facility]) criteria for post-menopausal, Caucasian Women: Normal:       T-score at or above -1 SD Osteopenia:   T-score between -1 and -2.5 SD Osteoporosis: T-score at or below -2.5 SD RECOMMENDATIONS: All patients should optimize calcium and vitamin D intake. FOLLOW-UP: People with diagnosed cases of osteoporosis or at high risk for fracture should have regular bone mineral density tests. For patients eligible for Medicare, routine testing is allowed once every 2 years. The testing frequency can be increased to one year for patients who have rapidly progressing disease, those who are receiving or discontinuing medical therapy to restore bone mass, or have additional risk factors. I have reviewed this report, and agreee with the above findings. [facility] Electronically Signed   By: Dorise Bullion III M.D   On: 12/16/2019 19:47    ASSESSMENT & PLAN:  Elevated M protein/IgA kappa MGUS: -Discovered during work-up by rheumatology for psoriatic arthritis. -Noted to have an abnormal SPEP with an M spike of 0.4.  IgA kappa. -All other lab work appears to be within normal limits -No personal or family history of blood  disorders or cancer per patient. -She does not have any B symptoms, new bone pains. No prior history of blood transfusions.  -Repeat lab work from 12/12/2019 shows normal CBC and CMP.  -LDH, UPEP with reflex, beta-2 microglobulin and serum free light chains all within normal limits.  -Still need to obtain skeletal survey to rule out bone lesions.   -Mild elevation in kappa free light chain-20.5 -She will not require bone marrow biopsy at this time. -We will continue to monitor lab work every 3 months.   Psoriatic arthritis: -Previously started on methotrexate and topical creams.  Unfortunately developed an anaphylactic reaction to methotrexate.  This was discontinued. -Recently prescribed Otezla.  Has not started.   Diabetes type 2: -On Metformin -A1c 6.1  HIV: -Followed by infectious disease every 6 months. -Well controlled-viral load undetectable per patient  Hypertension: -Stable on lisinopril, amlodipine and Lasix.  Social history: -No family history of blood disorders or cancer per patient. -Distant family relatives  had esophageal cancer. -Current everyday smoker for the past 25 years.  Approximately 1 pack/day.  Disposition: -Skeletal survey today -RTC in 3 months with repeat labs and review skeletal survey.    All questions were answered. The patient knows to call the clinic with any problems, questions or concerns.     Jacquelin Hawking, NP 01/12/20 8:09 AM

## 2020-01-24 ENCOUNTER — Telehealth: Payer: Self-pay

## 2020-01-24 NOTE — Telephone Encounter (Signed)
LMOVM for clarification on needing pen needles, do not have Ozempic on med list

## 2020-01-24 NOTE — Telephone Encounter (Signed)
  Prescription Request  01/24/2020  What is the name of the medication or equipment? Needles Only to Pen (Ozempic)  Have you contacted your pharmacy to request a refill? (if applicable) yes  Which pharmacy would you like this sent to? [facility]   Patient notified that their request is being sent to the clinical staff for review and that they should receive a response within 2 business days.   Lenna Gilford' pt  Please call pt.

## 2020-02-23 DIAGNOSIS — B20 Human immunodeficiency virus [HIV] disease: Secondary | ICD-10-CM | POA: Diagnosis not present

## 2020-02-29 ENCOUNTER — Other Ambulatory Visit: Payer: Self-pay | Admitting: Family

## 2020-02-29 DIAGNOSIS — E1142 Type 2 diabetes mellitus with diabetic polyneuropathy: Secondary | ICD-10-CM

## 2020-03-15 ENCOUNTER — Other Ambulatory Visit: Payer: Self-pay

## 2020-03-15 ENCOUNTER — Ambulatory Visit: Payer: Medicaid Other | Admitting: Family

## 2020-03-15 ENCOUNTER — Encounter: Payer: Self-pay | Admitting: Family

## 2020-03-15 VITALS — BP 111/75 | HR 92 | Temp 97.6°F | Ht 67.0 in | Wt 287.4 lb

## 2020-03-15 DIAGNOSIS — L409 Psoriasis, unspecified: Secondary | ICD-10-CM

## 2020-03-15 DIAGNOSIS — E1169 Type 2 diabetes mellitus with other specified complication: Secondary | ICD-10-CM

## 2020-03-15 DIAGNOSIS — E1142 Type 2 diabetes mellitus with diabetic polyneuropathy: Secondary | ICD-10-CM | POA: Diagnosis not present

## 2020-03-15 DIAGNOSIS — E1159 Type 2 diabetes mellitus with other circulatory complications: Secondary | ICD-10-CM

## 2020-03-15 DIAGNOSIS — Z6841 Body Mass Index (BMI) 40.0 and over, adult: Secondary | ICD-10-CM

## 2020-03-15 DIAGNOSIS — K219 Gastro-esophageal reflux disease without esophagitis: Secondary | ICD-10-CM

## 2020-03-15 DIAGNOSIS — L4050 Arthropathic psoriasis, unspecified: Secondary | ICD-10-CM

## 2020-03-15 DIAGNOSIS — F331 Major depressive disorder, recurrent, moderate: Secondary | ICD-10-CM

## 2020-03-15 DIAGNOSIS — E559 Vitamin D deficiency, unspecified: Secondary | ICD-10-CM

## 2020-03-15 DIAGNOSIS — F411 Generalized anxiety disorder: Secondary | ICD-10-CM

## 2020-03-15 DIAGNOSIS — E785 Hyperlipidemia, unspecified: Secondary | ICD-10-CM

## 2020-03-15 DIAGNOSIS — F17200 Nicotine dependence, unspecified, uncomplicated: Secondary | ICD-10-CM

## 2020-03-15 DIAGNOSIS — Z716 Tobacco abuse counseling: Secondary | ICD-10-CM | POA: Diagnosis not present

## 2020-03-15 DIAGNOSIS — I152 Hypertension secondary to endocrine disorders: Secondary | ICD-10-CM

## 2020-03-15 DIAGNOSIS — B20 Human immunodeficiency virus [HIV] disease: Secondary | ICD-10-CM

## 2020-03-15 LAB — CMP14+EGFR
ALT: 8 IU/L (ref 0–32)
AST: 8 IU/L (ref 0–40)
Albumin/Globulin Ratio: 1.5 (ref 1.2–2.2)
Albumin: 4.1 g/dL (ref 3.8–4.8)
Alkaline Phosphatase: 77 IU/L (ref 44–121)
BUN/Creatinine Ratio: 18 (ref 9–23)
BUN: 13 mg/dL (ref 6–24)
Bilirubin Total: 0.3 mg/dL (ref 0.0–1.2)
CO2: 21 mmol/L (ref 20–29)
Calcium: 9.1 mg/dL (ref 8.7–10.2)
Chloride: 108 mmol/L — ABNORMAL HIGH (ref 96–106)
Creatinine, Ser: 0.72 mg/dL (ref 0.57–1.00)
Globulin, Total: 2.7 g/dL (ref 1.5–4.5)
Glucose: 99 mg/dL (ref 65–99)
Potassium: 4.3 mmol/L (ref 3.5–5.2)
Sodium: 147 mmol/L — ABNORMAL HIGH (ref 134–144)
Total Protein: 6.8 g/dL (ref 6.0–8.5)
eGFR: 105 mL/min/{1.73_m2} (ref >59)

## 2020-03-15 LAB — CBC WITH DIFFERENTIAL/PLATELET
Basophils Absolute: 0.1 10*3/uL (ref 0.0–0.2)
Basos: 1 %
EOS (ABSOLUTE): 0.1 10*3/uL (ref 0.0–0.4)
Eos: 1 %
Hematocrit: 40.9 % (ref 34.0–46.6)
Hemoglobin: 13.6 g/dL (ref 11.1–15.9)
Immature Grans (Abs): 0 10*3/uL (ref 0.0–0.1)
Immature Granulocytes: 0 %
Lymphocytes Absolute: 2.9 10*3/uL (ref 0.7–3.1)
Lymphs: 32 %
MCH: 32.2 pg (ref 26.6–33.0)
MCHC: 33.3 g/dL (ref 31.5–35.7)
MCV: 97 fL (ref 79–97)
Monocytes Absolute: 0.7 10*3/uL (ref 0.1–0.9)
Monocytes: 7 %
Neutrophils Absolute: 5.2 10*3/uL (ref 1.4–7.0)
Neutrophils: 59 %
Platelets: 380 10*3/uL (ref 150–450)
RBC: 4.22 x10E6/uL (ref 3.77–5.28)
RDW: 12.5 % (ref 11.7–15.4)
WBC: 8.9 10*3/uL (ref 3.4–10.8)

## 2020-03-15 LAB — BAYER DCA HB A1C WAIVED: HB A1C (BAYER DCA - WAIVED): 5.5 % (ref <7.0)

## 2020-03-15 LAB — LIPID PANEL
Chol/HDL Ratio: 3.3 ratio (ref 0.0–4.4)
Cholesterol, Total: 157 mg/dL (ref 100–199)
HDL: 47 mg/dL (ref >39)
LDL Chol Calc (NIH): 97 mg/dL (ref 0–99)
Triglycerides: 68 mg/dL (ref 0–149)
VLDL Cholesterol Cal: 13 mg/dL (ref 5–40)

## 2020-03-15 MED ORDER — METFORMIN HCL ER 500 MG PO TB24: 500.0000 mg | ORAL_TABLET | Freq: Every day | ORAL | 1 refills | Status: DC

## 2020-03-15 MED ORDER — NICOTINE 21 MG/24HR TD PT24: 21.0000 mg | MEDICATED_PATCH | Freq: Every day | TRANSDERMAL | 0 refills | Status: DC

## 2020-03-15 MED ORDER — FLUTICASONE PROPIONATE 50 MCG/ACT NA SUSP: 2.0000 | Freq: Every day | NASAL | 6 refills | Status: DC

## 2020-03-15 MED ORDER — GABAPENTIN 600 MG PO TABS: 600.0000 mg | ORAL_TABLET | Freq: Three times a day (TID) | ORAL | 0 refills | Status: DC

## 2020-03-15 MED ORDER — LISINOPRIL 20 MG PO TABS: 20.0000 mg | ORAL_TABLET | Freq: Every day | ORAL | 3 refills | Status: DC

## 2020-03-15 MED ORDER — FUROSEMIDE 20 MG PO TABS: 20.0000 mg | ORAL_TABLET | Freq: Every day | ORAL | 1 refills | Status: DC

## 2020-03-15 MED ORDER — OMEPRAZOLE 20 MG PO CPDR: DELAYED_RELEASE_CAPSULE | ORAL | 4 refills | Status: DC

## 2020-03-15 MED ORDER — CLOBETASOL PROPIONATE 0.05 % EX CREA: 1.0000 "application " | TOPICAL_CREAM | Freq: Two times a day (BID) | CUTANEOUS | 2 refills | Status: DC

## 2020-03-15 MED ORDER — AMLODIPINE BESYLATE 10 MG PO TABS: 10.0000 mg | ORAL_TABLET | Freq: Every day | ORAL | 3 refills | Status: DC

## 2020-03-15 NOTE — Patient Instructions (Signed)

## 2020-03-15 NOTE — Progress Notes (Signed)
Subjective:    Patient ID: Jasmine Peck, female    DOB: Jun 08, 1975, 45 y.o.   MRN: [id number]  Chief Complaint  Patient presents with  . Diabetes  . Psoriasis    Needs clobetasol cream and kenalog ointment called in until she see der.   . Hypertension    Has not have meds this morning   Pt presents to the office today for chronic follow up. Pt is HIV positive andis followed by Infectious Diseaseevery 6 months. She is followed by Dermatologist for psoriasis.   She is reports she started smoking when she was 45 years old and been smoking for 30 years.   She is under a great deal of stress. She is caring for her autistic grandson who is very aggressive.  Diabetes She presents for her follow-up diabetic visit. She has type 2 diabetes mellitus. Hypoglycemia symptoms include nervousness/anxiousness. Pertinent negatives for diabetes include no blurred vision and no foot paresthesias. Symptoms are stable. Risk factors for coronary artery disease include dyslipidemia, diabetes mellitus, hypertension, sedentary lifestyle and post-menopausal. She is following a generally healthy diet. Her overall blood glucose range is 90-110 mg/dl. An ACE inhibitor/angiotensin II receptor blocker is being taken. Eye exam is current.  Psoriasis This is a chronic problem. The current episode started more than 1 year ago. The problem has been waxing and waning since onset. The affected locations include the left elbow and right elbow. Associated symptoms include arthritis.  Hypertension This is a chronic problem. The current episode started more than 1 year ago. The problem has been resolved since onset. The problem is controlled. Associated symptoms include anxiety and malaise/fatigue. Pertinent negatives include no blurred vision, peripheral edema or shortness of breath. Risk factors for coronary artery disease include dyslipidemia, diabetes mellitus, obesity and sedentary lifestyle. The current treatment  provides moderate improvement.  Hyperlipidemia This is a chronic problem. The current episode started more than 1 year ago. Exacerbating diseases include obesity. Pertinent negatives include no shortness of breath. Current antihyperlipidemic treatment includes statins. The current treatment provides moderate improvement of lipids. Risk factors for coronary artery disease include dyslipidemia, diabetes mellitus, hypertension and a sedentary lifestyle.  Gastroesophageal Reflux She complains of belching, heartburn and a hoarse voice. This is a chronic problem. The current episode started more than 1 year ago. The problem occurs occasionally. The problem has been waxing and waning. Risk factors include obesity. She has tried a PPI for the symptoms. The treatment provided moderate relief.  Arthritis Presents for follow-up visit. She complains of pain and stiffness. The symptoms have been stable. Affected locations include the left shoulder. Her pain is at a severity of 0/10. Her past medical history is significant for psoriasis.  Anxiety Presents for follow-up visit. Symptoms include depressed mood, irritability and nervous/anxious behavior. Patient reports no shortness of breath. The severity of symptoms is moderate.    Depression        This is a chronic problem.  The current episode started more than 1 year ago.   The onset quality is gradual.   The problem occurs intermittently.  Associated symptoms include irritable and sad.  Associated symptoms include no helplessness and no hopelessness.  Past medical history includes anxiety.   Nicotine Dependence Presents for follow-up visit. Symptoms include irritability. Her urge triggers include company of smokers. The symptoms have been stable. She smokes < 1/2 a pack of cigarettes per day.      Review of Systems  Constitutional: Positive for irritability and malaise/fatigue.  HENT:  Positive for hoarse voice.   Eyes: Negative for blurred vision.   Respiratory: Negative for shortness of breath.   Gastrointestinal: Positive for heartburn.  Musculoskeletal: Positive for arthritis and stiffness.  Psychiatric/Behavioral: Positive for depression. The patient is nervous/anxious.   All other systems reviewed and are negative.      Objective:   Physical Exam Vitals reviewed.  Constitutional:      General: She is irritable. She is not in acute distress.    Appearance: She is well-developed and well-nourished. She is obese.  HENT:     Head: Normocephalic and atraumatic.     Right Ear: Tympanic membrane normal.     Left Ear: Tympanic membrane normal.     Mouth/Throat:     Mouth: Oropharynx is clear and moist.  Eyes:     Pupils: Pupils are equal, round, and reactive to light.  Neck:     Thyroid: No thyromegaly.  Cardiovascular:     Rate and Rhythm: Normal rate and regular rhythm.     Pulses: Intact distal pulses.     Heart sounds: Normal heart sounds. No murmur heard.   Pulmonary:     Effort: Pulmonary effort is normal. No respiratory distress.     Breath sounds: Normal breath sounds. No wheezing.  Abdominal:     General: Bowel sounds are normal. There is no distension.     Palpations: Abdomen is soft.     Tenderness: There is no abdominal tenderness.  Musculoskeletal:        General: No tenderness or edema. Normal range of motion.     Cervical back: Normal range of motion and neck supple.  Skin:    General: Skin is warm and dry.  Neurological:     Mental Status: She is alert and oriented to person, place, and time.     Cranial Nerves: No cranial nerve deficit.     Deep Tendon Reflexes: Reflexes are normal and symmetric.  Psychiatric:        Mood and Affect: Mood and affect normal.        Behavior: Behavior normal.        Thought Content: Thought content normal.        Judgment: Judgment normal.       BP 111/75   Pulse 92   Temp 97.6 F (36.4 C) (Temporal)   Ht 5' 7"  (1.702 m)   Wt 287 lb 6.4 oz (130.4 kg)    BMI 45.01 kg/m      Assessment & Plan:  Kynzley Dowson comes in today with chief complaint of Diabetes, Psoriasis (Needs clobetasol cream and kenalog ointment called in until she see der. ), and Hypertension (Has not have meds this morning)   Diagnosis and orders addressed:  1. Current smoker  - nicotine (NICODERM CQ - DOSED IN MG/24 HOURS) 21 mg/24hr patch; Place 1 patch (21 mg total) onto the skin daily.  Dispense: 28 patch; Refill: 0 - CMP14+EGFR - CBC with Differential/Platelet  2. Encounter for smoking cessation counseling - nicotine (NICODERM CQ - DOSED IN MG/24 HOURS) 21 mg/24hr patch; Place 1 patch (21 mg total) onto the skin daily.  Dispense: 28 patch; Refill: 0 - CMP14+EGFR - CBC with Differential/Platelet  3. Diabetic peripheral neuropathy (HCC) - gabapentin (NEURONTIN) 600 MG tablet; Take 1 tablet (600 mg total) by mouth 3 (three) times daily.  Dispense: 90 tablet; Refill: 0 - CMP14+EGFR - CBC with Differential/Platelet - metFORMIN (GLUCOPHAGE XR) 500 MG 24 hr tablet; Take 1 tablet (500 mg  total) by mouth daily with breakfast.  Dispense: 90 tablet; Refill: 1  4. Gastroesophageal reflux disease, unspecified whether esophagitis present - omeprazole (PRILOSEC) 20 MG capsule; TAKE ONE CAPSULE BY MOUTH EVERY DAY, NEEDS APPOINTMENT FOR REFILLS  Dispense: 90 capsule; Refill: 4 - CMP14+EGFR - CBC with Differential/Platelet  5. Hypertension associated with diabetes ([place]) Will decrease lisinopril to 20 mg from 30 mg - amLODipine (NORVASC) 10 MG tablet; Take 1 tablet (10 mg total) by mouth daily.  Dispense: 90 tablet; Refill: 3 - lisinopril (ZESTRIL) 20 MG tablet; Take 1 tablet (20 mg total) by mouth daily.  Dispense: 90 tablet; Refill: 3 - CMP14+EGFR - CBC with Differential/Platelet  6. Type 2 diabetes mellitus with diabetic polyneuropathy, without long-term current use of insulin (HCC) Will change metformin to XR daily. - Bayer DCA Hb A1c Waived - CMP14+EGFR - CBC with  Differential/Platelet - metFORMIN (GLUCOPHAGE XR) 500 MG 24 hr tablet; Take 1 tablet (500 mg total) by mouth daily with breakfast.  Dispense: 90 tablet; Refill: 1  7. Hyperlipidemia associated with type 2 diabetes mellitus (HCC) - CMP14+EGFR - CBC with Differential/Platelet - Lipid panel  8. Psoriasis - CMP14+EGFR - CBC with Differential/Platelet  9. Psoriatic arthritis ([place]) - CMP14+EGFR - CBC with Differential/Platelet  10. GAD (generalized anxiety disorder) - CMP14+EGFR - CBC with Differential/Platelet  11. Moderate episode of recurrent major depressive disorder (HCC) - CMP14+EGFR - CBC with Differential/Platelet  12. Vitamin D deficiency - CMP14+EGFR - CBC with Differential/Platelet  13. Morbid obesity with BMI of 45.0-49.9, adult (HCC) - CMP14+EGFR - CBC with Differential/Platelet  14. Human immunodeficiency virus (HIV) disease ([place]) - CMP14+EGFR - CBC with Differential/Platelet   Labs pending Health Maintenance reviewed Diet and exercise encouraged  Follow up plan: 3 months    [name], FNP

## 2020-03-16 ENCOUNTER — Other Ambulatory Visit: Payer: Self-pay | Admitting: Family

## 2020-03-26 ENCOUNTER — Ambulatory Visit: Payer: Medicaid Other | Admitting: Nurse Practitioner

## 2020-03-26 ENCOUNTER — Encounter: Payer: Self-pay | Admitting: Nurse Practitioner

## 2020-03-26 VITALS — Temp 97.7°F

## 2020-03-26 DIAGNOSIS — J0110 Acute frontal sinusitis, unspecified: Secondary | ICD-10-CM | POA: Diagnosis not present

## 2020-03-26 MED ORDER — AMOXICILLIN-POT CLAVULANATE 875-125 MG PO TABS: 1.0000 | ORAL_TABLET | Freq: Two times a day (BID) | ORAL | 0 refills | Status: DC

## 2020-03-26 MED ORDER — DM-GUAIFENESIN ER 30-600 MG PO TB12: 1.0000 | ORAL_TABLET | Freq: Two times a day (BID) | ORAL | 0 refills | Status: DC

## 2020-03-26 NOTE — Assessment & Plan Note (Signed)
Subacute frontal sinusitis symptoms not well controlled in the last 7 days.  Patient is reporting cough with thick green phlegm, head pressure and headache.  She denies fever, chills, nausea or vomiting. Education provided to patient to increase hydration, Tylenol for headache, Augmentin, guaifenesin for cough and congestion.  Advised patient to continue Flonase.  Medication sent to pharmacy.

## 2020-03-26 NOTE — Patient Instructions (Signed)

## 2020-03-26 NOTE — Progress Notes (Signed)
   Virtual Visit via telephone Note Due to COVID-19 pandemic this visit was conducted virtually. This visit type was conducted due to national recommendations for restrictions regarding the COVID-19 Pandemic (e.g. social distancing, sheltering in place) in an effort to limit this patient's exposure and mitigate transmission in our community. All issues noted in this document were discussed and addressed.  A physical exam was not performed with this format.  I connected with Jasmine Peck on 03/26/20 at 08:21 by telephone and verified that I am speaking with the correct person using two identifiers. Jasmine Peck is currently located in her car is during this visit. The provider, Ivy Lynn, NP is located in their office at time of visit.  I discussed the limitations, risks, security and privacy concerns of performing an evaluation and management service by telephone and the availability of in person appointments. I also discussed with the patient that there may be a patient responsible charge related to this service. The patient expressed understanding and agreed to proceed.   History and Present Illness:  Sinusitis This is a recurrent problem. The current episode started in the past 7 days. The problem is unchanged. There has been no fever. The pain is moderate. Associated symptoms include congestion, coughing, headaches, sinus pressure and sneezing. Pertinent negatives include no chills, ear pain, hoarse voice or neck pain. Past treatments include spray decongestants. The treatment provided no relief.      Review of Systems  Constitutional: Negative for chills.  HENT: Positive for congestion, sinus pressure and sneezing. Negative for ear pain and hoarse voice.   Respiratory: Positive for cough.   Musculoskeletal: Negative for neck pain.  Neurological: Positive for headaches.  All other systems reviewed and are negative.    Observations/Objective: Televisit-patient did not  sound to be in distress.  Assessment and Plan: Subacute frontal sinusitis Subacute frontal sinusitis symptoms not well controlled in the last 7 days.  Patient is reporting cough with thick green phlegm, head pressure and headache.  She denies fever, chills, nausea or vomiting. Education provided to patient to increase hydration, Tylenol for headache, Augmentin, guaifenesin for cough and congestion.  Advised patient to continue Flonase.  Medication sent to pharmacy.   Follow Up Instructions: Follow-up with worsening or unresolved symptoms.   I discussed the assessment and treatment plan with the patient. The patient was provided an opportunity to ask questions and all were answered. The patient agreed with the plan and demonstrated an understanding of the instructions.   The patient was advised to call back or seek an in-person evaluation if the symptoms worsen or if the condition fails to improve as anticipated.  The above assessment and management plan was discussed with the patient. The patient verbalized understanding of and has agreed to the management plan. Patient is aware to call the clinic if symptoms persist or worsen. Patient is aware when to return to the clinic for a follow-up visit. Patient educated on when it is appropriate to go to the emergency department.   Time call ended:  08:30 AM  I provided 9 minutes of non-face-to-face time during this encounter.    Ivy Lynn, NP

## 2020-04-16 ENCOUNTER — Other Ambulatory Visit (HOSPITAL_COMMUNITY): Payer: Self-pay

## 2020-04-16 DIAGNOSIS — D472 Monoclonal gammopathy: Secondary | ICD-10-CM

## 2020-04-17 ENCOUNTER — Inpatient Hospital Stay (HOSPITAL_COMMUNITY): Payer: Medicaid Other | Attending: Hematology

## 2020-04-17 ENCOUNTER — Ambulatory Visit (HOSPITAL_COMMUNITY): Payer: Medicaid Other | Admitting: Hematology

## 2020-04-17 ENCOUNTER — Other Ambulatory Visit: Payer: Self-pay

## 2020-04-17 DIAGNOSIS — Z79899 Other long term (current) drug therapy: Secondary | ICD-10-CM | POA: Insufficient documentation

## 2020-04-17 DIAGNOSIS — D472 Monoclonal gammopathy: Secondary | ICD-10-CM | POA: Insufficient documentation

## 2020-04-17 DIAGNOSIS — F17200 Nicotine dependence, unspecified, uncomplicated: Secondary | ICD-10-CM | POA: Diagnosis not present

## 2020-04-17 DIAGNOSIS — L4050 Arthropathic psoriasis, unspecified: Secondary | ICD-10-CM | POA: Diagnosis not present

## 2020-04-17 DIAGNOSIS — Z21 Asymptomatic human immunodeficiency virus [HIV] infection status: Secondary | ICD-10-CM | POA: Diagnosis not present

## 2020-04-17 LAB — CBC WITH DIFFERENTIAL/PLATELET
Abs Immature Granulocytes: 0.02 10*3/uL (ref 0.00–0.07)
Basophils Absolute: 0.1 10*3/uL (ref 0.0–0.1)
Basophils Relative: 1 %
Eosinophils Absolute: 0.1 10*3/uL (ref 0.0–0.5)
Eosinophils Relative: 2 %
HCT: 42.2 % (ref 36.0–46.0)
Hemoglobin: 13.2 g/dL (ref 12.0–15.0)
Immature Granulocytes: 0 %
Lymphocytes Relative: 37 %
Lymphs Abs: 3.1 10*3/uL (ref 0.7–4.0)
MCH: 32 pg (ref 26.0–34.0)
MCHC: 31.3 g/dL (ref 30.0–36.0)
MCV: 102.4 fL — ABNORMAL HIGH (ref 80.0–100.0)
Monocytes Absolute: 0.7 10*3/uL (ref 0.1–1.0)
Monocytes Relative: 8 %
Neutro Abs: 4.3 10*3/uL (ref 1.7–7.7)
Neutrophils Relative %: 52 %
Platelets: 348 10*3/uL (ref 150–400)
RBC: 4.12 MIL/uL (ref 3.87–5.11)
RDW: 13.2 % (ref 11.5–15.5)
WBC: 8.3 10*3/uL (ref 4.0–10.5)
nRBC: 0 % (ref 0.0–0.2)

## 2020-04-17 LAB — COMPREHENSIVE METABOLIC PANEL
ALT: 10 U/L (ref 0–44)
AST: 13 U/L — ABNORMAL LOW (ref 15–41)
Albumin: 3.7 g/dL (ref 3.5–5.0)
Alkaline Phosphatase: 58 U/L (ref 38–126)
Anion gap: 9 (ref 5–15)
BUN: 11 mg/dL (ref 6–20)
CO2: 26 mmol/L (ref 22–32)
Calcium: 8.7 mg/dL — ABNORMAL LOW (ref 8.9–10.3)
Chloride: 105 mmol/L (ref 98–111)
Creatinine, Ser: 0.71 mg/dL (ref 0.44–1.00)
GFR, Estimated: >60 mL/min (ref >60)
Glucose, Bld: 93 mg/dL (ref 70–99)
Potassium: 4.2 mmol/L (ref 3.5–5.1)
Sodium: 140 mmol/L (ref 135–145)
Total Bilirubin: 0.6 mg/dL (ref 0.3–1.2)
Total Protein: 6.8 g/dL (ref 6.5–8.1)

## 2020-04-17 LAB — LACTATE DEHYDROGENASE: LDH: 114 U/L (ref 98–192)

## 2020-04-18 LAB — KAPPA/LAMBDA LIGHT CHAINS
Kappa free light chain: 15.7 mg/L (ref 3.3–19.4)
Kappa, lambda light chain ratio: 1.39 (ref 0.26–1.65)
Lambda free light chains: 11.3 mg/L (ref 5.7–26.3)

## 2020-04-24 ENCOUNTER — Inpatient Hospital Stay (HOSPITAL_BASED_OUTPATIENT_CLINIC_OR_DEPARTMENT_OTHER): Payer: Medicaid Other | Admitting: Hematology

## 2020-04-24 ENCOUNTER — Other Ambulatory Visit: Payer: Self-pay

## 2020-04-24 VITALS — BP 160/90 | HR 77 | Temp 96.9°F | Resp 20 | Wt 298.6 lb

## 2020-04-24 DIAGNOSIS — D472 Monoclonal gammopathy: Secondary | ICD-10-CM

## 2020-04-24 NOTE — Patient Instructions (Addendum)
[place] at [facility] Discharge Instructions  You were seen today by Dr. Delton Coombes. He went over your recent results. 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 Purchase calcium over the counter and take 1 tablet of 600 mg daily. Your next appointment will be with the physician assistant in 6 months for labs and follow up.   Thank you for choosing [place] at [facility] to provide your oncology and hematology care.  To afford each patient quality time with our provider, please arrive at least 15 minutes before your scheduled appointment time.   If you have a lab appointment with the [facility] please come in thru the Main Entrance and check in at the main information desk  You need to re-schedule your appointment should you arrive 10 or more minutes late.  We strive to give you quality time with our providers, and arriving late affects you and other patients whose appointments are after yours.  Also, if you no show three or more times for appointments you may be dismissed from the clinic at the providers discretion.     Again, thank you for choosing [facility].  Our hope is that these requests will decrease the amount of time that you wait before being seen by our physicians.       _____________________________________________________________  Should you have questions after your visit to [facility], please contact our office at [phone] between the hours of 8:00 a.m. and 4:30 p.m.  Voicemails left after 4:00 p.m. will not be returned until the following business day.  For prescription refill requests, have your pharmacy contact our office and allow 72 hours.    Cancer Center Support Programs:   > Cancer Support Group  2nd Tuesday of the month 1pm-2pm, Journey Room

## 2020-04-24 NOTE — Progress Notes (Signed)
[address], [place] [address]   CLINIC:  Medical Oncology/Hematology  PCP:  Sharion Balloon, [address] / [place] [address]  [phone]  REASON FOR VISIT:  Follow-up for IgA kappa MGUS  PRIOR THERAPY: None  CURRENT THERAPY: Surveillance  INTERVAL HISTORY:  Jasmine Peck, a 45 y.o. female, returns for routine follow-up for her IgA kappa MGUS. Jasmine Peck was last seen by Faythe Casa, NP, on 01/12/2020.  Today Jasmine Peck reports feeling okay. Jasmine Peck continues having occasional pain in her bilateral sternoclavicular joints and aching in small joints of hands, especially when Jasmine Peck wakes up from sleep. Jasmine Peck is not taking calcium supplements. Jasmine Peck was discontinued from metformin by her PCP and has weight has been steadily increasing, so Jasmine Peck will join a gym.   REVIEW OF SYSTEMS:  Review of Systems  Constitutional: Positive for appetite change (75%) and fatigue (50%).  Musculoskeletal: Positive for arthralgias (arthritis in sternoclavicular joints).  All other systems reviewed and are negative.   PAST MEDICAL/SURGICAL HISTORY:  Past Medical History:  Diagnosis Date  . Anxiety   . Depression   . Diabetes mellitus without complication ([place])   . GERD (gastroesophageal reflux disease)   . HIV infection ([place])   . Hypertension   . Psoriasis    per patient    Past Surgical History:  Procedure Laterality Date  . CHOLECYSTECTOMY    . DILATION AND CURETTAGE OF UTERUS    . MOUTH SURGERY     all teeth have been removed     SOCIAL HISTORY:  Social History   Socioeconomic History  . Marital status: Divorced    Spouse name: Not on file  . Number of children: Not on file  . Years of education: Not on file  . Highest education level: Not on file  Occupational History  . Not on file  Tobacco Use  . Smoking status: Current Every Day Smoker    Packs/day: 0.50  . Smokeless tobacco: Never Used  Vaping Use  . Vaping Use: Never  used  Substance and Sexual Activity  . Alcohol use: No    Alcohol/week: 0.0 standard drinks  . Drug use: No  . Sexual activity: Never  Other Topics Concern  . Not on file  Social History Narrative  . Not on file   Social Determinants of Health   Financial Resource Strain: Low Risk   . Difficulty of Paying Living Expenses: Not hard at all  Food Insecurity: No Food Insecurity  . Worried About Charity fundraiser in the Last Year: Never true  . Ran Out of Food in the Last Year: Never true  Transportation Needs: No Transportation Needs  . Lack of Transportation (Medical): No  . Lack of Transportation (Non-Medical): No  Physical Activity: Inactive  . Days of Exercise per Week: 0 days  . Minutes of Exercise per Session: 0 min  Stress: Stress Concern Present  . Feeling of Stress : Rather much  Social Connections: Moderately Isolated  . Frequency of Communication with Friends and Family: More than three times a week  . Frequency of Social Gatherings with Friends and Family: Three times a week  . Attends Religious Services: Never  . Active Member of Clubs or Organizations: Yes  . Attends Archivist Meetings: Never  . Marital Status: Never married  Intimate Partner Violence: Not At Risk  . Fear of Current or Ex-Partner: No  . Emotionally Abused: No  . Physically Abused:  No  . Sexually Abused: No    FAMILY HISTORY:  Family History  Problem Relation Age of Onset  . Heart disease Mother   . Diabetes Mother   . Hypertension Mother   . Early death Father   . Diabetes Father   . Hypertension Father   . Diabetes Brother   . Hypertension Brother   . Hyperlipidemia Brother   . Heart disease Maternal Grandmother   . Diabetes Maternal Grandmother   . Diabetes Paternal Grandfather   . Heart disease Paternal Grandfather   . GER disease Daughter   . Healthy Daughter   . Healthy Daughter   . Healthy Daughter   . Asthma Daughter     CURRENT MEDICATIONS:  Current  Outpatient Medications  Medication Sig Dispense Refill  . abacavir-dolutegravir-lamiVUDine (TRIUMEQ) 600-50-300 MG tablet TAKE 1 TABLET BY MOUTH DAILY.    Marland Kitchen amLODipine (NORVASC) 10 MG tablet Take 1 tablet (10 mg total) by mouth daily. 90 tablet 3  . cetirizine (ZYRTEC) 10 MG tablet Take 1 tablet (10 mg total) by mouth daily. 90 tablet 4  . clobetasol cream (TEMOVATE) 5.28 % Apply 1 application topically 2 (two) times daily. 60 g 2  . fluticasone (FLONASE) 50 MCG/ACT nasal spray Place 2 sprays into both nostrils daily. 16 g 6  . furosemide (LASIX) 20 MG tablet Take 1 tablet (20 mg total) by mouth daily. 90 tablet 1  . gabapentin (NEURONTIN) 600 MG tablet Take 1 tablet (600 mg total) by mouth 3 (three) times daily. 90 tablet 0  . lisinopril (ZESTRIL) 20 MG tablet Take 1 tablet (20 mg total) by mouth daily. 90 tablet 3  . omeprazole (PRILOSEC) 20 MG capsule TAKE ONE CAPSULE BY MOUTH EVERY DAY, NEEDS APPOINTMENT FOR REFILLS 90 capsule 4  . OVER THE COUNTER MEDICATION      No current facility-administered medications for this visit.    ALLERGIES:  Allergies  Allergen Reactions  . Clindamycin/Lincomycin     rash  . Erythromycin Other (See Comments)    Abdominal pain    PHYSICAL EXAM:  Performance status (ECOG): 1 - Symptomatic but completely ambulatory  Vitals:   04/24/20 1134  BP: (!) 160/90  Pulse: 77  Resp: 20  Temp: (!) 96.9 F (36.1 C)  SpO2: 96%   Wt Readings from Last 3 Encounters:  04/24/20 298 lb 9.6 oz (135.4 kg)  03/15/20 287 lb 6.4 oz (130.4 kg)  01/12/20 271 lb 9.6 oz (123.2 kg)   Physical Exam Vitals reviewed.  Constitutional:      Appearance: Normal appearance. Jasmine Peck is obese.  Cardiovascular:     Rate and Rhythm: Normal rate and regular rhythm.     Pulses: Normal pulses.     Heart sounds: Normal heart sounds.  Pulmonary:     Effort: Pulmonary effort is normal.     Breath sounds: Normal breath sounds.  Musculoskeletal:     Right lower leg: No edema.      Left lower leg: No edema.  Neurological:     General: No focal deficit present.     Mental Status: Jasmine Peck is alert and oriented to person, place, and time.  Psychiatric:        Mood and Affect: Mood normal.        Behavior: Behavior normal.     LABORATORY DATA:  I have reviewed the labs as listed.  CBC Latest Ref Rng & Units 04/17/2020 03/15/2020 12/12/2019  WBC 4.0 - 10.5 K/uL 8.3 8.9 9.7  Hemoglobin 12.0 -  15.0 g/dL 13.2 13.6 14.0  Hematocrit 36.0 - 46.0 % 42.2 40.9 43.3  Platelets 150 - 400 K/uL 348 380 371   CMP Latest Ref Rng & Units 04/17/2020 03/15/2020 12/12/2019  Glucose 70 - 99 mg/dL 93 99 99  BUN 6 - 20 mg/dL 11 13 14   Creatinine 0.44 - 1.00 mg/dL 0.71 0.72 0.69  Sodium 135 - 145 mmol/L 140 147(H) 137  Potassium 3.5 - 5.1 mmol/L 4.2 4.3 4.0  Chloride 98 - 111 mmol/L 105 108(H) 104  CO2 22 - 32 mmol/L 26 21 23   Calcium 8.9 - 10.3 mg/dL 8.7(L) 9.1 8.9  Total Protein 6.5 - 8.1 g/dL 6.8 6.8 7.4  Total Bilirubin 0.3 - 1.2 mg/dL 0.6 0.3 0.3  Alkaline Phos 38 - 126 U/L 58 77 62  AST 15 - 41 U/L 13(L) 8 13(L)  ALT 0 - 44 U/L 10 8 10       Component Value Date/Time   RBC 4.12 04/17/2020 1002   MCV 102.4 (H) 04/17/2020 1002   MCV 97 03/15/2020 1107   MCH 32.0 04/17/2020 1002   MCHC 31.3 04/17/2020 1002   RDW 13.2 04/17/2020 1002   RDW 12.5 03/15/2020 1107   LYMPHSABS 3.1 04/17/2020 1002   LYMPHSABS 2.9 03/15/2020 1107   MONOABS 0.7 04/17/2020 1002   EOSABS 0.1 04/17/2020 1002   EOSABS 0.1 03/15/2020 1107   BASOSABS 0.1 04/17/2020 1002   BASOSABS 0.1 03/15/2020 1107   Lab Results  Component Value Date   LDH 114 04/17/2020   LDH 98 12/12/2019   Lab Results  Component Value Date   ALBUMINELP 4.2 11/07/2019   A1GS 0.4 (H) 11/07/2019   A2GS 1.0 (H) 11/07/2019   BETS 0.5 11/07/2019   BETA2SER 0.3 11/07/2019   GAMS 0.9 11/07/2019   SPEI  11/07/2019     Comment:     . Evaluation reveals a restricted band (M-spike)  migrating in the gamma globulin region. If  not already requested, Immunofixation should be considered.      Lab Results  Component Value Date   KPAFRELGTCHN 15.7 04/17/2020   LAMBDASER 11.3 04/17/2020   KAPLAMBRATIO 1.39 04/17/2020    DIAGNOSTIC IMAGING:  I have independently reviewed the scans and discussed with the patient. No results found.   ASSESSMENT:  1.  IgA kappa MGUS: -Discovered during work-up by rheumatology for psoriatic arthritis. -Noted to have an abnormal SPEP with an M spike of 0.4.  IgA kappa. -All other lab work appears to be within normal limits -No personal or family history of blood disorders or cancer per patient. -Jasmine Peck does not have any B symptoms, new bone pains. No prior history of blood transfusions.  -Repeat lab work from 12/12/2019 shows normal CBC and CMP. -LDH, UPEP with reflex, beta-2 microglobulin and serum free light chains all within normal limits. -Still need to obtain skeletal survey to rule out bone lesions.  -Mild elevation in kappa free light chain-20.5 -Metastatic skeletal survey on 01/12/2020 with no lytic lesions.  2.  Psoriatic arthritis: -Previously started on methotrexate and topical creams.  Unfortunately developed an anaphylactic reaction to methotrexate.  This was discontinued. -Recently prescribed Otezla.  Has not started.   3.  HIV: -Followed by infectious disease every 6 months. -Well controlled-viral load undetectable per patient.  4.  Social history: -No family history of blood disorders or cancer per patient. -Distant family relatives had esophageal cancer. -Current everyday smoker for the past 25 years.  Approximately 1 pack/day.   PLAN:  1.  IgA kappa MGUS: -We have reviewed her labs from 04/17/2020.  Creatinine and calcium were normal.  CBC shows normal hemoglobin.  Free light chain ratio was normal. -We discussed the normal pathophysiology of MGUS and rare chance of transformation to myeloma. -Jasmine Peck does not have any "crab" features at this time.  Occasional  pains at the sternoclavicular joint are stable, thought to be from psoriasis. -RTC 6 months with repeat SPEP, free light chains along with routine labs.  We will repeat skeletal survey in a year.  Orders placed this encounter:  Orders Placed This Encounter  Procedures  . Protein electrophoresis, serum  . Immunofixation electrophoresis  . Kappa/lambda light chains  . Lactate dehydrogenase  . CBC with Differential/Platelet  . Comprehensive metabolic panel     Derek Jack, MD [place] [phone]   I, Milinda Antis, am acting as a scribe for Dr. Sanda Linger.  I, Derek Jack MD, have reviewed the above documentation for accuracy and completeness, and I agree with the above.

## 2020-05-02 ENCOUNTER — Other Ambulatory Visit: Payer: Self-pay | Admitting: Family

## 2020-05-02 DIAGNOSIS — E1142 Type 2 diabetes mellitus with diabetic polyneuropathy: Secondary | ICD-10-CM

## 2020-05-03 ENCOUNTER — Encounter: Payer: Self-pay | Admitting: Family

## 2020-05-03 ENCOUNTER — Ambulatory Visit: Payer: Medicaid Other | Admitting: Family

## 2020-05-03 ENCOUNTER — Other Ambulatory Visit: Payer: Self-pay

## 2020-05-03 VITALS — BP 149/89 | HR 79 | Temp 97.3°F | Ht 67.0 in | Wt 305.2 lb

## 2020-05-03 DIAGNOSIS — F17200 Nicotine dependence, unspecified, uncomplicated: Secondary | ICD-10-CM

## 2020-05-03 DIAGNOSIS — E1142 Type 2 diabetes mellitus with diabetic polyneuropathy: Secondary | ICD-10-CM | POA: Diagnosis not present

## 2020-05-03 DIAGNOSIS — Z713 Dietary counseling and surveillance: Secondary | ICD-10-CM

## 2020-05-03 DIAGNOSIS — B20 Human immunodeficiency virus [HIV] disease: Secondary | ICD-10-CM | POA: Diagnosis not present

## 2020-05-03 DIAGNOSIS — E1159 Type 2 diabetes mellitus with other circulatory complications: Secondary | ICD-10-CM

## 2020-05-03 DIAGNOSIS — Z6841 Body Mass Index (BMI) 40.0 and over, adult: Secondary | ICD-10-CM

## 2020-05-03 DIAGNOSIS — I152 Hypertension secondary to endocrine disorders: Secondary | ICD-10-CM

## 2020-05-03 MED ORDER — OZEMPIC (1 MG/DOSE) 4 MG/3ML ~~LOC~~ SOPN: 1.0000 mg | PEN_INJECTOR | SUBCUTANEOUS | 6 refills | Status: DC

## 2020-05-03 MED ORDER — OZEMPIC (0.25 OR 0.5 MG/DOSE) 2 MG/1.5ML ~~LOC~~ SOPN: PEN_INJECTOR | SUBCUTANEOUS | 0 refills | Status: AC

## 2020-05-03 NOTE — Patient Instructions (Signed)
Diabetes Mellitus and Nutrition, Adult When you have diabetes, or diabetes mellitus, it is very important to have healthy eating habits because your blood sugar (glucose) levels are greatly affected by what you eat and drink. Eating healthy foods in the right amounts, at about the same times every day, can help you:  Control your blood glucose.  Lower your risk of heart disease.  Improve your blood pressure.  Reach or maintain a healthy weight. What can affect my meal plan? Every person with diabetes is different, and each person has different needs for a meal plan. Your health care provider may recommend that you work with a dietitian to make a meal plan that is best for you. Your meal plan may vary depending on factors such as:  The calories you need.  The medicines you take.  Your weight.  Your blood glucose, blood pressure, and cholesterol levels.  Your activity level.  Other health conditions you have, such as heart or kidney disease. How do carbohydrates affect me? Carbohydrates, also called carbs, affect your blood glucose level more than any other type of food. Eating carbs naturally raises the amount of glucose in your blood. Carb counting is a method for keeping track of how many carbs you eat. Counting carbs is important to keep your blood glucose at a healthy level, especially if you use insulin or take certain oral diabetes medicines. It is important to know how many carbs you can safely have in each meal. This is different for every person. Your dietitian can help you calculate how many carbs you should have at each meal and for each snack. How does alcohol affect me? Alcohol can cause a sudden decrease in blood glucose (hypoglycemia), especially if you use insulin or take certain oral diabetes medicines. Hypoglycemia can be a life-threatening condition. Symptoms of hypoglycemia, such as sleepiness, dizziness, and confusion, are similar to symptoms of having too much  alcohol.  Do not drink alcohol if: ? Your health care provider tells you not to drink. ? You are pregnant, may be pregnant, or are planning to become pregnant.  If you drink alcohol: ? Do not drink on an empty stomach. ? Limit how much you use to:  0-1 drink a day for women.  0-2 drinks a day for men. ? Be aware of how much alcohol is in your drink. In the [place] one drink equals one 12 oz bottle of beer (355 mL), one 5 oz glass of wine (148 mL), or one 1 oz glass of hard liquor (44 mL). ? Keep yourself hydrated with water, diet soda, or unsweetened iced tea.  Keep in mind that regular soda, juice, and other mixers may contain a lot of sugar and must be counted as carbs. What are tips for following this plan? Reading food labels  Start by checking the serving size on the "Nutrition Facts" label of packaged foods and drinks. The amount of calories, carbs, fats, and other nutrients listed on the label is based on one serving of the item. Many items contain more than one serving per package.  Check the total grams (g) of carbs in one serving. You can calculate the number of servings of carbs in one serving by dividing the total carbs by 15. For example, if a food has 30 g of total carbs per serving, it would be equal to 2 servings of carbs.  Check the number of grams (g) of saturated fats and trans fats in one serving. Choose foods that have   a low amount or none of these fats.  Check the number of milligrams (mg) of salt (sodium) in one serving. Most people should limit total sodium intake to less than 2,300 mg per day.  Always check the nutrition information of foods labeled as "low-fat" or "nonfat." These foods may be higher in added sugar or refined carbs and should be avoided.  Talk to your dietitian to identify your daily goals for nutrients listed on the label. Shopping  Avoid buying canned, pre-made, or processed foods. These foods tend to be high in fat, sodium, and added  sugar.  Shop around the outside edge of the grocery store. This is where you will most often find fresh fruits and vegetables, bulk grains, fresh meats, and fresh dairy. Cooking  Use low-heat cooking methods, such as baking, instead of high-heat cooking methods like deep frying.  Cook using healthy oils, such as olive, canola, or sunflower oil.  Avoid cooking with butter, cream, or high-fat meats. Meal planning  Eat meals and snacks regularly, preferably at the same times every day. Avoid going long periods of time without eating.  Eat foods that are high in fiber, such as fresh fruits, vegetables, beans, and whole grains. Talk with your dietitian about how many servings of carbs you can eat at each meal.  Eat 4-6 oz (112-168 g) of lean protein each day, such as lean meat, chicken, fish, eggs, or tofu. One ounce (oz) of lean protein is equal to: ? 1 oz (28 g) of meat, chicken, or fish. ? 1 egg. ?  cup (62 g) of tofu.  Eat some foods each day that contain healthy fats, such as avocado, nuts, seeds, and fish.   What foods should I eat? Fruits Berries. Apples. Oranges. Peaches. Apricots. Plums. Grapes. Mango. Papaya. Pomegranate. Kiwi. Cherries. Vegetables Lettuce. Spinach. Leafy greens, including kale, chard, collard greens, and mustard greens. Beets. Cauliflower. Cabbage. Broccoli. Carrots. Green beans. Tomatoes. Peppers. Onions. Cucumbers. Brussels sprouts. Grains Whole grains, such as whole-wheat or whole-grain bread, crackers, tortillas, cereal, and pasta. Unsweetened oatmeal. Quinoa. Brown or wild rice. Meats and other proteins Seafood. Poultry without skin. Lean cuts of poultry and beef. Tofu. Nuts. Seeds. Dairy Low-fat or fat-free dairy products such as milk, yogurt, and cheese. The items listed above may not be a complete list of foods and beverages you can eat. Contact a dietitian for more information. What foods should I avoid? Fruits Fruits canned with  syrup. Vegetables Canned vegetables. Frozen vegetables with butter or cream sauce. Grains Refined white flour and flour products such as bread, pasta, snack foods, and cereals. Avoid all processed foods. Meats and other proteins Fatty cuts of meat. Poultry with skin. Breaded or fried meats. Processed meat. Avoid saturated fats. Dairy Full-fat yogurt, cheese, or milk. Beverages Sweetened drinks, such as soda or iced tea. The items listed above may not be a complete list of foods and beverages you should avoid. Contact a dietitian for more information. Questions to ask a health care provider  Do I need to meet with a diabetes educator?  Do I need to meet with a dietitian?  What number can I call if I have questions?  When are the best times to check my blood glucose? Where to find more information:  American Diabetes Association: diabetes.org  Academy of Nutrition and Dietetics: www.eatright.org  National Institute of Diabetes and Digestive and Kidney Diseases: www.niddk.nih.gov  Association of Diabetes Care and Education Specialists: www.diabeteseducator.org Summary  It is important to have healthy eating   habits because your blood sugar (glucose) levels are greatly affected by what you eat and drink.  A healthy meal plan will help you control your blood glucose and maintain a healthy lifestyle.  Your health care provider may recommend that you work with a dietitian to make a meal plan that is best for you.  Keep in mind that carbohydrates (carbs) and alcohol have immediate effects on your blood glucose levels. It is important to count carbs and to use alcohol carefully. This information is not intended to replace advice given to you by your health care provider. Make sure you discuss any questions you have with your health care provider. Document Revised: 12/07/2018 Document Reviewed: 12/07/2018 Elsevier Patient Education  2021 Elsevier Inc.  

## 2020-05-03 NOTE — Progress Notes (Signed)
 Subjective:    Patient ID: Jasmine Peck, female    DOB: 05/22/1975, 45 y.o.   MRN: [id number]  Chief Complaint  Patient presents with  . Weight Check    WT has been fluctuating  was taken off met formin 3 weeks ago because A1c was so good    PT presents to the office today to discus weight gain. She reports she lost 27 lbs over the last 6 months, but has gained all that over the 1-2 months. She reports she has continued with her increased water intake, cutting calories, and increased activity.   Her metformin was stopped on 03/15/20.  Diabetes She presents for her follow-up diabetic visit. She has type 2 diabetes mellitus. There are no hypoglycemic associated symptoms. Pertinent negatives for diabetes include no blurred vision and no foot paresthesias. There are no hypoglycemic complications. Symptoms are stable. Pertinent negatives for diabetic complications include no CVA, heart disease, nephropathy or peripheral neuropathy. Risk factors for coronary artery disease include dyslipidemia, diabetes mellitus, hypertension, sedentary lifestyle and post-menopausal. (Does not check BS at home) An ACE inhibitor/angiotensin II receptor blocker is being taken.  Nicotine Dependence Presents for follow-up visit. Her urge triggers include company of smokers. The symptoms have been stable. She smokes < 1/2 a pack of cigarettes per day.  Hypertension This is a chronic problem. The current episode started more than 1 year ago. The problem has been resolved since onset. The problem is controlled. Pertinent negatives include no blurred vision, peripheral edema or shortness of breath. The current treatment provides mild improvement. There is no history of CVA.      Review of Systems  Eyes: Negative for blurred vision.  Respiratory: Negative for shortness of breath.   All other systems reviewed and are negative.      Objective:   Physical Exam Vitals reviewed.  Constitutional:      General: She  is not in acute distress.    Appearance: She is well-developed. She is obese.  HENT:     Head: Normocephalic and atraumatic.     Right Ear: Tympanic membrane normal.     Left Ear: Tympanic membrane normal.  Eyes:     Pupils: Pupils are equal, round, and reactive to light.  Neck:     Thyroid: No thyromegaly.  Cardiovascular:     Rate and Rhythm: Normal rate and regular rhythm.     Heart sounds: Normal heart sounds. No murmur heard.   Pulmonary:     Effort: Pulmonary effort is normal. No respiratory distress.     Breath sounds: Normal breath sounds. No wheezing.  Abdominal:     General: Bowel sounds are normal. There is no distension.     Palpations: Abdomen is soft.     Tenderness: There is no abdominal tenderness.  Musculoskeletal:        General: No tenderness. Normal range of motion.     Cervical back: Normal range of motion and neck supple.  Skin:    General: Skin is warm and dry.  Neurological:     Mental Status: She is alert and oriented to person, place, and time.     Cranial Nerves: No cranial nerve deficit.     Deep Tendon Reflexes: Reflexes are normal and symmetric.  Psychiatric:        Behavior: Behavior normal.        Thought Content: Thought content normal.        Judgment: Judgment normal.            BP (!) 149/89   Pulse 79   Temp (!) 97.3 F (36.3 C) (Temporal)   Ht 5' 7" (1.702 m)   Wt (!) 305 lb 3.2 oz (138.4 kg)   BMI 47.80 kg/m   Assessment & Plan:  Jasmine Peck comes in today with chief complaint of Weight Check (WT has been fluctuating  was taken off met formin 3 weeks ago because A1c was so good )   Diagnosis and orders addressed:  1. Hypertension associated with diabetes (HCC)  2. Type 2 diabetes mellitus with diabetic polyneuropathy, without long-term current use of insulin (HCC) - Semaglutide,0.25 or 0.5MG/DOS, (OZEMPIC, 0.25 OR 0.5 MG/DOSE,) 2 MG/1.5ML SOPN; Inject 0.25 mg into the skin once a week for 28 days, THEN 0.5 mg once a  week for 28 days.  Dispense: 1.5 mL; Refill: 0 - Semaglutide, 1 MG/DOSE, (OZEMPIC, 1 MG/DOSE,) 4 MG/3ML SOPN; Inject 1 mg into the skin once a week.  Dispense: 6 mL; Refill: 6  3. Morbid obesity with BMI of 45.0-49.9, adult (HCC) - Semaglutide,0.25 or 0.5MG/DOS, (OZEMPIC, 0.25 OR 0.5 MG/DOSE,) 2 MG/1.5ML SOPN; Inject 0.25 mg into the skin once a week for 28 days, THEN 0.5 mg once a week for 28 days.  Dispense: 1.5 mL; Refill: 0 - Semaglutide, 1 MG/DOSE, (OZEMPIC, 1 MG/DOSE,) 4 MG/3ML SOPN; Inject 1 mg into the skin once a week.  Dispense: 6 mL; Refill: 6  4. Human immunodeficiency virus (HIV) disease (HCC)  5. Current smoker  6. Encounter for weight loss counseling - Semaglutide,0.25 or 0.5MG/DOS, (OZEMPIC, 0.25 OR 0.5 MG/DOSE,) 2 MG/1.5ML SOPN; Inject 0.25 mg into the skin once a week for 28 days, THEN 0.5 mg once a week for 28 days.  Dispense: 1.5 mL; Refill: 0 - Semaglutide, 1 MG/DOSE, (OZEMPIC, 1 MG/DOSE,) 4 MG/3ML SOPN; Inject 1 mg into the skin once a week.  Dispense: 6 mL; Refill: 6   Labs pending Health Maintenance reviewed Diet and exercise encouraged  Follow up plan: 2 months    Christy Hawks, FNP  

## 2020-05-07 ENCOUNTER — Telehealth: Payer: Self-pay | Admitting: *Deleted

## 2020-05-07 DIAGNOSIS — E1142 Type 2 diabetes mellitus with diabetic polyneuropathy: Secondary | ICD-10-CM

## 2020-05-07 NOTE — Telephone Encounter (Signed)
Jasmine Peck (Key: [id number]) Ozempic (0.25 or 0.5 MG/DOSE) 2MG /1.5ML pen-injectors   Form [employer] [facility] Electronic [place] Form [id number] NCPDP)  Sent to plan  May have to try byetta, bydureon or trulicity

## 2020-05-08 NOTE — Telephone Encounter (Signed)
Status: Denied. Notification: Completed 

## 2020-05-28 ENCOUNTER — Encounter: Payer: Self-pay | Admitting: Family Medicine

## 2020-06-16 ENCOUNTER — Other Ambulatory Visit: Payer: Self-pay | Admitting: Family

## 2020-06-16 DIAGNOSIS — E1142 Type 2 diabetes mellitus with diabetic polyneuropathy: Secondary | ICD-10-CM

## 2020-06-18 ENCOUNTER — Ambulatory Visit: Payer: Medicaid Other | Admitting: Family

## 2020-06-20 ENCOUNTER — Encounter: Payer: Self-pay | Admitting: Family

## 2020-07-06 ENCOUNTER — Other Ambulatory Visit: Payer: Self-pay

## 2020-07-06 ENCOUNTER — Ambulatory Visit: Payer: Medicaid Other | Admitting: Family

## 2020-07-06 ENCOUNTER — Encounter: Payer: Self-pay | Admitting: Family

## 2020-07-06 VITALS — BP 126/88 | HR 72 | Temp 97.5°F | Resp 20 | Ht 67.0 in | Wt 303.0 lb

## 2020-07-06 DIAGNOSIS — Z6841 Body Mass Index (BMI) 40.0 and over, adult: Secondary | ICD-10-CM

## 2020-07-06 DIAGNOSIS — E1142 Type 2 diabetes mellitus with diabetic polyneuropathy: Secondary | ICD-10-CM | POA: Diagnosis not present

## 2020-07-06 DIAGNOSIS — L409 Psoriasis, unspecified: Secondary | ICD-10-CM

## 2020-07-06 DIAGNOSIS — F411 Generalized anxiety disorder: Secondary | ICD-10-CM

## 2020-07-06 DIAGNOSIS — E1169 Type 2 diabetes mellitus with other specified complication: Secondary | ICD-10-CM | POA: Diagnosis not present

## 2020-07-06 DIAGNOSIS — I152 Hypertension secondary to endocrine disorders: Secondary | ICD-10-CM | POA: Diagnosis not present

## 2020-07-06 DIAGNOSIS — F17200 Nicotine dependence, unspecified, uncomplicated: Secondary | ICD-10-CM

## 2020-07-06 DIAGNOSIS — K219 Gastro-esophageal reflux disease without esophagitis: Secondary | ICD-10-CM | POA: Diagnosis not present

## 2020-07-06 DIAGNOSIS — E785 Hyperlipidemia, unspecified: Secondary | ICD-10-CM

## 2020-07-06 DIAGNOSIS — E1159 Type 2 diabetes mellitus with other circulatory complications: Secondary | ICD-10-CM

## 2020-07-06 DIAGNOSIS — E559 Vitamin D deficiency, unspecified: Secondary | ICD-10-CM

## 2020-07-06 DIAGNOSIS — B20 Human immunodeficiency virus [HIV] disease: Secondary | ICD-10-CM

## 2020-07-06 DIAGNOSIS — F331 Major depressive disorder, recurrent, moderate: Secondary | ICD-10-CM

## 2020-07-06 LAB — BAYER DCA HB A1C WAIVED: HB A1C (BAYER DCA - WAIVED): 5.8 % (ref <7.0)

## 2020-07-06 MED ORDER — CLOBETASOL PROPIONATE 0.05 % EX OINT: 1.0000 "application " | TOPICAL_OINTMENT | Freq: Two times a day (BID) | CUTANEOUS | 2 refills | Status: DC

## 2020-07-06 MED ORDER — LISINOPRIL 20 MG PO TABS: 20.0000 mg | ORAL_TABLET | Freq: Every day | ORAL | 3 refills | Status: DC

## 2020-07-06 MED ORDER — TRULICITY 0.75 MG/0.5ML ~~LOC~~ SOAJ: 0.7500 mg | SUBCUTANEOUS | 3 refills | Status: DC

## 2020-07-06 MED ORDER — TRIAMCINOLONE ACETONIDE 0.5 % EX OINT: 1.0000 "application " | TOPICAL_OINTMENT | Freq: Two times a day (BID) | CUTANEOUS | 2 refills | Status: DC

## 2020-07-06 NOTE — Patient Instructions (Signed)
Psoriasis Psoriasis is a long-term (chronic) skin condition. It occurs because your immune system causes skin cells to form too quickly. As a result, too many skin cells grow and create raised, red patches (plaques) that often look silvery on your skin. Plaques may show up anywhere on your body. They can be any size or shape. Symptoms of this condition range from mildto very severe. Psoriasis cannot be passed from one person to another (is not contagious). Sometimes, the symptoms go away and then come back again. What are the causes? The cause of psoriasis is not known, but certain factors can make the condition worse. These include: Damage or trauma to the skin, such as cuts, scrapes, sunburn, and dryness. Not enough exposure to sunlight. Certain medicines. Alcohol. Tobacco use. Stress. Infections caused by bacteria or viruses. What increases the risk? You are more likely to develop this condition if you: Have a family history of psoriasis. Are obese. Are 8-40 years old. Are taking certain medicines. What are the signs or symptoms? There are different types of psoriasis. You can have more than one type of psoriasis during your life. The types are: Plaque. This is the most common. Guttate. This is also called eruptive psoriasis. Inverse. Pustular. Erythrodermic. Sebopsoriasis. Psoriatic arthritis. Each type of psoriasis has different symptoms. Plaque psoriasis symptoms include red, raised plaques with a silvery-white coating (scale). These plaques may be itchy. Your nails may be pitted and crumbly or fall off. Guttate psoriasis symptoms include small red spots that often show up on your trunk, arms, and legs. These spots may develop after you have been sick, especially with strep throat. Inverse psoriasis symptoms include plaques in your underarm area, under your breasts, or on your genitals, groin, or buttocks. Pustular psoriasis symptoms include pus-filled bumps that are painful,  red, and swollen on the palms of your hands or the soles of your feet. You also may feel exhausted, feverish, weak, or have no appetite. Erythrodermic psoriasis symptoms include bright red skin that may look burned. You may have a fast heartbeat and a body temperature that is too high or too low. You may be itchy or in pain. Sebopsoriasis symptoms include red plaques that have a greasy coating, and are often on your scalp, forehead, and face. Psoriatic arthritis causes swollen, painful joints along with scaly skin plaques. How is this diagnosed? This condition is diagnosed based on your symptoms, family history, and a physical exam. You may also be referred to a health care provider who specializes in skin diseases (dermatologist). Your health care provider may remove a tissue sample (biopsy) for testing. How is this treated? There is no cure for this condition, but treatment can help manage it. Goals of treatment include: Helping your skin heal. Reducing itching and inflammation. Slowing the growth of new skin cells. Helping your immune system respond better to your skin. Treatment varies, depending on the severity of your condition. This condition may be treated by: Creams or ointments to help with symptoms. Ultraviolet ray exposure (light therapy or phototherapy). This may include natural sunlight or light therapy in a medical office. Medicines (systemic therapy). These medicines can help your body better manage skin cell turnover and inflammation. Medicines may be given in the form of pills or injections. They may be used along with light therapy or ointments. You may also get antibiotic medicines if you have an infection. Follow these instructions at home: [place] your skin as needed. Only use moisturizers that have been approved by your  health care provider. Apply cool, wet cloths (cold compresses) to the affected areas. Do not use a hot tub or take hot showers. Take lukewarm  showers and baths. Do not scratch your skin. Lifestyle  Do not use any products that contain nicotine or tobacco, such as cigarettes, e-cigarettes, and chewing tobacco. If you need help quitting, ask your health care provider. Use techniques for stress reduction, such as meditation or yoga. Maintain a healthy weight. Follow instructions from your health care provider for weight control. These may include dietary restrictions. Get safe exposure to the sun as told by your health care provider. This may include spending regular intervals of time outdoors in sunlight. Do not get sunburned. Consider joining a psoriasis support group.  Medicines Take or use over-the-counter and prescription medicines only as told by your health care provider. If you were prescribed an antibiotic medicine, take it as told by your health care provider. Do not stop using the antibiotic even if you start to feel better. Alcohol use Limit how much you use: 0-1 drink a day for women. 0-2 drinks a day for men. Be aware of how much alcohol is in your drink. In the [place] one drink equals one 12 oz bottle of beer (355 mL), one 5 oz glass of wine (148 mL), or one 1 oz glass of hard liquor (44 mL). General instructions Keep a journal to help track what triggers an outbreak. Try to avoid any triggers. See a counselor if feelings of sadness, frustration, and hopelessness about your condition are interfering with your work and relationships. Keep all follow-up visits as told by your health care provider. This is important. Contact a health care provider if: You have a fever. Your pain gets worse. You have increasing redness or warmth in the affected areas. You have new or worsening pain or stiffness in your joints. Your nails start to break easily or pull away from the nail bed. You feel depressed. Summary Psoriasis is a long-term (chronic) skin condition. Patches (plaques) may show up anywhere on your body. There is no  cure for this condition, but treatment can help manage it. Treatment varies, depending on the severity of your condition. Keep a journal to track what triggers an outbreak. Try to avoid any triggers. Take or use over-the-counter and prescription medicines only as told by your health care provider. Keep all follow-up visits as told by your health care provider. This is important. This information is not intended to replace advice given to you by your health care provider. Make sure you discuss any questions you have with your healthcare provider. Document Revised: 11/03/2017 Document Reviewed: 11/03/2017 Elsevier Patient Education  2022 [employer].

## 2020-07-06 NOTE — Progress Notes (Signed)
Subjective:    Patient ID: Jasmine Peck, female    DOB: Jun 15, 1975, 45 y.o.   MRN: [id number]  Chief Complaint  Patient presents with   Medical Management of Chronic Issues   Pt presents to the office today for chronic follow up. Pt is HIV positive and is followed by Infectious Disease every 6 months. Jasmine Peck is followed by Dermatologist for psoriasis.     Jasmine Peck is reports Jasmine Peck started smoking when Jasmine Peck was 45 years old and been smoking for 30 years. Diabetes Jasmine Peck presents for her follow-up diabetic visit. Jasmine Peck has type 2 diabetes mellitus. Her disease course has been stable. Hypoglycemia symptoms include nervousness/anxiousness. Associated symptoms include foot paresthesias. Pertinent negatives for diabetes include no blurred vision. Symptoms are stable. Diabetic complications include heart disease, nephropathy and peripheral neuropathy. Risk factors for coronary artery disease include dyslipidemia, diabetes mellitus, hypertension, sedentary lifestyle and post-menopausal. Jasmine Peck is following a generally unhealthy diet. (Does not check BS at home) An ACE inhibitor/angiotensin II receptor blocker is being taken. Eye exam is current.  Hypertension This is a chronic problem. The current episode started more than 1 year ago. The problem has been resolved since onset. Associated symptoms include malaise/fatigue and peripheral edema. Pertinent negatives include no blurred vision or shortness of breath. Risk factors for coronary artery disease include dyslipidemia, diabetes mellitus, obesity and sedentary lifestyle. The current treatment provides moderate improvement.  Hyperlipidemia This is a chronic problem. The current episode started more than 1 year ago. Exacerbating diseases include obesity. Pertinent negatives include no shortness of breath. Current antihyperlipidemic treatment includes diet change. The current treatment provides no improvement of lipids.  Gastroesophageal Reflux Jasmine Peck complains of belching  and heartburn. This is a chronic problem. The current episode started more than 1 year ago. The problem occurs occasionally. The problem has been waxing and waning. Risk factors include obesity. Jasmine Peck has tried a PPI for the symptoms. The treatment provided moderate relief.  Rash This is a chronic problem. The current episode started more than 1 year ago. Pertinent negatives include no shortness of breath. Treatments tried: steorid cream.  Anxiety Presents for follow-up visit. Symptoms include depressed mood, excessive worry, irritability, nervous/anxious behavior and restlessness. Patient reports no shortness of breath. Symptoms occur most days. The severity of symptoms is moderate. The quality of sleep is good.    Depression        This is a chronic problem.  The current episode started more than 1 year ago.   Associated symptoms include irritable and restlessness.  Associated symptoms include no helplessness and no hopelessness.    Review of Systems  Constitutional:  Positive for irritability and malaise/fatigue.  Eyes:  Negative for blurred vision.  Respiratory:  Negative for shortness of breath.   Gastrointestinal:  Positive for heartburn.  Skin:  Positive for rash.  Psychiatric/Behavioral:  Positive for depression. The patient is nervous/anxious.   All other systems reviewed and are negative.     Objective:   Physical Exam Vitals reviewed.  Constitutional:      General: Jasmine Peck is irritable. Jasmine Peck is not in acute distress.    Appearance: Jasmine Peck is well-developed. Jasmine Peck is obese.  HENT:     Head: Normocephalic and atraumatic.     Right Ear: Tympanic membrane normal.     Left Ear: Tympanic membrane normal.  Eyes:     Pupils: Pupils are equal, round, and reactive to light.  Neck:     Thyroid: No thyromegaly.  Cardiovascular:     Rate and  Rhythm: Normal rate and regular rhythm.     Heart sounds: Normal heart sounds. No murmur heard. Pulmonary:     Effort: Pulmonary effort is normal. No  respiratory distress.     Breath sounds: Normal breath sounds. No wheezing.  Abdominal:     General: Bowel sounds are normal. There is no distension.     Palpations: Abdomen is soft.     Tenderness: There is no abdominal tenderness.  Musculoskeletal:        General: No tenderness. Normal range of motion.     Cervical back: Normal range of motion and neck supple.     Right lower leg: Edema (trace) present.     Left lower leg: Edema (trace) present.  Skin:    General: Skin is warm and dry.     Findings: Rash present.          Comments: Psoriasis on elbows and buttocks   Neurological:     Mental Status: Jasmine Peck is alert and oriented to person, place, and time.     Cranial Nerves: No cranial nerve deficit.     Deep Tendon Reflexes: Reflexes are normal and symmetric.  Psychiatric:        Behavior: Behavior normal.        Thought Content: Thought content normal.        Judgment: Judgment normal.      BP 126/88   Pulse 72   Temp (!) 97.5 F (36.4 C) (Temporal)   Resp 20   Ht 5' 7" (1.702 m)   Wt (!) 303 lb (137.4 kg)   SpO2 100%   BMI 47.46 kg/m      Assessment & Plan:  Sequoia Witz comes in today with chief complaint of Medical Management of Chronic Issues   Diagnosis and orders addressed:  1. Type 2 diabetes mellitus with diabetic polyneuropathy, without long-term current use of insulin (HCC) Start Trulicity  - Bayer DCA Hb A1c Waived - Lipid panel - Dulaglutide (TRULICITY) 0.07 MA/2.6JF SOPN; Inject 0.75 mg into the skin once a week.  Dispense: 4 mL; Refill: 3  2. Hypertension associated with diabetes ([place]) - CBC with Differential/Platelet - CMP14+EGFR - lisinopril (ZESTRIL) 20 MG tablet; Take 1 tablet (20 mg total) by mouth daily.  Dispense: 90 tablet; Refill: 3  3. Gastroesophageal reflux disease, unspecified whether esophagitis present  4. Hyperlipidemia associated with type 2 diabetes mellitus ([place])  5. Psoriasis - clobetasol ointment (TEMOVATE) 0.05 %;  Apply 1 application topically 2 (two) times daily.  Dispense: 60 g; Refill: 2 - triamcinolone ointment (KENALOG) 0.5 %; Apply 1 application topically 2 (two) times daily.  Dispense: 90 g; Refill: 2  6. Current smoker  7. GAD (generalized anxiety disorder)  8. Moderate episode of recurrent major depressive disorder ([place])  9. Human immunodeficiency virus (HIV) disease ([place])  10. Vitamin D deficiency  11. Morbid obesity with BMI of 45.0-49.9, adult [facility])   Labs pending Health Maintenance reviewed Diet and exercise encouraged  Follow up plan: 3 months   [name], FNP

## 2020-07-07 LAB — LIPID PANEL
Chol/HDL Ratio: 3.6 ratio (ref 0.0–4.4)
Cholesterol, Total: 159 mg/dL (ref 100–199)
HDL: 44 mg/dL (ref >39)
LDL Chol Calc (NIH): 99 mg/dL (ref 0–99)
Triglycerides: 85 mg/dL (ref 0–149)
VLDL Cholesterol Cal: 16 mg/dL (ref 5–40)

## 2020-07-07 LAB — CMP14+EGFR
ALT: 6 IU/L (ref 0–32)
AST: 7 IU/L (ref 0–40)
Albumin/Globulin Ratio: 2.1 (ref 1.2–2.2)
Albumin: 4.4 g/dL (ref 3.8–4.8)
Alkaline Phosphatase: 82 IU/L (ref 44–121)
BUN/Creatinine Ratio: 11 (ref 9–23)
BUN: 10 mg/dL (ref 6–24)
Bilirubin Total: 0.4 mg/dL (ref 0.0–1.2)
CO2: 21 mmol/L (ref 20–29)
Calcium: 9.4 mg/dL (ref 8.7–10.2)
Chloride: 102 mmol/L (ref 96–106)
Creatinine, Ser: 0.88 mg/dL (ref 0.57–1.00)
Globulin, Total: 2.1 g/dL (ref 1.5–4.5)
Glucose: 119 mg/dL — ABNORMAL HIGH (ref 65–99)
Potassium: 3.9 mmol/L (ref 3.5–5.2)
Sodium: 139 mmol/L (ref 134–144)
Total Protein: 6.5 g/dL (ref 6.0–8.5)
eGFR: 83 mL/min/{1.73_m2} (ref >59)

## 2020-07-07 LAB — CBC WITH DIFFERENTIAL/PLATELET
Basophils Absolute: 0.1 10*3/uL (ref 0.0–0.2)
Basos: 1 %
EOS (ABSOLUTE): 0.2 10*3/uL (ref 0.0–0.4)
Eos: 2 %
Hematocrit: 39.4 % (ref 34.0–46.6)
Hemoglobin: 13.6 g/dL (ref 11.1–15.9)
Immature Grans (Abs): 0 10*3/uL (ref 0.0–0.1)
Immature Granulocytes: 0 %
Lymphocytes Absolute: 3.5 10*3/uL — ABNORMAL HIGH (ref 0.7–3.1)
Lymphs: 42 %
MCH: 32.9 pg (ref 26.6–33.0)
MCHC: 34.5 g/dL (ref 31.5–35.7)
MCV: 95 fL (ref 79–97)
Monocytes Absolute: 0.6 10*3/uL (ref 0.1–0.9)
Monocytes: 7 %
Neutrophils Absolute: 4 10*3/uL (ref 1.4–7.0)
Neutrophils: 48 %
Platelets: 328 10*3/uL (ref 150–450)
RBC: 4.14 x10E6/uL (ref 3.77–5.28)
RDW: 13.7 % (ref 11.7–15.4)
WBC: 8.4 10*3/uL (ref 3.4–10.8)

## 2020-07-11 ENCOUNTER — Ambulatory Visit: Admit: 2020-07-11 | Discharge: 2020-07-12 | Payer: BLUE CROSS/BLUE SHIELD | Attending: Family | Primary: Family

## 2020-07-11 DIAGNOSIS — J020 Streptococcal pharyngitis: Principal | ICD-10-CM

## 2020-07-11 DIAGNOSIS — H9209 Otalgia, unspecified ear: Principal | ICD-10-CM

## 2020-07-11 DIAGNOSIS — H6982 Other specified disorders of Eustachian tube, left ear: Principal | ICD-10-CM

## 2020-07-11 DIAGNOSIS — R0981 Nasal congestion: Principal | ICD-10-CM

## 2020-07-11 DIAGNOSIS — J029 Acute pharyngitis, unspecified: Principal | ICD-10-CM

## 2020-07-11 DIAGNOSIS — J329 Chronic sinusitis, unspecified: Principal | ICD-10-CM

## 2020-07-11 MED ORDER — AMOXICILLIN 875 MG TABLET
ORAL_TABLET | Freq: Two times a day (BID) | ORAL | 0 refills | 10 days | Status: CP
Start: 2020-07-11 — End: ?

## 2020-07-11 MED ORDER — FLUCONAZOLE 150 MG TABLET
ORAL_TABLET | Freq: Every day | ORAL | 1 refills | 2 days | Status: CP
Start: 2020-07-11 — End: ?

## 2020-07-11 MED ORDER — PREDNISONE 5 MG TABLET
ORAL_TABLET | 0 refills | 0 days | Status: CP
Start: 2020-07-11 — End: ?

## 2020-07-13 ENCOUNTER — Telehealth: Payer: Self-pay | Admitting: *Deleted

## 2020-07-13 DIAGNOSIS — E1142 Type 2 diabetes mellitus with diabetic polyneuropathy: Secondary | ICD-10-CM

## 2020-07-13 MED ORDER — TRULICITY 0.75 MG/0.5ML ~~LOC~~ SOAJ: 0.7500 mg | SUBCUTANEOUS | 3 refills | Status: DC

## 2020-07-13 NOTE — Telephone Encounter (Signed)
Patient aware that we have sent to [facility]. Rx was originally sent to [facility] and they are unable to fill for her. We have resent to [facility].

## 2020-07-20 ENCOUNTER — Other Ambulatory Visit: Payer: Self-pay | Admitting: Family

## 2020-08-01 DIAGNOSIS — H5213 Myopia, bilateral: Secondary | ICD-10-CM | POA: Diagnosis not present

## 2020-08-01 LAB — HM DIABETES EYE EXAM

## 2020-08-08 ENCOUNTER — Other Ambulatory Visit: Payer: Self-pay

## 2020-08-08 ENCOUNTER — Ambulatory Visit: Payer: Medicaid Other | Admitting: Nurse Practitioner

## 2020-08-08 ENCOUNTER — Encounter: Payer: Self-pay | Admitting: Nurse Practitioner

## 2020-08-08 VITALS — BP 113/75 | HR 86 | Temp 97.7°F | Ht 67.0 in | Wt 308.0 lb

## 2020-08-08 DIAGNOSIS — R319 Hematuria, unspecified: Secondary | ICD-10-CM | POA: Insufficient documentation

## 2020-08-08 DIAGNOSIS — M545 Low back pain, unspecified: Secondary | ICD-10-CM

## 2020-08-08 LAB — URINALYSIS, ROUTINE W REFLEX MICROSCOPIC
Bilirubin, UA: NEGATIVE
Glucose, UA: NEGATIVE
Nitrite, UA: NEGATIVE
Protein,UA: NEGATIVE
RBC, UA: NEGATIVE
Specific Gravity, UA: 1.025 (ref 1.005–1.030)
Urobilinogen, Ur: 1 mg/dL (ref 0.2–1.0)
pH, UA: 5.5 (ref 5.0–7.5)

## 2020-08-08 LAB — MICROSCOPIC EXAMINATION: RBC, Urine: NONE SEEN /hpf (ref 0–2)

## 2020-08-08 NOTE — Progress Notes (Signed)
Acute Office Visit  Subjective:    Patient ID: Jasmine Peck, female    DOB: 01-20-1975, 45 y.o.   MRN: [id number]  No chief complaint on file.   Dysuria  This is a new problem. The current episode started in the past 7 days. The problem occurs every urination. The problem has been unchanged. The quality of the pain is described as aching. The pain is mild. Associated symptoms include hematuria and nausea. Pertinent negatives include no chills. She has tried nothing for the symptoms.    Past Medical History:  Diagnosis Date   Anxiety    Depression    Diabetes mellitus without complication (HCC)    GERD (gastroesophageal reflux disease)    HIV infection ([place])    Hypertension    Psoriasis    per patient     Past Surgical History:  Procedure Laterality Date   CHOLECYSTECTOMY     DILATION AND CURETTAGE OF UTERUS     MOUTH SURGERY     all teeth have been removed     Family History  Problem Relation Age of Onset   Heart disease Mother    Diabetes Mother    Hypertension Mother    Early death Father    Diabetes Father    Hypertension Father    Diabetes Brother    Hypertension Brother    Hyperlipidemia Brother    Heart disease Maternal Grandmother    Diabetes Maternal Grandmother    Diabetes Paternal Grandfather    Heart disease Paternal Grandfather    GER disease Daughter    Healthy Daughter    Healthy Daughter    Healthy Daughter    Asthma Daughter     Social History   Socioeconomic History   Marital status: Divorced    Spouse name: Not on file   Number of children: Not on file   Years of education: Not on file   Highest education level: Not on file  Occupational History   Not on file  Tobacco Use   Smoking status: Every Day    Packs/day: 0.50    Types: Cigarettes   Smokeless tobacco: Never  Vaping Use   Vaping Use: Never used  Substance and Sexual Activity   Alcohol use: No    Alcohol/week: 0.0 standard drinks   Drug use: No   Sexual  activity: Never  Other Topics Concern   Not on file  Social History Narrative   Not on file   Social Determinants of Health   Financial Resource Strain: Low Risk    Difficulty of Paying Living Expenses: Not hard at all  Food Insecurity: No Food Insecurity   Worried About Charity fundraiser in the Last Year: Never true   Ran Out of Food in the Last Year: Never true  Transportation Needs: No Transportation Needs   Lack of Transportation (Medical): No   Lack of Transportation (Non-Medical): No  Physical Activity: Inactive   Days of Exercise per Week: 0 days   Minutes of Exercise per Session: 0 min  Stress: Stress Concern Present   Feeling of Stress : Rather much  Social Connections: Moderately Isolated   Frequency of Communication with Friends and Family: More than three times a week   Frequency of Social Gatherings with Friends and Family: Three times a week   Attends Religious Services: Never   Active Member of Clubs or Organizations: Yes   Attends Archivist Meetings: Never   Marital Status: Never married  Human resources officer  Violence: Not At Risk   Fear of Current or Ex-Partner: No   Emotionally Abused: No   Physically Abused: No   Sexually Abused: No    Outpatient Medications Prior to Visit  Medication Sig Dispense Refill   amLODipine (NORVASC) 10 MG tablet Take 1 tablet (10 mg total) by mouth daily. 90 tablet 3   cetirizine (ZYRTEC) 10 MG tablet Take 1 tablet (10 mg total) by mouth daily. 90 tablet 4   clobetasol ointment (TEMOVATE) 7.82 % Apply 1 application topically 2 (two) times daily. 60 g 2   Dulaglutide (TRULICITY) 4.23 NT/6.1WE SOPN Inject 0.75 mg into the skin once a week. 4 mL 3   fluticasone (FLONASE) 50 MCG/ACT nasal spray Place 2 sprays into both nostrils daily. 16 g 6   furosemide (LASIX) 20 MG tablet Take 1 tablet (20 mg total) by mouth daily. 90 tablet 1   gabapentin (NEURONTIN) 600 MG tablet TAKE ONE TABLET BY MOUTH THREE TIMES DAILY 90 tablet 1    lisinopril (ZESTRIL) 20 MG tablet Take 1 tablet (20 mg total) by mouth daily. 90 tablet 3   omeprazole (PRILOSEC) 20 MG capsule TAKE ONE CAPSULE BY MOUTH EVERY DAY, NEEDS APPOINTMENT FOR REFILLS 90 capsule 4   OVER THE COUNTER MEDICATION      triamcinolone ointment (KENALOG) 0.5 % Apply 1 application topically 2 (two) times daily. 90 g 2   TRIUMEQ 600-50-300 MG tablet TAKE ONE TABLET BY MOUTH EVERY DAY 30 tablet 11   No facility-administered medications prior to visit.    Allergies  Allergen Reactions   Clindamycin/Lincomycin     rash   Erythromycin Other (See Comments)    Abdominal pain    Review of Systems  Constitutional:  Negative for chills.  HENT: Negative.    Gastrointestinal:  Positive for nausea.  Genitourinary:  Positive for hematuria.  Musculoskeletal:  Positive for back pain.  Skin:  Negative for rash.  All other systems reviewed and are negative.     Objective:    Physical Exam Vitals and nursing note reviewed.  Constitutional:      Appearance: Normal appearance.  HENT:     Head: Normocephalic.     Nose: Nose normal.  Eyes:     Conjunctiva/sclera: Conjunctivae normal.  Cardiovascular:     Rate and Rhythm: Normal rate.     Pulses: Normal pulses.     Heart sounds: Normal heart sounds.  Abdominal:     General: Bowel sounds are normal.  Musculoskeletal:     Lumbar back: Tenderness present.  Skin:    Findings: No rash.  Neurological:     Mental Status: She is alert and oriented to person, place, and time.  Psychiatric:        Behavior: Behavior normal.    BP 113/75   Pulse 86   Temp 97.7 F (36.5 C) (Temporal)   Ht $R'5\' 7"'ow$  (1.702 m)   Wt (!) 308 lb (139.7 kg)   SpO2 97%   BMI 48.24 kg/m  Wt Readings from Last 3 Encounters:  08/08/20 (!) 308 lb (139.7 kg)  07/06/20 (!) 303 lb (137.4 kg)  05/03/20 (!) 305 lb 3.2 oz (138.4 kg)    Health Maintenance Due  Topic Date Due   COVID-19 Vaccine (1) Never done    There are no preventive care  reminders to display for this patient.   Lab Results  Component Value Date   TSH 1.370 11/15/2019   Lab Results  Component Value Date   WBC  8.4 07/06/2020   HGB 13.6 07/06/2020   HCT 39.4 07/06/2020   MCV 95 07/06/2020   PLT 328 07/06/2020   Lab Results  Component Value Date   NA 139 07/06/2020   K 3.9 07/06/2020   CO2 21 07/06/2020   GLUCOSE 119 (H) 07/06/2020   BUN 10 07/06/2020   CREATININE 0.88 07/06/2020   BILITOT 0.4 07/06/2020   ALKPHOS 82 07/06/2020   AST 7 07/06/2020   ALT 6 07/06/2020   PROT 6.5 07/06/2020   ALBUMIN 4.4 07/06/2020   CALCIUM 9.4 07/06/2020   ANIONGAP 9 04/17/2020   EGFR 83 07/06/2020   Lab Results  Component Value Date   CHOL 159 07/06/2020   Lab Results  Component Value Date   HDL 44 07/06/2020   Lab Results  Component Value Date   LDLCALC 99 07/06/2020   Lab Results  Component Value Date   TRIG 85 07/06/2020   Lab Results  Component Value Date   CHOLHDL 3.6 07/06/2020   Lab Results  Component Value Date   HGBA1C 5.8 07/06/2020       Assessment & Plan:   Problem List Items Addressed This Visit       Other   Hematuria - Primary    Symptoms of dysuria in the past 4 to 7 days with slight blood and discomfort.  Urinalysis completed-abnormal results, started patient on Keflex.  Urine sent for cultures results pending.  Encourage patient to increase hydration, wipe from front to back, printed handouts given.  Rx sent to pharmacy.       Relevant Orders   Urinalysis, Routine w reflex microscopic (Completed)   Other Visit Diagnoses     Acute low back pain without sciatica, unspecified back pain laterality       Relevant Orders   Urinalysis, Routine w reflex microscopic (Completed)        No orders of the defined types were placed in this encounter.    Ivy Lynn, NP

## 2020-08-08 NOTE — Assessment & Plan Note (Signed)
Symptoms of dysuria in the past 4 to 7 days with slight blood and discomfort.  Urinalysis completed-abnormal results, started patient on Keflex.  Urine sent for cultures results pending.  Encourage patient to increase hydration, wipe from front to back, printed handouts given.  Rx sent to pharmacy.

## 2020-08-08 NOTE — Patient Instructions (Signed)

## 2020-08-09 ENCOUNTER — Other Ambulatory Visit: Payer: Self-pay | Admitting: Family Medicine

## 2020-08-09 ENCOUNTER — Telehealth: Payer: Self-pay | Admitting: Family

## 2020-08-09 DIAGNOSIS — E1142 Type 2 diabetes mellitus with diabetic polyneuropathy: Secondary | ICD-10-CM

## 2020-08-09 MED ORDER — CEPHALEXIN 500 MG PO CAPS: 500.0000 mg | ORAL_CAPSULE | Freq: Two times a day (BID) | ORAL | 0 refills | Status: DC

## 2020-08-09 NOTE — Telephone Encounter (Signed)
Seen Je please advise your pcp

## 2020-08-09 NOTE — Telephone Encounter (Signed)
Patient aware and verbalized understanding. °

## 2020-08-09 NOTE — Telephone Encounter (Signed)
Keflex Prescription sent to pharmacy

## 2020-08-15 ENCOUNTER — Other Ambulatory Visit: Payer: Self-pay | Admitting: Family

## 2020-08-15 DIAGNOSIS — J301 Allergic rhinitis due to pollen: Secondary | ICD-10-CM

## 2020-08-15 DIAGNOSIS — E785 Hyperlipidemia, unspecified: Secondary | ICD-10-CM

## 2020-08-15 DIAGNOSIS — E1169 Type 2 diabetes mellitus with other specified complication: Secondary | ICD-10-CM

## 2020-08-16 DIAGNOSIS — B20 Human immunodeficiency virus [HIV] disease: Secondary | ICD-10-CM | POA: Diagnosis not present

## 2020-08-22 ENCOUNTER — Other Ambulatory Visit: Payer: Self-pay | Admitting: Family

## 2020-08-22 DIAGNOSIS — E1142 Type 2 diabetes mellitus with diabetic polyneuropathy: Secondary | ICD-10-CM

## 2020-08-28 ENCOUNTER — Other Ambulatory Visit: Payer: Self-pay | Admitting: Family

## 2020-08-28 DIAGNOSIS — E1142 Type 2 diabetes mellitus with diabetic polyneuropathy: Secondary | ICD-10-CM

## 2020-09-04 ENCOUNTER — Other Ambulatory Visit: Payer: Self-pay | Admitting: Family

## 2020-09-04 DIAGNOSIS — E1142 Type 2 diabetes mellitus with diabetic polyneuropathy: Secondary | ICD-10-CM

## 2020-09-05 ENCOUNTER — Other Ambulatory Visit: Payer: Self-pay | Admitting: Family

## 2020-09-06 ENCOUNTER — Other Ambulatory Visit: Payer: Self-pay

## 2020-09-06 ENCOUNTER — Telehealth: Payer: Self-pay | Admitting: *Deleted

## 2020-09-06 ENCOUNTER — Encounter: Payer: Self-pay | Admitting: Family

## 2020-09-06 ENCOUNTER — Ambulatory Visit: Payer: Medicaid Other | Admitting: Family

## 2020-09-06 VITALS — BP 125/85 | HR 98 | Temp 97.5°F | Ht 67.0 in | Wt 301.6 lb

## 2020-09-06 DIAGNOSIS — M79652 Pain in left thigh: Secondary | ICD-10-CM

## 2020-09-06 MED ORDER — DICLOFENAC SODIUM 1 % EX GEL: 2.0000 g | Freq: Four times a day (QID) | CUTANEOUS | 2 refills | Status: DC

## 2020-09-06 MED ORDER — BACLOFEN 10 MG PO TABS
10.0000 mg | ORAL_TABLET | Freq: Three times a day (TID) | ORAL | 0 refills | Status: DC
Start: 2020-09-06 — End: 2021-05-01

## 2020-09-06 MED ORDER — DICLOFENAC SODIUM 75 MG PO TBEC: 75.0000 mg | DELAYED_RELEASE_TABLET | Freq: Two times a day (BID) | ORAL | 2 refills | Status: DC

## 2020-09-06 NOTE — Telephone Encounter (Signed)
Status: Approved PA Case: [id number]  Coverage Starts on: 09/06/2020 12:00:00 AM, Coverage Ends on: 09/06/2021 12:00:00 AM.

## 2020-09-06 NOTE — Progress Notes (Signed)
Subjective:    Patient ID: Jasmine Peck, female    DOB: 04-04-1975, 45 y.o.   MRN: [id number]  Chief Complaint  Patient presents with   Leg Pain    LEFT LEG ON FEET A LOT GOES UP A STEP BEEN HURTING.    Leg Pain   Pt presents to the office today with left thigh pain that started three days ago. She reports she has been working 10 hours a day and having to step up and down on a stair.   She reports the pain is an aching pain 10 out 10. She has used warm compresses without success.    Review of Systems  All other systems reviewed and are negative.     Objective:   Physical Exam Vitals reviewed.  Constitutional:      General: She is not in acute distress.    Appearance: She is well-developed. She is obese.  HENT:     Head: Normocephalic and atraumatic.     Right Ear: Tympanic membrane normal.     Left Ear: Tympanic membrane normal.  Eyes:     Pupils: Pupils are equal, round, and reactive to light.  Neck:     Thyroid: No thyromegaly.  Cardiovascular:     Rate and Rhythm: Normal rate and regular rhythm.     Heart sounds: Normal heart sounds. No murmur heard. Pulmonary:     Effort: Pulmonary effort is normal. No respiratory distress.     Breath sounds: Normal breath sounds. No wheezing.  Abdominal:     General: Bowel sounds are normal. There is no distension.     Palpations: Abdomen is soft.     Tenderness: There is no abdominal tenderness.  Musculoskeletal:        General: No tenderness.     Cervical back: Normal range of motion and neck supple.     Comments: Full ROM left leg  Skin:    General: Skin is warm and dry.  Neurological:     Mental Status: She is alert and oriented to person, place, and time.     Cranial Nerves: No cranial nerve deficit.     Deep Tendon Reflexes: Reflexes are normal and symmetric.  Psychiatric:        Behavior: Behavior normal.        Thought Content: Thought content normal.        Judgment: Judgment normal.      BP 125/85    Pulse 98   Temp (!) 97.5 F (36.4 C) (Temporal)   Ht '5\' 7"'$  (1.702 m)   Wt (!) 301 lb 9.6 oz (136.8 kg)   BMI 47.24 kg/m      Assessment & Plan:  Jasmine Peck comes in today with chief complaint of Leg Pain (LEFT LEG ON FEET A LOT GOES UP A STEP BEEN HURTING.)   Diagnosis and orders addressed:  1. Pain of left thigh Rest ROM exercises discussed Baclofen as needed- sedation precautions  No other NSAID's while taking voltaren Work note given RTO if symptoms worsen or do not improve - baclofen (LIORESAL) 10 MG tablet; Take 1 tablet (10 mg total) by mouth 3 (three) times daily.  Dispense: 30 each; Refill: 0 - diclofenac (VOLTAREN) 75 MG EC tablet; Take 1 tablet (75 mg total) by mouth 2 (two) times daily.  Dispense: 60 tablet; Refill: 2 - diclofenac Sodium (VOLTAREN) 1 % GEL; Apply 2 g topically 4 (four) times daily.  Dispense: 350 g; Refill: 2   [name],  FNP

## 2020-09-06 NOTE — Telephone Encounter (Signed)
Jasmine Peck Key: [id number] Rx #: [phone]  Sent to plan

## 2020-09-06 NOTE — Patient Instructions (Signed)
Muscle Pain, Adult Muscle pain, also called myalgia, is a condition in which a person has pain in one or more muscles in the body. Muscle pain may be mild, moderate, or severe. It may feel sharp, achy, or burning. In most cases, the pain lasts only a shorttime and goes away without treatment. Muscle pain can result from using muscles in a new or different way or after a period of inactivity. It is normal to feel some muscle pain after starting anexercise program. Muscles that have not been used often will be sore at first. What are the causes? This condition is caused by using muscles in a new or different way after a period of inactivity. Other causes may include: Overuse or muscle strain, especially if you are not in shape. This is the most common cause of muscle pain. Injury or bruising. Infectious diseases, including diseases caused by viruses, such as the flu (influenza). Fibromyalgia.This is a long-term, or chronic, condition that causes muscle tenderness, tiredness (fatigue), and headache. Autoimmune or rheumatologic diseases. These are conditions, such as lupus, in which the body's defense system (immunesystem) attacks areas in the body. Certain medicines, including ACE inhibitors and statins. What are the signs or symptoms? The main symptom of this condition is sore or painful muscles, including duringactivity and when stretching. You may also have slight swelling. How is this diagnosed? This condition is diagnosed with a physical exam. Your health care provider will ask questions about your pain and when it began. If you have not had muscle pain for very long, your health care provider may want to wait before doing much testing. If your muscle pain has lasted a long time, tests may be done right away. In some cases, this may include tests to rule out certainconditions or illnesses. How is this treated? Treatment for this condition depends on the cause. Home care is often enough to relieve  muscle pain. Your health care provider may also prescribe NSAIDs, suchas ibuprofen. Follow these instructions at home: Medicines Take over-the-counter and prescription medicines only as told by your health care provider. Ask your health care provider if the medicine prescribed to you requires you to avoid driving or using machinery. Managing pain, swelling, and discomfort     If directed, put ice on the painful area. To do this: Put ice in a plastic bag. Place a towel between your skin and the bag. Leave the ice on for 20 minutes, 2-3 times a day. For the first 2 days of muscle soreness, or if there is swelling: Do not soak in hot baths. Do not use a hot tub, steam room, sauna, heating pad, or other heat source. After 48-72 hours, you may alternate between applying ice and applying heat as told by your health care provider. If directed, apply heat to the affected area as often as told by your health care provider. Use the heat source that your health care provider recommends, such as a moist heat pack or a heating pad. Place a towel between your skin and the heat source. Leave the heat on for 20-30 minutes. Remove the heat if your skin turns bright red. This is especially important if you are unable to feel pain, heat, or cold. You may have a greater risk of getting burned. If you have an injury, raise (elevate) the injured area above the level of your heart while you are sitting or lying down. Activity  If overuse is causing your muscle pain: Slow down your activities until the pain  goes away. Do regular, gentle exercises if you are not usually active. Warm up before exercising. Stretch before and after exercising. This can help lower the risk of muscle pain. Do not continue working out if the pain is severe. Severe pain could mean that you have injured a muscle. Do not lift anything that is heavier than 5-10 lb (2.3-4.5 kg), or the limit that you are told, until your health care  provider says that it is safe. Return to your normal activities as told by your health care provider. Ask your health care provider what activities are safe for you.  General instructions Do not use any products that contain nicotine or tobacco, such as cigarettes, e-cigarettes, and chewing tobacco. These can delay healing. If you need help quitting, ask your health care provider. Keep all follow-up visits as told by your health care provider. This is important. Contact a health care provider if you have: Muscle pain that gets worse and medicines do not help. Muscle pain that lasts longer than 3 days. A rash or fever along with muscle pain. Muscle pain after a tick bite. Muscle pain while working out, even though you are in good physical condition. Redness, soreness, or swelling along with muscle pain. Muscle pain after starting a new medicine or changing the dose of a medicine. Get help right away if you have: Trouble breathing. Trouble swallowing. Muscle pain along with a stiff neck, fever, and vomiting. Severe muscle weakness or you cannot move part of your body. These symptoms may represent a serious problem that is an emergency. Do not wait to see if the symptoms will go away. Get medical help right away. Call your local emergency services (911 in the [place] Do not drive yourself to the hospital. Summary Muscle pain usually lasts only a short time and goes away without treatment. This condition is caused by using muscles in a new or different way after a period of inactivity. If your muscle pain lasts longer than 3 days, tell your health care provider. This information is not intended to replace advice given to you by your health care provider. Make sure you discuss any questions you have with your healthcare provider. Document Revised: 10/08/2018 Document Reviewed: 10/08/2018 Elsevier Patient Education  2022 [employer].

## 2020-09-13 ENCOUNTER — Other Ambulatory Visit: Payer: Self-pay | Admitting: Family

## 2020-09-13 DIAGNOSIS — L409 Psoriasis, unspecified: Secondary | ICD-10-CM

## 2020-10-04 ENCOUNTER — Other Ambulatory Visit: Payer: Self-pay | Admitting: Family Medicine

## 2020-10-04 DIAGNOSIS — E1142 Type 2 diabetes mellitus with diabetic polyneuropathy: Secondary | ICD-10-CM

## 2020-10-08 ENCOUNTER — Ambulatory Visit: Payer: Medicaid Other | Admitting: Family

## 2020-10-16 ENCOUNTER — Other Ambulatory Visit: Payer: Self-pay | Admitting: Family

## 2020-10-16 DIAGNOSIS — Z1231 Encounter for screening mammogram for malignant neoplasm of breast: Secondary | ICD-10-CM

## 2020-10-17 ENCOUNTER — Other Ambulatory Visit: Payer: Self-pay

## 2020-10-17 ENCOUNTER — Inpatient Hospital Stay (HOSPITAL_COMMUNITY): Payer: Medicaid Other | Attending: Hematology

## 2020-10-17 DIAGNOSIS — Z79899 Other long term (current) drug therapy: Secondary | ICD-10-CM | POA: Insufficient documentation

## 2020-10-17 DIAGNOSIS — F17210 Nicotine dependence, cigarettes, uncomplicated: Secondary | ICD-10-CM | POA: Diagnosis not present

## 2020-10-17 DIAGNOSIS — Z21 Asymptomatic human immunodeficiency virus [HIV] infection status: Secondary | ICD-10-CM | POA: Insufficient documentation

## 2020-10-17 DIAGNOSIS — L4050 Arthropathic psoriasis, unspecified: Secondary | ICD-10-CM | POA: Insufficient documentation

## 2020-10-17 DIAGNOSIS — D472 Monoclonal gammopathy: Secondary | ICD-10-CM | POA: Insufficient documentation

## 2020-10-17 LAB — CBC WITH DIFFERENTIAL/PLATELET
Abs Immature Granulocytes: 0.02 10*3/uL (ref 0.00–0.07)
Basophils Absolute: 0.1 10*3/uL (ref 0.0–0.1)
Basophils Relative: 1 %
Eosinophils Absolute: 0.1 10*3/uL (ref 0.0–0.5)
Eosinophils Relative: 2 %
HCT: 43.2 % (ref 36.0–46.0)
Hemoglobin: 13.7 g/dL (ref 12.0–15.0)
Immature Granulocytes: 0 %
Lymphocytes Relative: 38 %
Lymphs Abs: 2.9 10*3/uL (ref 0.7–4.0)
MCH: 33 pg (ref 26.0–34.0)
MCHC: 31.7 g/dL (ref 30.0–36.0)
MCV: 104.1 fL — ABNORMAL HIGH (ref 80.0–100.0)
Monocytes Absolute: 0.6 10*3/uL (ref 0.1–1.0)
Monocytes Relative: 8 %
Neutro Abs: 4 10*3/uL (ref 1.7–7.7)
Neutrophils Relative %: 51 %
Platelets: 310 10*3/uL (ref 150–400)
RBC: 4.15 MIL/uL (ref 3.87–5.11)
RDW: 12.7 % (ref 11.5–15.5)
WBC: 7.7 10*3/uL (ref 4.0–10.5)
nRBC: 0 % (ref 0.0–0.2)

## 2020-10-17 LAB — COMPREHENSIVE METABOLIC PANEL
ALT: 12 U/L (ref 0–44)
AST: 12 U/L — ABNORMAL LOW (ref 15–41)
Albumin: 3.9 g/dL (ref 3.5–5.0)
Alkaline Phosphatase: 74 U/L (ref 38–126)
Anion gap: 7 (ref 5–15)
BUN: 10 mg/dL (ref 6–20)
CO2: 25 mmol/L (ref 22–32)
Calcium: 8.8 mg/dL — ABNORMAL LOW (ref 8.9–10.3)
Chloride: 107 mmol/L (ref 98–111)
Creatinine, Ser: 0.73 mg/dL (ref 0.44–1.00)
GFR, Estimated: >60 mL/min (ref >60)
Glucose, Bld: 117 mg/dL — ABNORMAL HIGH (ref 70–99)
Potassium: 3.9 mmol/L (ref 3.5–5.1)
Sodium: 139 mmol/L (ref 135–145)
Total Bilirubin: 0.6 mg/dL (ref 0.3–1.2)
Total Protein: 7.4 g/dL (ref 6.5–8.1)

## 2020-10-17 LAB — LACTATE DEHYDROGENASE: LDH: 114 U/L (ref 98–192)

## 2020-10-18 LAB — KAPPA/LAMBDA LIGHT CHAINS
Kappa free light chain: 24.3 mg/L — ABNORMAL HIGH (ref 3.3–19.4)
Kappa, lambda light chain ratio: 1.96 — ABNORMAL HIGH (ref 0.26–1.65)
Lambda free light chains: 12.4 mg/L (ref 5.7–26.3)

## 2020-10-19 LAB — PROTEIN ELECTROPHORESIS, SERUM
A/G Ratio: 1.2 (ref 0.7–1.7)
Albumin ELP: 3.7 g/dL (ref 2.9–4.4)
Alpha-1-Globulin: 0.3 g/dL (ref 0.0–0.4)
Alpha-2-Globulin: 0.9 g/dL (ref 0.4–1.0)
Beta Globulin: 1.1 g/dL (ref 0.7–1.3)
Gamma Globulin: 1 g/dL (ref 0.4–1.8)
Globulin, Total: 3.2 g/dL (ref 2.2–3.9)
M-Spike, %: 0.4 g/dL — ABNORMAL HIGH
Total Protein ELP: 6.9 g/dL (ref 6.0–8.5)

## 2020-10-23 LAB — IMMUNOFIXATION ELECTROPHORESIS
IgA: 336 mg/dL (ref 87–352)
IgG (Immunoglobin G), Serum: 831 mg/dL (ref 586–1602)
IgM (Immunoglobulin M), Srm: 72 mg/dL (ref 26–217)
Total Protein ELP: 6.9 g/dL (ref 6.0–8.5)

## 2020-10-23 NOTE — Progress Notes (Signed)
Jasmine Peck, [place] [address]   CLINIC:  Medical Oncology/Hematology  PCP:  Sharion Balloon, [place] [address] [phone]   REASON FOR VISIT:  Follow-up for IgA kappa MGUS  PRIOR THERAPY: None  CURRENT THERAPY: Observation  INTERVAL HISTORY:  Jasmine Peck 45 y.o. female returns for routine follow-up of IgA kappa MGUS.  She was last seen on 04/24/2020 by Dr. Delton Coombes.  At today's visit, she reports feeling fairly well.  No recent hospitalizations, surgeries, or changes in baseline health status.  She denies any new bone pain or recent fractures. She denies any B symptoms such as fever, chills, night sweats, unintentional weight loss.   She has chronic neuropathy which is at baseline, but she denies any new neurologic symptoms such as tinnitus, new-onset hearing loss, blurred vision, headache, or dizziness.   No thromboembolic events since her last visit.  No new masses or lymphadenopathy per her report.  She has 70% energy and 100% appetite. She endorses that she is maintaining a stable weight.   REVIEW OF SYSTEMS:  Review of Systems  Constitutional:  Positive for fatigue. Negative for appetite change, chills, diaphoresis, fever and unexpected weight change.  HENT:   Negative for lump/mass and nosebleeds.   Eyes:  Negative for eye problems.  Respiratory:  Negative for cough, hemoptysis and shortness of breath.   Cardiovascular:  Negative for chest pain, leg swelling and palpitations.  Gastrointestinal:  Negative for abdominal pain, blood in stool, constipation, diarrhea, nausea and vomiting.  Genitourinary:  Positive for frequency and nocturia. Negative for hematuria.   Skin: Negative.   Neurological:  Negative for dizziness, headaches and light-headedness.  Hematological:  Does not bruise/bleed easily.  Psychiatric/Behavioral:  Positive for sleep disturbance.      PAST MEDICAL/SURGICAL HISTORY:  Past  Medical History:  Diagnosis Date   Anxiety    Depression    Diabetes mellitus without complication (HCC)    GERD (gastroesophageal reflux disease)    HIV infection ([place])    Hypertension    Psoriasis    per patient    Past Surgical History:  Procedure Laterality Date   CHOLECYSTECTOMY     DILATION AND CURETTAGE OF UTERUS     MOUTH SURGERY     all teeth have been removed      SOCIAL HISTORY:  Social History   Socioeconomic History   Marital status: Divorced    Spouse name: Not on file   Number of children: Not on file   Years of education: Not on file   Highest education level: Not on file  Occupational History   Not on file  Tobacco Use   Smoking status: Every Day    Packs/day: 0.50    Types: Cigarettes   Smokeless tobacco: Never  Vaping Use   Vaping Use: Never used  Substance and Sexual Activity   Alcohol use: No    Alcohol/week: 0.0 standard drinks   Drug use: No   Sexual activity: Never  Other Topics Concern   Not on file  Social History Narrative   Not on file   Social Determinants of Health   Financial Resource Strain: Low Risk    Difficulty of Paying Living Expenses: Not hard at all  Food Insecurity: No Food Insecurity   Worried About Charity fundraiser in the Last Year: Never true   Ran Out of Food in the Last Year: Never true  Transportation Needs: No Transportation Needs  Lack of Transportation (Medical): No   Lack of Transportation (Non-Medical): No  Physical Activity: Inactive   Days of Exercise per Week: 0 days   Minutes of Exercise per Session: 0 min  Stress: Stress Concern Present   Feeling of Stress : Rather much  Social Connections: Moderately Isolated   Frequency of Communication with Friends and Family: More than three times a week   Frequency of Social Gatherings with Friends and Family: Three times a week   Attends Religious Services: Never   Active Member of Clubs or Organizations: Yes   Attends Club or Organization Meetings:  Never   Marital Status: Never married  Intimate Partner Violence: Not At Risk   Fear of Current or Ex-Partner: No   Emotionally Abused: No   Physically Abused: No   Sexually Abused: No    FAMILY HISTORY:  Family History  Problem Relation Age of Onset   Heart disease Mother    Diabetes Mother    Hypertension Mother    Early death Father    Diabetes Father    Hypertension Father    Diabetes Brother    Hypertension Brother    Hyperlipidemia Brother    Heart disease Maternal Grandmother    Diabetes Maternal Grandmother    Diabetes Paternal Grandfather    Heart disease Paternal Grandfather    GER disease Daughter    Healthy Daughter    Healthy Daughter    Healthy Daughter    Asthma Daughter     CURRENT MEDICATIONS:  Outpatient Encounter Medications as of 10/24/2020  Medication Sig   amLODipine (NORVASC) 10 MG tablet Take 1 tablet (10 mg total) by mouth daily.   baclofen (LIORESAL) 10 MG tablet Take 1 tablet (10 mg total) by mouth 3 (three) times daily.   cetirizine (ZYRTEC) 10 MG tablet TAKE ONE TABLET BY MOUTH DAILY   clobetasol ointment (TEMOVATE) 0.05 % APPLY ONE APPLICATION TOPICALLY TWICE DAILY   diclofenac (VOLTAREN) 75 MG EC tablet Take 1 tablet (75 mg total) by mouth 2 (two) times daily.   diclofenac Sodium (VOLTAREN) 1 % GEL Apply 2 g topically 4 (four) times daily.   Dulaglutide (TRULICITY) 0.75 MG/0.5ML SOPN Inject 0.75 mg into the skin once a week.   fluticasone (FLONASE) 50 MCG/ACT nasal spray USE TWO SPRAYS IN EACH NOSTRIL EVERY DAY   furosemide (LASIX) 20 MG tablet Take 1 tablet (20 mg total) by mouth daily.   gabapentin (NEURONTIN) 600 MG tablet Take 1 tablet (600 mg total) by mouth 3 (three) times daily. (NEEDS TO BE SEEN BEFORE NEXT REFILL)   lisinopril (ZESTRIL) 20 MG tablet Take 1 tablet (20 mg total) by mouth daily.   omeprazole (PRILOSEC) 20 MG capsule TAKE ONE CAPSULE BY MOUTH EVERY DAY, NEEDS APPOINTMENT FOR REFILLS   OVER THE COUNTER MEDICATION     triamcinolone ointment (KENALOG) 0.5 % Apply 1 application topically 2 (two) times daily.   TRIUMEQ 600-50-300 MG tablet TAKE ONE TABLET BY MOUTH EVERY DAY   No facility-administered encounter medications on file as of 10/24/2020.    ALLERGIES:  Allergies  Allergen Reactions   Clindamycin/Lincomycin     rash   Erythromycin Other (See Comments)    Abdominal pain     PHYSICAL EXAM:  ECOG PERFORMANCE STATUS: 1 - Symptomatic but completely ambulatory  There were no vitals filed for this visit. There were no vitals filed for this visit. Physical Exam Constitutional:      Appearance: Normal appearance. She is morbidly obese.  HENT:       Head: Normocephalic and atraumatic.     Mouth/Throat:     Mouth: Mucous membranes are moist.  Eyes:     Extraocular Movements: Extraocular movements intact.     Pupils: Pupils are equal, round, and reactive to light.  Cardiovascular:     Rate and Rhythm: Normal rate and regular rhythm.     Pulses: Normal pulses.     Heart sounds: Normal heart sounds.  Pulmonary:     Effort: Pulmonary effort is normal.     Breath sounds: Normal breath sounds.  Abdominal:     General: Bowel sounds are normal.     Palpations: Abdomen is soft.     Tenderness: There is no abdominal tenderness.  Musculoskeletal:        General: No swelling.     Right lower leg: No edema.     Left lower leg: No edema.  Lymphadenopathy:     Cervical: No cervical adenopathy.  Skin:    General: Skin is warm and dry.  Neurological:     General: No focal deficit present.     Mental Status: She is alert and oriented to person, place, and time.  Psychiatric:        Mood and Affect: Mood normal.        Behavior: Behavior normal.     LABORATORY DATA:  I have reviewed the labs as listed.  CBC    Component Value Date/Time   WBC 7.7 10/17/2020 0814   RBC 4.15 10/17/2020 0814   HGB 13.7 10/17/2020 0814   HGB 13.6 07/06/2020 1533   HCT 43.2 10/17/2020 0814   HCT 39.4  07/06/2020 1533   PLT 310 10/17/2020 0814   PLT 328 07/06/2020 1533   MCV 104.1 (H) 10/17/2020 0814   MCV 95 07/06/2020 1533   MCH 33.0 10/17/2020 0814   MCHC 31.7 10/17/2020 0814   RDW 12.7 10/17/2020 0814   RDW 13.7 07/06/2020 1533   LYMPHSABS 2.9 10/17/2020 0814   LYMPHSABS 3.5 (H) 07/06/2020 1533   MONOABS 0.6 10/17/2020 0814   EOSABS 0.1 10/17/2020 0814   EOSABS 0.2 07/06/2020 1533   BASOSABS 0.1 10/17/2020 0814   BASOSABS 0.1 07/06/2020 1533   CMP Latest Ref Rng & Units 10/17/2020 07/06/2020 04/17/2020  Glucose 70 - 99 mg/dL 117(H) 119(H) 93  BUN 6 - 20 mg/dL 10 10 11  Creatinine 0.44 - 1.00 mg/dL 0.73 0.88 0.71  Sodium 135 - 145 mmol/L 139 139 140  Potassium 3.5 - 5.1 mmol/L 3.9 3.9 4.2  Chloride 98 - 111 mmol/L 107 102 105  CO2 22 - 32 mmol/L 25 21 26  Calcium 8.9 - 10.3 mg/dL 8.8(L) 9.4 8.7(L)  Total Protein 6.5 - 8.1 g/dL 7.4 6.5 6.8  Total Bilirubin 0.3 - 1.2 mg/dL 0.6 0.4 0.6  Alkaline Phos 38 - 126 U/L 74 82 58  AST 15 - 41 U/L 12(L) 7 13(L)  ALT 0 - 44 U/L 12 6 10    DIAGNOSTIC IMAGING:  I have independently reviewed the relevant imaging and discussed with the patient.  ASSESSMENT & PLAN: 1.   IgA kappa MGUS: - Discovered during work-up by rheumatology for psoriatic arthritis - Initial work-up showed SPEP with M spike 0.4 and immunofixation showing IgA kappa.  LDH, UPEP with reflex, beta-2 microglobulin and serum free light chains all within normal limits. - Most recent skeletal survey (01/12/2020): Negative for skeletal lytic lesions - Most recent labs (10/17/2020): SPEP with stable M spike 0.4.  Elevated kappa light chains 24.3,   normal lambda light chains 12.4, elevated kappa lambda ratio 1.96, slightly increased from previous.  LDH normal. - No CRAB features at this time: Hgb 13.7, creatinine 0.73, calcium 8.8 - She does not have any B symptoms, new bone pains.  - We discussed the normal pathophysiology of MGUS and 1% per year chance of transformation to  myeloma. - PLAN: Repeat labs and RTC in 6 months.  Will repeat skeletal survey prior to follow-up visit in 6 months.  We will consider bone marrow biopsy if she has any significant deviations from her baseline labs or develops any concerning B symptoms.  2.  Psoriatic arthritis: - Previously started on methotrexate and topical creams.  Unfortunately developed an anaphylactic reaction to methotrexate.  This was discontinued.   - PLAN: Continue follow-up with rheumatology  3.   HIV: - Followed by infectious disease every 6 months. - Well controlled-viral load undetectable per patient. - PLAN: Continue follow-up with infectious disease.  4.  Social history: - No family history of blood disorders or cancer per patient. - Distant family relatives had esophageal cancer. - Current everyday smoker for the past 25 years.  Approximately 1 pack/day. - She does not yet meet criteria for LDCT lung cancer screening due to age < 50   PLAN SUMMARY & DISPOSITION: -Labs and skeletal survey in 6 months - RTC after labs   All questions were answered. The patient knows to call the clinic with any problems, questions or concerns.  Medical decision making: Low  Time spent on visit: I spent 20 minutes counseling the patient face to face. The total time spent in the appointment was 30 minutes and more than 50% was on counseling.   Rebekah M Pennington, PA-C  10/24/2020 9:14 AM    

## 2020-10-24 ENCOUNTER — Other Ambulatory Visit: Payer: Self-pay

## 2020-10-24 ENCOUNTER — Inpatient Hospital Stay (HOSPITAL_COMMUNITY): Payer: Medicaid Other | Admitting: Physician Assistant

## 2020-10-24 VITALS — BP 148/95 | HR 75 | Temp 96.6°F | Resp 18 | Wt 308.6 lb

## 2020-10-24 DIAGNOSIS — D472 Monoclonal gammopathy: Secondary | ICD-10-CM | POA: Diagnosis not present

## 2020-10-24 NOTE — Patient Instructions (Signed)
[place] at [facility] Discharge Instructions  You were seen today by Tarri Abernethy PA-C for your MGUS/"monoclonal gammopathy of unknown significance."  This is the abnormal protein in your blood that we have discussed.  At this time, your MGUS and protein levels are stable and you do NOT show any signs of progression to cancer at this time.  However, because MGUS can in some cases be a precancerous state, we will continue to monitor you every 6 months for any major changes.  LABS: Return in 6 months for repeat labs  OTHER TESTS: Skeletal survey (whole-body x-rays) in 6 months  MEDICATIONS: No changes to home medications  FOLLOW-UP APPOINTMENT: Office visit in 6 months, after labs   Thank you for choosing [place] [facility] to provide your oncology and hematology care.  To afford each patient quality time with our provider, please arrive at least 15 minutes before your scheduled appointment time.   If you have a lab appointment with the [facility] please come in thru the Main Entrance and check in at the main information desk.  You need to re-schedule your appointment should you arrive 10 or more minutes late.  We strive to give you quality time with our providers, and arriving late affects you and other patients whose appointments are after yours.  Also, if you no show three or more times for appointments you may be dismissed from the clinic at the providers discretion.     Again, thank you for choosing [facility]  Our hope is that these requests will decrease the amount of time that you wait before being seen by our physicians.       _____________________________________________________________  Should you have questions after your visit to [facility], please contact our office at [phone] and follow the prompts.  Our office hours are 8:00 a.m. and 4:30 p.m. Monday - Friday.  Please note that  voicemails left after 4:00 p.m. may not be returned until the following business day.  We are closed weekends and major holidays.  You do have access to a nurse 24-7, just call the main number to the clinic [phone] and do not press any options, hold on the line and a nurse will answer the phone.    For prescription refill requests, have your pharmacy contact our office and allow 72 hours.    Due to Covid, you will need to wear a mask upon entering the hospital. If you do not have a mask, a mask will be given to you at the Main Entrance upon arrival. For doctor visits, patients may have 1 support person age 6 or older with them. For treatment visits, patients can not have anyone with them due to social distancing guidelines and our immunocompromised population.

## 2020-10-31 ENCOUNTER — Other Ambulatory Visit: Payer: Self-pay | Admitting: Family

## 2020-10-31 DIAGNOSIS — L409 Psoriasis, unspecified: Secondary | ICD-10-CM

## 2020-11-14 ENCOUNTER — Other Ambulatory Visit: Payer: Self-pay | Admitting: Family Medicine

## 2020-11-14 DIAGNOSIS — E1142 Type 2 diabetes mellitus with diabetic polyneuropathy: Secondary | ICD-10-CM

## 2020-11-14 NOTE — Telephone Encounter (Signed)
[facility] NTBS 30 days given 10/04/20

## 2020-12-03 ENCOUNTER — Ambulatory Visit: Payer: Medicaid Other

## 2020-12-07 ENCOUNTER — Other Ambulatory Visit: Payer: Self-pay | Admitting: Family

## 2020-12-07 DIAGNOSIS — L409 Psoriasis, unspecified: Secondary | ICD-10-CM

## 2020-12-28 ENCOUNTER — Ambulatory Visit
Admission: RE | Admit: 2020-12-28 | Discharge: 2020-12-28 | Disposition: A | Discharge status: Home / Self Care | Payer: Medicaid Other | Source: Ambulatory Visit | Attending: Family | Admitting: Family

## 2020-12-28 ENCOUNTER — Ambulatory Visit: Payer: Medicaid Other

## 2020-12-28 DIAGNOSIS — Z1231 Encounter for screening mammogram for malignant neoplasm of breast: Secondary | ICD-10-CM | POA: Diagnosis not present

## 2020-12-28 DIAGNOSIS — R197 Diarrhea, unspecified: Secondary | ICD-10-CM | POA: Diagnosis not present

## 2021-01-31 ENCOUNTER — Ambulatory Visit: Admit: 2021-01-31 | Discharge: 2021-02-01 | Payer: BLUE CROSS/BLUE SHIELD | Attending: Family | Primary: Family

## 2021-01-31 DIAGNOSIS — J029 Acute pharyngitis, unspecified: Principal | ICD-10-CM

## 2021-01-31 DIAGNOSIS — J329 Chronic sinusitis, unspecified: Principal | ICD-10-CM

## 2021-01-31 DIAGNOSIS — R059 Cough, unspecified type: Principal | ICD-10-CM

## 2021-01-31 MED ORDER — DOXYCYCLINE HYCLATE 100 MG TABLET
ORAL_TABLET | Freq: Two times a day (BID) | ORAL | 0 refills | 10 days | Status: CP
Start: 2021-01-31 — End: 2021-02-10

## 2021-01-31 MED ORDER — PREDNISONE 5 MG TABLET
ORAL_TABLET | 0 refills | 0 days | Status: CP
Start: 2021-01-31 — End: ?

## 2021-02-05 ENCOUNTER — Other Ambulatory Visit: Payer: Self-pay | Admitting: Family

## 2021-02-28 DIAGNOSIS — Z124 Encounter for screening for malignant neoplasm of cervix: Secondary | ICD-10-CM | POA: Diagnosis not present

## 2021-02-28 DIAGNOSIS — B20 Human immunodeficiency virus [HIV] disease: Secondary | ICD-10-CM | POA: Diagnosis not present

## 2021-02-28 DIAGNOSIS — E119 Type 2 diabetes mellitus without complications: Secondary | ICD-10-CM | POA: Diagnosis not present

## 2021-03-20 DIAGNOSIS — B20 Human immunodeficiency virus [HIV] disease: Principal | ICD-10-CM

## 2021-03-30 ENCOUNTER — Other Ambulatory Visit: Payer: Self-pay | Admitting: Family

## 2021-04-03 ENCOUNTER — Other Ambulatory Visit: Payer: Self-pay | Admitting: Family

## 2021-04-03 DIAGNOSIS — E1142 Type 2 diabetes mellitus with diabetic polyneuropathy: Secondary | ICD-10-CM

## 2021-04-04 DIAGNOSIS — B20 Human immunodeficiency virus [HIV] disease: Secondary | ICD-10-CM | POA: Diagnosis not present

## 2021-04-24 ENCOUNTER — Inpatient Hospital Stay (HOSPITAL_COMMUNITY): Payer: Medicaid Other | Attending: Hematology

## 2021-04-24 ENCOUNTER — Ambulatory Visit (HOSPITAL_COMMUNITY)
Admission: RE | Admit: 2021-04-24 | Discharge: 2021-04-24 | Disposition: A | Discharge status: Home / Self Care | Payer: Medicaid Other | Source: Ambulatory Visit | Attending: Physician Assistant | Admitting: Physician Assistant

## 2021-04-24 DIAGNOSIS — D472 Monoclonal gammopathy: Secondary | ICD-10-CM | POA: Insufficient documentation

## 2021-04-24 DIAGNOSIS — L4050 Arthropathic psoriasis, unspecified: Secondary | ICD-10-CM | POA: Insufficient documentation

## 2021-04-24 DIAGNOSIS — Z79899 Other long term (current) drug therapy: Secondary | ICD-10-CM | POA: Insufficient documentation

## 2021-04-24 DIAGNOSIS — R9389 Abnormal findings on diagnostic imaging of other specified body structures: Secondary | ICD-10-CM | POA: Diagnosis not present

## 2021-04-24 DIAGNOSIS — F17210 Nicotine dependence, cigarettes, uncomplicated: Secondary | ICD-10-CM | POA: Insufficient documentation

## 2021-04-24 DIAGNOSIS — Z21 Asymptomatic human immunodeficiency virus [HIV] infection status: Secondary | ICD-10-CM | POA: Insufficient documentation

## 2021-04-24 LAB — COMPREHENSIVE METABOLIC PANEL
ALT: 11 U/L (ref 0–44)
AST: 13 U/L — ABNORMAL LOW (ref 15–41)
Albumin: 3.7 g/dL (ref 3.5–5.0)
Alkaline Phosphatase: 70 U/L (ref 38–126)
Anion gap: 6 (ref 5–15)
BUN: 9 mg/dL (ref 6–20)
CO2: 26 mmol/L (ref 22–32)
Calcium: 8.6 mg/dL — ABNORMAL LOW (ref 8.9–10.3)
Chloride: 107 mmol/L (ref 98–111)
Creatinine, Ser: 0.75 mg/dL (ref 0.44–1.00)
GFR, Estimated: >60 mL/min (ref >60)
Glucose, Bld: 104 mg/dL — ABNORMAL HIGH (ref 70–99)
Potassium: 3.8 mmol/L (ref 3.5–5.1)
Sodium: 139 mmol/L (ref 135–145)
Total Bilirubin: 0.6 mg/dL (ref 0.3–1.2)
Total Protein: 7.1 g/dL (ref 6.5–8.1)

## 2021-04-24 LAB — CBC WITH DIFFERENTIAL/PLATELET
Abs Immature Granulocytes: 0.01 10*3/uL (ref 0.00–0.07)
Basophils Absolute: 0.1 10*3/uL (ref 0.0–0.1)
Basophils Relative: 1 %
Eosinophils Absolute: 0.1 10*3/uL (ref 0.0–0.5)
Eosinophils Relative: 1 %
HCT: 40.8 % (ref 36.0–46.0)
Hemoglobin: 13 g/dL (ref 12.0–15.0)
Immature Granulocytes: 0 %
Lymphocytes Relative: 36 %
Lymphs Abs: 2.9 10*3/uL (ref 0.7–4.0)
MCH: 31.7 pg (ref 26.0–34.0)
MCHC: 31.9 g/dL (ref 30.0–36.0)
MCV: 99.5 fL (ref 80.0–100.0)
Monocytes Absolute: 0.6 10*3/uL (ref 0.1–1.0)
Monocytes Relative: 7 %
Neutro Abs: 4.6 10*3/uL (ref 1.7–7.7)
Neutrophils Relative %: 55 %
Platelets: 325 10*3/uL (ref 150–400)
RBC: 4.1 MIL/uL (ref 3.87–5.11)
RDW: 13.3 % (ref 11.5–15.5)
WBC: 8.1 10*3/uL (ref 4.0–10.5)
nRBC: 0 % (ref 0.0–0.2)

## 2021-04-24 LAB — LACTATE DEHYDROGENASE: LDH: 120 U/L (ref 98–192)

## 2021-04-25 LAB — PROTEIN ELECTROPHORESIS, SERUM
A/G Ratio: 1.2 (ref 0.7–1.7)
Albumin ELP: 3.5 g/dL (ref 2.9–4.4)
Alpha-1-Globulin: 0.3 g/dL (ref 0.0–0.4)
Alpha-2-Globulin: 0.8 g/dL (ref 0.4–1.0)
Beta Globulin: 1 g/dL (ref 0.7–1.3)
Gamma Globulin: 0.9 g/dL (ref 0.4–1.8)
Globulin, Total: 3 g/dL (ref 2.2–3.9)
M-Spike, %: 0.4 g/dL — ABNORMAL HIGH
Total Protein ELP: 6.5 g/dL (ref 6.0–8.5)

## 2021-04-25 LAB — KAPPA/LAMBDA LIGHT CHAINS
Kappa free light chain: 20.9 mg/L — ABNORMAL HIGH (ref 3.3–19.4)
Kappa, lambda light chain ratio: 1.99 — ABNORMAL HIGH (ref 0.26–1.65)
Lambda free light chains: 10.5 mg/L (ref 5.7–26.3)

## 2021-04-26 ENCOUNTER — Emergency Department
Admit: 2021-04-26 | Discharge: 2021-04-27 | Disposition: A | Discharge status: Home / Self Care | Payer: BLUE CROSS/BLUE SHIELD

## 2021-04-26 ENCOUNTER — Ambulatory Visit
Admit: 2021-04-26 | Discharge: 2021-04-27 | Disposition: A | Discharge status: Home / Self Care | Payer: BLUE CROSS/BLUE SHIELD

## 2021-04-26 DIAGNOSIS — M545 Acute bilateral low back pain without sciatica: Principal | ICD-10-CM

## 2021-04-26 DIAGNOSIS — R109 Unspecified abdominal pain: Secondary | ICD-10-CM | POA: Diagnosis not present

## 2021-04-29 ENCOUNTER — Telehealth: Payer: Self-pay

## 2021-04-29 ENCOUNTER — Other Ambulatory Visit: Payer: Self-pay | Admitting: Family

## 2021-04-29 NOTE — Telephone Encounter (Signed)
Transition Care Management Unsuccessful Follow-up Telephone Call ? ?Date of discharge and from where:  04/26/2021 from [facility]. ? ?Attempts:  1st Attempt ? ?Reason for unsuccessful TCM follow-up call:  Left voice message ? ? ? ?

## 2021-04-30 NOTE — Progress Notes (Signed)
? ?[facility] ?[address] ?[place], [place] [address] ? ? ?CLINIC:  ?Medical Oncology/Hematology ? ?PCP:  ?Sharion Balloon, FNP ?[address] ?[place] [address] ?[phone] ? ? ?REASON FOR VISIT:  ?Follow-up for MGUS ? ?PRIOR THERAPY: None ? ?CURRENT THERAPY: Surveillance ? ?INTERVAL HISTORY:  ?Ms. Twilley 46 y.o. 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  She was last seen by Tarri Abernethy PA-C on 10/24/2020. ? ?At today's visit, she reports feeling fairly well.  No recent hospitalizations, surgeries, or changes in baseline health status. ? ?She has chronic fatigue, which she says is work-related. ?She denies any new bone pain or recent fractures. ?She denies any B symptoms such as fever, chills, night sweats, unintentional weight loss.Marland Kitchen   ?No new neurologic symptoms such as tinnitus, new-onset hearing loss, blurred vision, headache, or dizziness.  Denies any numbness or tingling in hands or feet. ?No thromboembolic events since her last visit.  ?No new masses or lymphadenopathy per her report. ? ?Regarding x-ray findings of possible submandibular calcifications, she denies any jaw pain, difficulty swallowing, or lumps underneath her tongue. ? ?She has 60% energy and 100% appetite. She endorses that she is maintaining a stable weight. ? ? ?REVIEW OF SYSTEMS:  ?Review of Systems  ?Constitutional:  Positive for fatigue. Negative for appetite change, chills, diaphoresis, fever and unexpected weight change.  ?HENT:   Negative for lump/mass and nosebleeds.   ?Eyes:  Negative for eye problems.  ?Respiratory:  Negative for cough, hemoptysis and shortness of breath.   ?Cardiovascular:  Negative for chest pain, leg swelling and palpitations.  ?Gastrointestinal:  Negative for abdominal pain, blood in stool, constipation, diarrhea, nausea and vomiting.  ?Genitourinary:  Negative for hematuria.   ?Skin: Negative.   ?Neurological:  Negative for dizziness, headaches and light-headedness.   ?Hematological:  Does not bruise/bleed easily.   ? ? ?PAST MEDICAL/SURGICAL HISTORY:  ?Past Medical History:  ?Diagnosis Date  ? Anxiety   ? Depression   ? Diabetes mellitus without complication ([place])   ? GERD (gastroesophageal reflux disease)   ? HIV infection ([place])   ? Hypertension   ? Psoriasis   ? per patient   ? ?Past Surgical History:  ?Procedure Laterality Date  ? CHOLECYSTECTOMY    ? DILATION AND CURETTAGE OF UTERUS    ? MOUTH SURGERY    ? all teeth have been removed   ? ? ? ?SOCIAL HISTORY:  ?Social History  ? ?Socioeconomic History  ? Marital status: Divorced  ?  Spouse name: Not on file  ? Number of children: Not on file  ? Years of education: Not on file  ? Highest education level: Not on file  ?Occupational History  ? Not on file  ?Tobacco Use  ? Smoking status: Every Day  ?  Packs/day: 0.50  ?  Types: Cigarettes  ? Smokeless tobacco: Never  ?Vaping Use  ? Vaping Use: Never used  ?Substance and Sexual Activity  ? Alcohol use: No  ?  Alcohol/week: 0.0 standard drinks  ? Drug use: No  ? Sexual activity: Never  ?Other Topics Concern  ? Not on file  ?Social History Narrative  ? Not on file  ? ?Social Determinants of Health  ? ?Financial Resource Strain: Not on file  ?Food Insecurity: Not on file  ?Transportation Needs: Not on file  ?Physical Activity: Not on file  ?Stress: Not on file  ?Social Connections: Not on file  ?Intimate Partner Violence: Not on file  ? ? ?  FAMILY HISTORY:  ?Family History  ?Problem Relation Age of Onset  ? Heart disease Mother   ? Diabetes Mother   ? Hypertension Mother   ? Early death Father   ? Diabetes Father   ? Hypertension Father   ? Diabetes Brother   ? Hypertension Brother   ? Hyperlipidemia Brother   ? Heart disease Maternal Grandmother   ? Diabetes Maternal Grandmother   ? Diabetes Paternal Grandfather   ? Heart disease Paternal Grandfather   ? GER disease Daughter   ? Healthy Daughter   ? Healthy Daughter   ? Healthy Daughter   ? Asthma Daughter   ? ? ?CURRENT  MEDICATIONS:  ?Outpatient Encounter Medications as of 05/01/2021  ?Medication Sig  ? amLODipine (NORVASC) 10 MG tablet Take 1 tablet (10 mg total) by mouth daily.  ? baclofen (LIORESAL) 10 MG tablet Take 1 tablet (10 mg total) by mouth 3 (three) times daily.  ? cetirizine (ZYRTEC) 10 MG tablet TAKE ONE TABLET BY MOUTH DAILY  ? clobetasol ointment (TEMOVATE) 6.33 % APPLY ONE APPLICATION TOPICALLY TWICE DAILY  ? diclofenac (VOLTAREN) 75 MG EC tablet Take 1 tablet (75 mg total) by mouth 2 (two) times daily.  ? diclofenac Sodium (VOLTAREN) 1 % GEL Apply 2 g topically 4 (four) times daily.  ? fluticasone (FLONASE) 50 MCG/ACT nasal spray USE TWO SPRAYS IN EACH NOSTRIL EVERY DAY  ? furosemide (LASIX) 20 MG tablet Take 1 tablet (20 mg total) by mouth daily. (NEEDS TO BE SEEN BEFORE NEXT REFILL)  ? gabapentin (NEURONTIN) 600 MG tablet Take 1 tablet (600 mg total) by mouth 3 (three) times daily. (NEEDS TO BE SEEN BEFORE NEXT REFILL) (Patient not taking: Reported on 10/24/2020)  ? lisinopril (ZESTRIL) 20 MG tablet Take 1 tablet (20 mg total) by mouth daily.  ? omeprazole (PRILOSEC) 20 MG capsule TAKE ONE CAPSULE BY MOUTH EVERY DAY, NEEDS APPOINTMENT FOR REFILLS  ? OVER THE COUNTER MEDICATION   ? triamcinolone ointment (KENALOG) 0.5 % APPLY ONE APPLICATION TOPICALLY TWICE DAILY  ? TRIUMEQ 600-50-300 MG tablet TAKE ONE TABLET BY MOUTH EVERY DAY  ? ?No facility-administered encounter medications on file as of 05/01/2021.  ? ? ?ALLERGIES:  ?Allergies  ?Allergen Reactions  ? Clindamycin/Lincomycin   ?  rash  ? Erythromycin Other (See Comments)  ?  Abdominal pain  ? ? ? ?PHYSICAL EXAM:  ?ECOG PERFORMANCE STATUS: 1 - Symptomatic but completely ambulatory ? ?There were no vitals filed for this visit. ?There were no vitals filed for this visit. ?Physical Exam ?Constitutional:   ?   Appearance: Normal appearance. She is morbidly obese.  ?HENT:  ?   Head: Normocephalic and atraumatic.  ?   Mouth/Throat:  ?   Mouth: Mucous membranes are  moist.  ?Eyes:  ?   Extraocular Movements: Extraocular movements intact.  ?   Pupils: Pupils are equal, round, and reactive to light.  ?Cardiovascular:  ?   Rate and Rhythm: Normal rate and regular rhythm.  ?   Pulses: Normal pulses.  ?   Heart sounds: Normal heart sounds.  ?Pulmonary:  ?   Effort: Pulmonary effort is normal.  ?   Breath sounds: Normal breath sounds.  ?Abdominal:  ?   General: Bowel sounds are normal.  ?   Palpations: Abdomen is soft.  ?   Tenderness: There is no abdominal tenderness.  ?Musculoskeletal:     ?   General: No swelling.  ?   Right lower leg: No edema.  ?  Left lower leg: No edema.  ?Lymphadenopathy:  ?   Cervical: No cervical adenopathy.  ?Skin: ?   General: Skin is warm and dry.  ?   Findings: Rash (psoriasis) present.  ?Neurological:  ?   General: No focal deficit present.  ?   Mental Status: She is alert and oriented to person, place, and time.  ?Psychiatric:     ?   Mood and Affect: Mood normal.     ?   Behavior: Behavior normal.  ? ? ? ?LABORATORY DATA:  ?I have reviewed the labs as listed.  ?CBC ?   ?Component Value Date/Time  ? WBC 8.1 04/24/2021 0951  ? RBC 4.10 04/24/2021 0951  ? HGB 13.0 04/24/2021 0951  ? HGB 13.6 07/06/2020 1533  ? HCT 40.8 04/24/2021 0951  ? HCT 39.4 07/06/2020 1533  ? PLT 325 04/24/2021 0951  ? PLT 328 07/06/2020 1533  ? MCV 99.5 04/24/2021 0951  ? MCV 95 07/06/2020 1533  ? MCH 31.7 04/24/2021 0951  ? MCHC 31.9 04/24/2021 0951  ? RDW 13.3 04/24/2021 0951  ? RDW 13.7 07/06/2020 1533  ? LYMPHSABS 2.9 04/24/2021 0951  ? LYMPHSABS 3.5 (H) 07/06/2020 1533  ? MONOABS 0.6 04/24/2021 0951  ? EOSABS 0.1 04/24/2021 0951  ? EOSABS 0.2 07/06/2020 1533  ? BASOSABS 0.1 04/24/2021 0951  ? BASOSABS 0.1 07/06/2020 1533  ? ? ?  Latest Ref Rng & Units 04/24/2021  ?  9:51 AM 10/17/2020  ?  8:14 AM 07/06/2020  ?  3:33 PM  ?CMP  ?Glucose 70 - 99 mg/dL 104   117   119    ?BUN 6 - 20 mg/dL '9   10   10    '$ ?Creatinine 0.44 - 1.00 mg/dL 0.75   0.73   0.88    ?Sodium 135 - 145 mmol/L  139   139   139    ?Potassium 3.5 - 5.1 mmol/L 3.8   3.9   3.9    ?Chloride 98 - 111 mmol/L 107   107   102    ?CO2 22 - 32 mmol/L '26   25   21    '$ ?Calcium 8.9 - 10.3 mg/dL 8.6   8.8   9.4    ?Total Protein 6.5 - 8.

## 2021-04-30 NOTE — Telephone Encounter (Signed)
Transition Care Management Follow-up Telephone Call ?Date of discharge and from where: 04/26/2021 from [facility] ?How have you been since you were released from the hospital? Patient stated that she is feeling well and did not have any questions or concerns at this time.  ?Any questions or concerns? No ? ?Items Reviewed: ?Did the pt receive and understand the discharge instructions provided? Yes  ?Medications obtained and verified? Yes  ?Other? No  ?Any new allergies since your discharge? No  ?Dietary orders reviewed? No ?Do you have support at home? Yes  ? ?Functional Questionnaire: (I = Independent and D = Dependent) ?ADLs: I ? ?Bathing/Dressing- I ? ?Meal Prep- I ? ?Eating- I ? ?Maintaining continence- I ? ?Transferring/Ambulation- I ? ?Managing Meds- I ? ? ?Follow up appointments reviewed: ? ?PCP Hospital f/u appt confirmed? No   ?Specialist Hospital f/u appt confirmed? Yes  Scheduled to see [place] on 05/01/2021 @ 11:00am. ?Are transportation arrangements needed? No  ?If their condition worsens, is the pt aware to call PCP or go to the Emergency Dept.? Yes ?Was the patient provided with contact information for the PCP's office or ED? Yes ?Was to pt encouraged to call back with questions or concerns? Yes ? ?

## 2021-05-01 ENCOUNTER — Inpatient Hospital Stay (HOSPITAL_BASED_OUTPATIENT_CLINIC_OR_DEPARTMENT_OTHER): Payer: Medicaid Other | Admitting: Physician Assistant

## 2021-05-01 VITALS — BP 141/86 | HR 65 | Temp 98.0°F | Resp 16 | Ht 67.0 in | Wt 310.2 lb

## 2021-05-01 DIAGNOSIS — R9389 Abnormal findings on diagnostic imaging of other specified body structures: Secondary | ICD-10-CM | POA: Diagnosis not present

## 2021-05-01 DIAGNOSIS — R220 Localized swelling, mass and lump, head: Secondary | ICD-10-CM

## 2021-05-01 DIAGNOSIS — R221 Localized swelling, mass and lump, neck: Secondary | ICD-10-CM

## 2021-05-01 DIAGNOSIS — D472 Monoclonal gammopathy: Secondary | ICD-10-CM

## 2021-05-01 NOTE — Patient Instructions (Signed)
[place] at [facility] ?Discharge Instructions ? ?You were seen today by Tarri Abernethy PA-C for your MGUS/"monoclonal gammopathy of unknown significance."  This is the abnormal protein in your blood that we have discussed. ? ?At this time, your MGUS protein levels are stable and you do NOT show any signs of progression to cancer at this time.  We will recheck these labs again in 6 months. ? ?Your x-ray did show some very faint possible spots on your upper arm bones, so we will repeat x-rays of your arms in 6 months when you come back for follow-up visit and labs. ? ?Your x-ray also showed some abnormalities beneath your jaw (near your submandibular glands).  We will check a CT scan of your face/neck to get a better look at these abnormalities.  We will call you to discuss these results. ? ?LABS: Return in 6 months for repeat labs ? ?OTHER TESTS: ?- CT scan of your face/neck ?- X-ray of your arms in 6 months  ? ?MEDICATIONS: No changes to home medications ? ?FOLLOW-UP APPOINTMENT: ?- Phone visit after CT scan to discuss results ?- Office visit in 6 months, after labs and x-ray ? ? ?Thank you for choosing [place] at [facility] to provide your oncology and hematology care.  To afford each patient quality time with our provider, please arrive at least 15 minutes before your scheduled appointment time.  ? ?If you have a lab appointment with the [facility] please come in thru the Main Entrance and check in at the main information desk. ? ?You need to re-schedule your appointment should you arrive 10 or more minutes late.  We strive to give you quality time with our providers, and arriving late affects you and other patients whose appointments are after yours.  Also, if you no show three or more times for appointments you may be dismissed from the clinic at the providers discretion.     ?Again, thank you for choosing [facility].  Our hope is that  these requests will decrease the amount of time that you wait before being seen by our physicians.       ?_____________________________________________________________ ? ?Should you have questions after your visit to [facility], please contact our office at [phone] and follow the prompts.  Our office hours are 8:00 a.m. and 4:30 p.m. Monday - Friday.  Please note that voicemails left after 4:00 p.m. may not be returned until the following business day.  We are closed weekends and major holidays.  You do have access to a nurse 24-7, just call the main number to the clinic [phone] and do not press any options, hold on the line and a nurse will answer the phone.   ? ?For prescription refill requests, have your pharmacy contact our office and allow 72 hours.   ? ?Due to Covid, you will need to wear a mask upon entering the hospital. If you do not have a mask, a mask will be given to you at the Main Entrance upon arrival. For doctor visits, patients may have 1 support person age 46 or older with them. For treatment visits, patients can not have anyone with them due to social distancing guidelines and our immunocompromised population.  ? ? ? ?

## 2021-05-09 ENCOUNTER — Other Ambulatory Visit: Payer: Self-pay | Admitting: Family

## 2021-05-09 DIAGNOSIS — L409 Psoriasis, unspecified: Secondary | ICD-10-CM

## 2021-05-09 DIAGNOSIS — K219 Gastro-esophageal reflux disease without esophagitis: Secondary | ICD-10-CM

## 2021-05-09 NOTE — Telephone Encounter (Signed)
Last chronic check up- 07/06/20 ?Apt - 06/04/21 ?Advise on refill  ?

## 2021-05-09 NOTE — Telephone Encounter (Signed)
LOV- 07/06/20 ?03/15/20 #90 R-4 ?Needs appointment  ?

## 2021-05-09 NOTE — Telephone Encounter (Signed)
Appt made for 5/23 ?

## 2021-05-13 ENCOUNTER — Ambulatory Visit (HOSPITAL_BASED_OUTPATIENT_CLINIC_OR_DEPARTMENT_OTHER)
Admission: RE | Admit: 2021-05-13 | Discharge: 2021-05-13 | Disposition: A | Discharge status: Home / Self Care | Payer: Medicaid Other | Source: Ambulatory Visit | Attending: Physician Assistant | Admitting: Physician Assistant

## 2021-05-13 ENCOUNTER — Other Ambulatory Visit (HOSPITAL_COMMUNITY): Payer: Self-pay | Admitting: *Deleted

## 2021-05-13 DIAGNOSIS — R221 Localized swelling, mass and lump, neck: Secondary | ICD-10-CM | POA: Insufficient documentation

## 2021-05-13 DIAGNOSIS — R220 Localized swelling, mass and lump, head: Secondary | ICD-10-CM | POA: Diagnosis not present

## 2021-05-13 DIAGNOSIS — R9389 Abnormal findings on diagnostic imaging of other specified body structures: Secondary | ICD-10-CM | POA: Insufficient documentation

## 2021-05-13 DIAGNOSIS — K118 Other diseases of salivary glands: Secondary | ICD-10-CM | POA: Diagnosis not present

## 2021-05-13 MED ORDER — IOHEXOL 300 MG/ML  SOLN
75.0000 mL | Freq: Once | INTRAMUSCULAR | Status: AC | PRN
  Administered 2021-05-13: 75 mL via INTRAVENOUS

## 2021-05-17 NOTE — Progress Notes (Signed)
? ?Virtual Visit via Telephone Note ?[place] ? ?I connected with Jasmine Peck  on 05/20/21 at  9:42 AM by telephone and verified that I am speaking with the correct person using two identifiers. ? ?Location: ?Patient: Home ?Provider: [facility] ?  ?I discussed the limitations, risks, security and privacy concerns of performing an evaluation and management service by telephone and the availability of in person appointments. I also discussed with the patient that there may be a patient responsible charge related to this service. The patient expressed understanding and agreed to proceed. ? ?REASON FOR VISIT: Follow-up of CT scan results for abnormal x-ray ? ?INTERVAL HISTORY: ?[name] is contacted today for follow-up of her abnormal imaging studies.  She was last seen in office on 05/01/2021 by Tarri Abernethy PA-C. ? ?She follows at our clinic for MGUS, and her recent skeletal survey (04/24/2021) showed calcifications anterior to the cervical spine over the region of the submandibular glands, with calcified submandibular process including malignancy unable to be excluded.  Maxillofacial CT scan results as below, likely representing tonsil stones in the patient's chronically enlarged palatine tonsils.   ? ?She does report that she has some persistent sore throat, but after a recent episode of some nausea and vomiting, she thinks that she might have vomited up some of her tonsil stones, and reports that her throat has been feeling better since that time.  She denies any halitosis, bad taste in her mouth, or earaches.  She does have some persistent cough, but thinks that this may be from her lisinopril.  She has previously seen ENT for her enlarged tonsils in the past. ? ?  ?OBSERVATIONS/OBJECTIVE: ?Review of Systems  ?Constitutional:  Negative for chills, diaphoresis, fever, malaise/fatigue and weight loss.  ?Respiratory:  Negative for cough and shortness of breath.    ?Cardiovascular:  Negative for chest pain and palpitations.  ?Gastrointestinal:  Negative for abdominal pain, blood in stool, melena, nausea and vomiting.  ?Musculoskeletal:  Positive for back pain.  ?Neurological:  Negative for dizziness and headaches.   ? ?PHYSICAL EXAM (per limitations of virtual telephone visit): The patient is alert and oriented x 3, exhibiting adequate mentation, good mood, and ability to speak in full sentences and execute sound judgement. ? ? ?ASSESSMENT & PLAN: ?1.  Possible calcification of submandibular glands on x-ray ?- Skeletal survey (04/24/2021) revealed cervical spine calcifications over the region of the submandibular glands; calcified submandibular process including malignancy unable to be excluded. ?- No jaw pain, difficulty swallowing, or lumps underneath her tongue.   ?- Symptomatic tonsil stones with some ongoing sore throat ?- Physical exam did not reveal any abnormalities when she was seen in office on 05/01/2021 ?- She smokes 1 pack/day cigarettes x25 years ?- Maxillofacial CT scan (05/13/2021): Radiographic abnormality correlates with calcifications within the enlarged palatine tonsils; negative for mass or salivary calculus; calcifications correlate with bilateral palatine tonsillitis. ?- Discussed with patient that these findings are benign, ?- PLAN: Discussed with patient that these findings are benign, but if they are bothersome or symptomatic, she can see her dentist or be referred to ENT. ? ?2.  MGUS ?- Not addressed during this visit, but was addressed during recent office visit on 05/01/2021 ?- PLAN: Follow-up as already scheduled in 6 months for repeat humerus x-rays and MGUS panel followed by office visit. ?  ?I discussed the assessment and treatment plan with the patient. The patient was provided an opportunity to ask questions and all were  answered. The patient agreed with the plan and demonstrated an understanding of the instructions. ?  ?The patient was advised  to call back or seek an in-person evaluation if the symptoms worsen or if the condition fails to improve as anticipated. ? ?I provided 7 minutes of non-face-to-face time during this encounter. ? ?Harriett Rush, PA-C ?05/20/2021 9:49 AM ?

## 2021-05-20 ENCOUNTER — Inpatient Hospital Stay (HOSPITAL_COMMUNITY): Payer: Medicaid Other | Attending: Hematology | Admitting: Physician Assistant

## 2021-05-20 ENCOUNTER — Other Ambulatory Visit (HOSPITAL_COMMUNITY): Payer: Self-pay

## 2021-05-20 DIAGNOSIS — B977 Papillomavirus as the cause of diseases classified elsewhere: Principal | ICD-10-CM

## 2021-05-20 DIAGNOSIS — R87619 Unspecified abnormal cytological findings in specimens from cervix uteri: Principal | ICD-10-CM

## 2021-05-20 DIAGNOSIS — R9389 Abnormal findings on diagnostic imaging of other specified body structures: Secondary | ICD-10-CM

## 2021-05-20 DIAGNOSIS — J358 Other chronic diseases of tonsils and adenoids: Secondary | ICD-10-CM | POA: Diagnosis not present

## 2021-05-22 ENCOUNTER — Ambulatory Visit
Admit: 2021-05-22 | Discharge: 2021-05-22 | Payer: BLUE CROSS/BLUE SHIELD | Attending: Student in an Organized Health Care Education/Training Program | Primary: Student in an Organized Health Care Education/Training Program

## 2021-05-22 ENCOUNTER — Other Ambulatory Visit: Admit: 2021-05-22 | Discharge: 2021-05-22 | Payer: BLUE CROSS/BLUE SHIELD

## 2021-05-22 DIAGNOSIS — Z79899 Other long term (current) drug therapy: Principal | ICD-10-CM

## 2021-05-22 DIAGNOSIS — L409 Psoriasis, unspecified: Principal | ICD-10-CM

## 2021-05-22 MED ORDER — TRIAMCINOLONE ACETONIDE 0.1 % TOPICAL OINTMENT
Freq: Two times a day (BID) | TOPICAL | 1 refills | 0.00000 days | Status: CP | PRN
Start: 2021-05-22 — End: 2022-05-22

## 2021-05-26 ENCOUNTER — Other Ambulatory Visit: Payer: Self-pay | Admitting: Family

## 2021-05-26 DIAGNOSIS — E1159 Type 2 diabetes mellitus with other circulatory complications: Secondary | ICD-10-CM

## 2021-05-30 DIAGNOSIS — L409 Psoriasis, unspecified: Principal | ICD-10-CM

## 2021-05-30 MED ORDER — COSENTYX 150 MG/ML SUBCUTANEOUS SYRINGE
SUBCUTANEOUS | 4 refills | 0.00000 days | Status: CP
Start: 2021-05-30 — End: 2021-06-28
  Filled 2021-06-12: qty 10, 35d supply, fill #0

## 2021-06-04 ENCOUNTER — Ambulatory Visit: Payer: Medicaid Other | Admitting: Family

## 2021-06-05 NOTE — Unmapped (Unsigned)
[facility]   Patient Onboarding/Medication Counseling    Morgan Edwards is a 46 y.o. female with Plaque Psoriasis who I am counseling today on initiation of therapy.  I am speaking to the patient.    Was a Nurse, learning disability used for this call? No    Verified patient's date of birth / HIPAA.    Specialty medication(s) to be sent: Inflammatory Disorders: Cosentyx    Non-specialty medications/supplies to be sent: sharps kit    Medications not needed at this time: N/A     Cosentyx (secukinumab)    Medication & Administration     Dosage: Plaque psoriasis: Inject 300mg  under the skin at weeks 0, 1, 2, 3, and 4 followed by 300mg  every 4 weeks    Lab tests required prior to treatment initiation:  Tuberculosis: Tuberculosis screening resulted in a non-reactive Quantiferon TB Gold assay.    Administration:   Prefilled Sensoready?? auto-injector pen *pt prefers pen to syringe*  Gather all supplies needed for injection on a clean, flat working surface: medication pen removed from packaging, alcohol swab, sharps container, etc.  Look at the medication label - look for correct medication, correct dose, and check the expiration date  Look at the medication - the liquid visible in the window on the side of the pen device should appear clear and colorless to slightly yellow  Lay the auto-injector pen on a flat surface and allow it to warm up to room temperature for at least 15-30 minutes  Select injection site - you can use the front of your thigh or your belly (but not the area 2 inches around your belly button); if someone else is giving you the injection you can also use your upper arm in the skin covering your triceps muscle  Prepare injection site - wash your hands and clean the skin at the injection site with an alcohol swab and let it air dry, do not touch the injection site again before the injection  Twist off the purple safety cap in the direction of the arrow, do not remove until immediately prior to injection and do not touch the yellow needle cover  Put the white needle cover against your skin at the injection site at a 90 degree angle, hold the pen such that you can see the clear medication window  Press down and hold the pen firmly against your skin, there will be a click when the injection starts   Continue to hold the pen firmly against your skin for about 10-15 seconds - the window will start to turn solid green  There will be a second click sound when the injection is almost complete, verify the window is solid green to indicate the injection is complete and then pull the pen away from your skin  Dispose of the used auto-injector pen immediately in your sharps disposal container the needle will be covered automatically  If you see any blood at the injection site, press a cotton ball or gauze on the site and maintain pressure until the bleeding stops, do not rub the injection site    Adherence/Missed dose instructions:  If your injection is given more than 4 days after your scheduled injection date - consult your pharmacist for additional instructions on how to adjust your dosing schedule.    Goals of Therapy     Plaque Psoriasis  Minimize areas of skin involvement (% BSA)  Avoidance of long term glucocorticoid use  Maintenance of effective psychosocial functioning    Side  Effects & Monitoring Parameters     Injection site reaction (redness, irritation, inflammation localized to the site of administration)  Signs of a common cold - minor sore throat, runny or stuffy nose, etc.  Diarrhea    The following side effects should be reported to the provider:  Signs of a hypersensitivity reaction - rash; hives; itching; red, swollen, blistered, or peeling skin; wheezing; tightness in the chest or throat; difficulty breathing, swallowing, or talking; swelling of the mouth, face, lips, tongue, or throat; etc.  Reduced immune function - report signs of infection such as fever; chills; body aches; very bad sore throat; ear or sinus pain; cough; more sputum or change in color of sputum; pain with passing urine; wound that will not heal, etc.  Also at a slightly higher risk of some malignancies (mainly skin and blood cancers) due to this reduced immune function.  In the case of signs of infection - the patient should hold the next dose of Cosentyx?? and call your primary care provider to ensure adequate medical care.  Treatment may be resumed when infection is treated and patient is asymptomatic.  Muscle pain or weakness  Shortness of breath    Warnings, Precautions, & Contraindications     Have your bloodwork checked as you have been told by your prescriber  Talk with your doctor if you are pregnant, planning to become pregnant, or breastfeeding  Discuss the possible need for holding your dose(s) of Cosentyx?? when a planned procedure is scheduled with the prescriber as it may delay healing/recovery timeline     Drug/Food Interactions     Medication list reviewed in Epic. The patient was instructed to inform the care team before taking any new medications or supplements. No drug interactions identified.   If you have a latex allergy use caution when handling, the needle cap of the Cosentyx?? prefilled syringe and the safety cap for the Cosentyx Sensoready?? pen contains a derivative of natural rubber latex  Talk with you prescriber or pharmacist before receiving any live vaccinations while taking this medication and after you stop taking it    Storage, Handling Precautions, & Disposal     Store this medication in the refrigerator.  Do not freeze  May store intact Sensoready pens and 150 mg/mL prefilled syringes at ?30??C (?86??F) for up to 4 days; may return to the refrigerator if unused  Store in original packaging, protected from light  Do not shake  Dispose of used syringes/pens in a sharps disposal container    Current Medications (including OTC/herbals), Comorbidities and Allergies     Current Outpatient Medications Medication Sig Dispense Refill    ACCU-CHEK SOFTCLIX LANCETS lancets CHECK BLOOD SUGAR EVERY MORNING AS DIRECTED 100 each 5    amLODIPine (NORVASC) 10 MG tablet Take 1 tablet (10 mg total) by mouth daily.      blood sugar diagnostic (ACCU-CHEK AVIVA) Strp by Other route every morning. 50 each 6    cetirizine (ZYRTEC) 10 MG tablet Take 1 tablet (10 mg total) by mouth daily.      clobetasoL (TEMOVATE) 0.05 % cream APPLY ONE APPLICATION TOPICALLY TWICE DAILY      clobetasoL (TEMOVATE) 0.05 % ointment APPLY ONE APPLICATION TOPICALLY TWICE DAILY (Patient not taking: Reported on 05/21/2021)      clobetasoL (TEMOVATE) 0.05 % ointment APPLY ONE APPLICATION TOPICALLY TWICE DAILY (Patient not taking: Reported on 05/21/2021)      dulaglutide (TRULICITY) 0.75 mg/0.5 mL injection pen Inject 0.75 mg under the skin. (Patient not  taking: Reported on 05/21/2021)      fluticasone propionate (FLONASE) 50 mcg/actuation nasal spray USE TWO SPRAYS IN EACH NOSTRIL EVERY DAY      furosemide (LASIX) 20 MG tablet       gabapentin (NEURONTIN) 600 MG tablet  (Patient not taking: Reported on 05/21/2021)      gabapentin (NEURONTIN) 600 MG tablet Take 1 tablet (600 mg total) by mouth.      lisinopriL (PRINIVIL,ZESTRIL) 20 MG tablet Take 1 tablet (20 mg total) by mouth daily.      omeprazole (PRILOSEC) 20 MG capsule Take 1 capsule (20 mg total) by mouth daily.      secukinumab (COSENTYX) 150 mg/mL Syrg Inject the contents of 2 syringes (300 mg total) under the skin every twenty-eight (28) days. 1 mL 4    secukinumab (COSENTYX) 150 mg/mL Syrg Inject the contents of 2 syringes (300 mg total) under the skin once a week for 5 doses. 10 mL 0    triamcinolone (KENALOG) 0.1 % ointment Apply topically two (2) times a day as needed. Use on psoriasis. 454 g 1     No current facility-administered medications for this visit.     Allergies   Allergen Reactions    Clindamycin Rash    Erythromycin Diarrhea, Other (See Comments) and Itching     Abdominal pain Patient Active Problem List   Diagnosis    Diabetes mellitus, type 2 (CMS-HCC)    Hypertension associated with diabetes (CMS-HCC)    Obesity, morbid with BMI of 45.0-49.9, adult    Tobacco use    GERD (gastroesophageal reflux disease)    HIV    ORSA skin lesions    Screening, prevention, health maintenance    Tonsil and adenoid disease, chronic    PTSD w/ depression    Contraception    Obstructive sleep apnea    Restless leg syndrome    Panic attack as reaction to stress    Diabetic polyneuropathy associated with type 2 diabetes mellitus (CMS-HCC)    Cervical dysplasia    Current smoker    Depression    Diabetic peripheral neuropathy (CMS-HCC)    Vitamin D deficiency    GAD (generalized anxiety disorder)    Hyperlipidemia associated with type 2 diabetes mellitus (CMS-HCC)    Psoriasis    MRSA (methicillin resistant Staphylococcus aureus)    Depression, recurrent (CMS-HCC)    Hx of cholecystectomy     Reviewed and up to date in Epic.    Appropriateness of Therapy     Acute infections noted within Epic:  MRSA  Patient reported infection: None    Is medication and dose appropriate based on diagnosis and infection status? Yes    Prescription has been clinically reviewed: Yes    Baseline Quality of Life Assessment      How many days over the past month did your plaque psoriasis  keep you from your normal activities? For example, brushing your teeth or getting up in the morning. 0    Financial Information     Medication Assistance provided: Prior Authorization    Anticipated copay of $4/56 days (loading) & $4/28 days (maintenance) reviewed with patient. Verified delivery address.    Delivery Information     Scheduled delivery date: 6/1    Expected start date: 6/1    Medication will be delivered via UPS to the prescription address in Epic [place].  This shipment will not require a signature.      Explained the services we provide at  [facility] and that each month we would call to set up refills. Stressed importance of returning phone calls so that we could ensure they receive their medications in time each month.  Informed patient that we should be setting up refills 7-10 days prior to when they will run out of medication.  A pharmacist will reach out to perform a clinical assessment periodically.  Informed patient that a welcome packet, containing information about our pharmacy and other support services, a Notice of Privacy Practices, and a drug information handout will be sent.      The patient or caregiver noted above participated in the development of this care plan and knows that they can request review of or adjustments to the care plan at any time.      Patient or caregiver verbalized understanding of the above information as well as how to contact the pharmacy at [phone] option 4 with any questions/concerns.  The pharmacy is open Monday through Friday 8:30am-4:30pm.  A pharmacist is available 24/7 via pager to answer any clinical questions they may have.    Patient Specific Needs     Does the patient have any physical, cognitive, or cultural barriers? No    Does the patient have adequate living arrangements? (i.e. the ability to store and take their medication appropriately) Yes    Did you identify any home environmental safety or security hazards? No    Patient prefers to have medications discussed with   self or mother      Is the patient or caregiver able to read and understand education materials at a high school level or above? Yes    Patient's primary language is  English     Is the patient high risk? No    SOCIAL DETERMINANTS OF HEALTH     At the [facility], we have learned that life circumstances - like trouble affording food, housing, utilities, or transportation can affect the health of many of our patients.   That is why we wanted to ask: are you currently experiencing any life circumstances that are negatively impacting your health and/or quality of life? Yes    Social Determinants of Psychologist, prison and probation services Strain: Not on file   Internet Connectivity: Not on file   Food Insecurity: Not on file   Tobacco Use: High Risk    Smoking Tobacco Use: Every Day    Smokeless Tobacco Use: Never    Passive Exposure: Not on file   Housing/Utilities: Not on file   Alcohol Use: Not on file   Transportation Needs: Not on file   Substance Use: Not on file   Health Literacy: Not on file   Physical Activity: Not on file   Interpersonal Safety: Not on file   Stress: Not on file   Intimate Partner Violence: Not on file   Depression: Not at risk    PHQ-2 Score: 0   Social Connections: Not on file     Would you be willing to receive help with any of the needs that you have identified today? No     Karmen Stabs  [facility] Specialty Pharmacy Student pager to answer any clinical questions they may have.    Patient Specific Needs     Does the patient have any physical, cognitive, or cultural barriers? {Blank single:19197::No,Yes - ***}    Does the patient have adequate living arrangements? (i.e. the ability to store and take their medication appropriately) {  Blank single:19197::Yes,No - ***}    Did you identify any home environmental safety or security hazards? {Blank single:19197::No,Yes - ***}    Patient prefers to have medications discussed with  {Blank single:19197::Patient,Family Member,Caregiver,Other}     Is the patient or caregiver able to read and understand education materials at a high school level or above? {Blank single:19197::No,Yes}    Patient's primary language is  {Blank single:19197::English,Spanish,***}     Is the patient high risk? {sschighriskpts:78327}    SOCIAL DETERMINANTS OF HEALTH     At the [facility], we have learned that life circumstances - like trouble affording food, housing, utilities, or transportation can affect the health of many of our patients.   That is why we wanted to ask: are you currently experiencing any life circumstances that are negatively impacting your health and/or quality of life? {YES/NO/PATIENTDECLINED:93004}    Social Determinants of Health     Financial Resource Strain: Not on file   Internet Connectivity: Not on file   Food Insecurity: Not on file   Tobacco Use: High Risk    Smoking Tobacco Use: Every Day    Smokeless Tobacco Use: Never    Passive Exposure: Not on file   Housing/Utilities: Not on file   Alcohol Use: Not on file   Transportation Needs: Not on file   Substance Use: Not on file   Health Literacy: Not on file   Physical Activity: Not on file   Interpersonal Safety: Not on file   Stress: Not on file   Intimate Partner Violence: Not on file   Depression: Not at risk    PHQ-2 Score: 0   Social Connections: Not on file     Would you be willing to receive help with any of the needs that you have identified today? {Yes/No/Not applicable:93005}     Karmen Stabs  [facility] Specialty Pharmacy Student

## 2021-06-05 NOTE — Unmapped (Signed)
[facility] Specialty Medication Onboarding    Specialty Medication: COSENTYX 150 mg/mL Syrg (secukinumab)  Prior Authorization: Approved   Financial Assistance: No - copay  <$25  Final Copay/Day Supply: $0 / 35 (loading)          $0 / 28 (maintenance)    Insurance Restrictions: None     Notes to Pharmacist: n/a    The triage team has completed the benefits investigation and has determined that the patient is able to fill this medication at [facility]. Please contact the patient to complete the onboarding or follow up with the prescribing physician as needed.

## 2021-06-07 MED ORDER — EMPTY CONTAINER
2 refills | 0 days
Start: 2021-06-07 — End: ?

## 2021-06-12 MED FILL — EMPTY CONTAINER: 120 days supply | Qty: 1 | Fill #0

## 2021-06-13 ENCOUNTER — Encounter: Payer: Self-pay | Admitting: Family

## 2021-06-13 ENCOUNTER — Ambulatory Visit: Payer: Medicaid Other | Admitting: Family

## 2021-06-13 VITALS — BP 123/79 | HR 92 | Temp 97.8°F | Ht 67.0 in | Wt 314.0 lb

## 2021-06-13 DIAGNOSIS — F331 Major depressive disorder, recurrent, moderate: Secondary | ICD-10-CM

## 2021-06-13 DIAGNOSIS — E1159 Type 2 diabetes mellitus with other circulatory complications: Secondary | ICD-10-CM | POA: Diagnosis not present

## 2021-06-13 DIAGNOSIS — Z6841 Body Mass Index (BMI) 40.0 and over, adult: Secondary | ICD-10-CM

## 2021-06-13 DIAGNOSIS — E1169 Type 2 diabetes mellitus with other specified complication: Secondary | ICD-10-CM

## 2021-06-13 DIAGNOSIS — L4050 Arthropathic psoriasis, unspecified: Secondary | ICD-10-CM

## 2021-06-13 DIAGNOSIS — F17200 Nicotine dependence, unspecified, uncomplicated: Secondary | ICD-10-CM

## 2021-06-13 DIAGNOSIS — M5442 Lumbago with sciatica, left side: Secondary | ICD-10-CM

## 2021-06-13 DIAGNOSIS — I152 Hypertension secondary to endocrine disorders: Secondary | ICD-10-CM

## 2021-06-13 DIAGNOSIS — E559 Vitamin D deficiency, unspecified: Secondary | ICD-10-CM

## 2021-06-13 DIAGNOSIS — E1142 Type 2 diabetes mellitus with diabetic polyneuropathy: Secondary | ICD-10-CM | POA: Diagnosis not present

## 2021-06-13 DIAGNOSIS — F411 Generalized anxiety disorder: Secondary | ICD-10-CM

## 2021-06-13 DIAGNOSIS — B20 Human immunodeficiency virus [HIV] disease: Secondary | ICD-10-CM

## 2021-06-13 DIAGNOSIS — K219 Gastro-esophageal reflux disease without esophagitis: Secondary | ICD-10-CM | POA: Diagnosis not present

## 2021-06-13 DIAGNOSIS — E785 Hyperlipidemia, unspecified: Secondary | ICD-10-CM

## 2021-06-13 LAB — BAYER DCA HB A1C WAIVED: HB A1C (BAYER DCA - WAIVED): 5.9 % — ABNORMAL HIGH (ref 4.8–5.6)

## 2021-06-13 MED ORDER — LISINOPRIL 20 MG PO TABS: 20.0000 mg | ORAL_TABLET | Freq: Every day | ORAL | 3 refills | Status: DC

## 2021-06-13 MED ORDER — OMEPRAZOLE 20 MG PO CPDR: DELAYED_RELEASE_CAPSULE | ORAL | 0 refills | Status: DC

## 2021-06-13 MED ORDER — FUROSEMIDE 20 MG PO TABS
20.0000 mg | ORAL_TABLET | Freq: Every day | ORAL | 0 refills | Status: DC
Start: 2021-06-13 — End: 2021-11-21

## 2021-06-13 MED ORDER — SEMAGLUTIDE(0.25 OR 0.5MG/DOS) 2 MG/3ML ~~LOC~~ SOPN: 0.5000 mg | PEN_INJECTOR | SUBCUTANEOUS | 1 refills | Status: DC

## 2021-06-13 MED ORDER — AMLODIPINE BESYLATE 10 MG PO TABS: 10.0000 mg | ORAL_TABLET | Freq: Every day | ORAL | 0 refills | Status: DC

## 2021-06-13 MED ORDER — GABAPENTIN 600 MG PO TABS: 600.0000 mg | ORAL_TABLET | Freq: Three times a day (TID) | ORAL | 0 refills | Status: DC

## 2021-06-13 MED ORDER — DICLOFENAC SODIUM 75 MG PO TBEC: 75.0000 mg | DELAYED_RELEASE_TABLET | Freq: Two times a day (BID) | ORAL | 1 refills | Status: DC

## 2021-06-13 NOTE — Patient Instructions (Signed)

## 2021-06-13 NOTE — Progress Notes (Signed)
Subjective:    Patient ID: Jasmine Peck, female    DOB: July 13, 1975, 46 y.o.   MRN: [id number]  Chief Complaint  Patient presents with   Medication Refill   Hip Pain    3 weeks - very painful    Pt presents to the office today for chronic follow up. Pt is HIV positive and is followed by Infectious Disease every 6 months. She is followed by Dermatologist for psoriasis every 3 months.     She is reports she started smoking when she was 46 years old and been smoking for 30 years. Hip Pain  The incident occurred more than 1 week ago. There was no injury mechanism. The pain is present in the left hip. The pain is at a severity of 7/10. The pain is moderate. The pain has been Intermittent since onset. Pertinent negatives include no numbness or tingling. She reports no foreign bodies present. The symptoms are aggravated by movement and weight bearing. She has tried acetaminophen for the symptoms. The treatment provided mild relief.  Hypertension This is a chronic problem. The current episode started more than 1 year ago. The problem has been resolved since onset. Associated symptoms include anxiety, malaise/fatigue and peripheral edema. Pertinent negatives include no blurred vision or shortness of breath. Risk factors for coronary artery disease include obesity, sedentary lifestyle, dyslipidemia and diabetes mellitus. The current treatment provides moderate improvement.  Gastroesophageal Reflux She complains of belching and heartburn. This is a chronic problem. The current episode started more than 1 year ago. The problem occurs occasionally. The problem has been waxing and waning. Risk factors include obesity. She has tried a PPI for the symptoms. The treatment provided moderate relief.  Hyperlipidemia This is a chronic problem. The current episode started more than 1 year ago. The problem is uncontrolled. Recent lipid tests were reviewed and are normal. Exacerbating diseases include obesity.  Pertinent negatives include no shortness of breath. Current antihyperlipidemic treatment includes diet change. The current treatment provides mild improvement of lipids. Risk factors for coronary artery disease include diabetes mellitus, dyslipidemia, hypertension, a sedentary lifestyle and post-menopausal.  Diabetes She presents for her follow-up diabetic visit. She has type 2 diabetes mellitus. Hypoglycemia symptoms include nervousness/anxiousness. Associated symptoms include foot paresthesias. Pertinent negatives for diabetes include no blurred vision. Diabetic complications include heart disease and peripheral neuropathy. Risk factors for coronary artery disease include dyslipidemia, diabetes mellitus, hypertension, sedentary lifestyle and post-menopausal. She is following a generally healthy diet. Her overall blood glucose range is 110-130 mg/dl. Eye exam is current.  Anxiety Presents for follow-up visit. Symptoms include excessive worry, irritability and nervous/anxious behavior. Patient reports no shortness of breath. Symptoms occur occasionally. The severity of symptoms is moderate.    Depression        The current episode started more than 1 year ago.   Associated symptoms include no helplessness, no hopelessness, not irritable, no appetite change and not sad.  Past treatments include nothing.  Past medical history includes anxiety.   Nicotine Dependence Presents for follow-up visit. Symptoms include irritability. Her urge triggers include company of smokers. The symptoms have been stable. She smokes < 1/2 a pack of cigarettes per day.     Review of Systems  Constitutional:  Positive for irritability and malaise/fatigue. Negative for appetite change.  Eyes:  Negative for blurred vision.  Respiratory:  Negative for shortness of breath.   Gastrointestinal:  Positive for heartburn.  Neurological:  Negative for tingling and numbness.  Psychiatric/Behavioral:  Positive for depression.  The  patient is nervous/anxious.   All other systems reviewed and are negative.     Objective:   Physical Exam Vitals reviewed.  Constitutional:      General: She is not irritable.She is not in acute distress.    Appearance: She is well-developed. She is obese.  HENT:     Head: Normocephalic and atraumatic.     Right Ear: Tympanic membrane normal.     Left Ear: Tympanic membrane normal.  Eyes:     Pupils: Pupils are equal, round, and reactive to light.  Neck:     Thyroid: No thyromegaly.  Cardiovascular:     Rate and Rhythm: Normal rate and regular rhythm.     Heart sounds: Normal heart sounds. No murmur heard. Pulmonary:     Effort: Pulmonary effort is normal. No respiratory distress.     Breath sounds: Normal breath sounds. No wheezing.  Abdominal:     General: Bowel sounds are normal. There is no distension.     Palpations: Abdomen is soft.     Tenderness: There is no abdominal tenderness.  Musculoskeletal:        General: No tenderness. Normal range of motion.     Cervical back: Normal range of motion and neck supple.  Skin:    General: Skin is warm and dry.  Neurological:     Mental Status: She is alert and oriented to person, place, and time.     Cranial Nerves: No cranial nerve deficit.     Deep Tendon Reflexes: Reflexes are normal and symmetric.  Psychiatric:        Behavior: Behavior normal.        Thought Content: Thought content normal.        Judgment: Judgment normal.      BP 123/79   Pulse 92   Temp 97.8 F (36.6 C)   Ht 5' 7"  (1.702 m)   Wt (!) 314 lb (142.4 kg)   SpO2 98%   BMI 49.18 kg/m      Assessment & Plan:  Jasmine Peck comes in today with chief complaint of Medication Refill and Hip Pain (3 weeks - very painful )   Diagnosis and orders addressed:  1. Hypertension associated with diabetes ([place]) - amLODipine (NORVASC) 10 MG tablet; Take 1 tablet (10 mg total) by mouth daily.  Dispense: 90 tablet; Refill: 0 - lisinopril (ZESTRIL) 20  MG tablet; Take 1 tablet (20 mg total) by mouth daily.  Dispense: 90 tablet; Refill: 3 - CMP14+EGFR - CBC with Differential/Platelet  2. Gastroesophageal reflux disease, unspecified whether esophagitis present  - omeprazole (PRILOSEC) 20 MG capsule; TAKE ONE CAPSULE BY MOUTH EVERY DAY  Dispense: 90 capsule; Refill: 0 - CMP14+EGFR - CBC with Differential/Platelet  3. Diabetic peripheral neuropathy (HCC) - gabapentin (NEURONTIN) 600 MG tablet; Take 1 tablet (600 mg total) by mouth 3 (three) times daily. (NEEDS TO BE SEEN BEFORE NEXT REFILL)  Dispense: 90 tablet; Refill: 0 - CMP14+EGFR - CBC with Differential/Platelet - Semaglutide,0.25 or 0.5MG/DOS, 2 MG/3ML SOPN; Inject 0.5 mg into the skin once a week.  Dispense: 3 mL; Refill: 1  4. Hyperlipidemia associated with type 2 diabetes mellitus ([place]) - CMP14+EGFR - CBC with Differential/Platelet - Semaglutide,0.25 or 0.5MG/DOS, 2 MG/3ML SOPN; Inject 0.5 mg into the skin once a week.  Dispense: 3 mL; Refill: 1  5. Type 2 diabetes mellitus with diabetic polyneuropathy, without long-term current use of insulin (HCC) -Start Ozempic 0.5 mg  Strict low carb diet  - [employer]  DCA Hb A1c Waived - CMP14+EGFR - CBC with Differential/Platelet - Semaglutide,0.25 or 0.5MG/DOS, 2 MG/3ML SOPN; Inject 0.5 mg into the skin once a week.  Dispense: 3 mL; Refill: 1 - Microalbumin / creatinine urine ratio  6. Psoriatic arthritis ([place]) - CMP14+EGFR - CBC with Differential/Platelet  7. Morbid obesity with BMI of 45.0-49.9, adult (HCC) - CMP14+EGFR - CBC with Differential/Platelet - Semaglutide,0.25 or 0.5MG/DOS, 2 MG/3ML SOPN; Inject 0.5 mg into the skin once a week.  Dispense: 3 mL; Refill: 1  8. Current smoker  - CMP14+EGFR - CBC with Differential/Platelet  9. Moderate episode of recurrent major depressive disorder (HCC) - CMP14+EGFR - CBC with Differential/Platelet  10. GAD (generalized anxiety disorder) - CMP14+EGFR - CBC with  Differential/Platelet  11. Human immunodeficiency virus (HIV) disease ([place]) - CMP14+EGFR - CBC with Differential/Platelet  12. Vitamin D deficiency - CMP14+EGFR - CBC with Differential/Platelet  13. Acute left-sided low back pain with left-sided sciatica Start diclofenac BID with food  No other NSAID's Gabapentin 600 mg TID - diclofenac (VOLTAREN) 75 MG EC tablet; Take 1 tablet (75 mg total) by mouth 2 (two) times daily.  Dispense: 60 tablet; Refill: 1   Labs pending Health Maintenance reviewed Diet and exercise encouraged  Follow up plan: 1 month    [name], FNP

## 2021-06-14 ENCOUNTER — Other Ambulatory Visit: Payer: Self-pay | Admitting: Family

## 2021-06-14 ENCOUNTER — Telehealth: Payer: Self-pay | Admitting: *Deleted

## 2021-06-14 LAB — CMP14+EGFR
ALT: 9 IU/L (ref 0–32)
AST: 11 IU/L (ref 0–40)
Albumin/Globulin Ratio: 1.4 (ref 1.2–2.2)
Albumin: 3.9 g/dL (ref 3.8–4.8)
Alkaline Phosphatase: 97 IU/L (ref 44–121)
BUN/Creatinine Ratio: 16 (ref 9–23)
BUN: 14 mg/dL (ref 6–24)
Bilirubin Total: 0.3 mg/dL (ref 0.0–1.2)
CO2: 24 mmol/L (ref 20–29)
Calcium: 8.5 mg/dL — ABNORMAL LOW (ref 8.7–10.2)
Chloride: 103 mmol/L (ref 96–106)
Creatinine, Ser: 0.89 mg/dL (ref 0.57–1.00)
Globulin, Total: 2.8 g/dL (ref 1.5–4.5)
Glucose: 156 mg/dL — ABNORMAL HIGH (ref 70–99)
Potassium: 4.1 mmol/L (ref 3.5–5.2)
Sodium: 141 mmol/L (ref 134–144)
Total Protein: 6.7 g/dL (ref 6.0–8.5)
eGFR: 81 mL/min/{1.73_m2} (ref >59)

## 2021-06-14 LAB — CBC WITH DIFFERENTIAL/PLATELET
Basophils Absolute: 0.1 10*3/uL (ref 0.0–0.2)
Basos: 1 %
EOS (ABSOLUTE): 0.1 10*3/uL (ref 0.0–0.4)
Eos: 1 %
Hematocrit: 38.7 % (ref 34.0–46.6)
Hemoglobin: 12.9 g/dL (ref 11.1–15.9)
Immature Grans (Abs): 0 10*3/uL (ref 0.0–0.1)
Immature Granulocytes: 0 %
Lymphocytes Absolute: 3.7 10*3/uL — ABNORMAL HIGH (ref 0.7–3.1)
Lymphs: 34 %
MCH: 31.6 pg (ref 26.6–33.0)
MCHC: 33.3 g/dL (ref 31.5–35.7)
MCV: 95 fL (ref 79–97)
Monocytes Absolute: 0.6 10*3/uL (ref 0.1–0.9)
Monocytes: 6 %
Neutrophils Absolute: 6.3 10*3/uL (ref 1.4–7.0)
Neutrophils: 58 %
Platelets: 363 10*3/uL (ref 150–450)
RBC: 4.08 x10E6/uL (ref 3.77–5.28)
RDW: 12.4 % (ref 11.7–15.4)
WBC: 10.7 10*3/uL (ref 3.4–10.8)

## 2021-06-14 LAB — MICROALBUMIN / CREATININE URINE RATIO
Creatinine, Urine: 200.6 mg/dL
Microalb/Creat Ratio: 9 mg/g creat (ref 0–29)
Microalbumin, Urine: 17.5 ug/mL

## 2021-06-14 NOTE — Telephone Encounter (Signed)
Jasmine Peck (Key: [id number]) Rx #: [id number] Ozempic (0.25 or 0.5 MG/DOSE) '2MG'$ /3ML pen-injectors  Sent to plan

## 2021-06-17 NOTE — Telephone Encounter (Signed)
Laporsche Hoeger (Key: [id number]) Rx #: [id number] Ozempic (0.25 or 0.5 MG/DOSE) '2MG'$ /3ML pen-injectors   Form [employer] Electronic [place] Form [id number] NCPDP) Created 4 days ago Sent to Plan 3 days ago Plan Response 3 days ago Submit Clinical Questions 3 days ago Determination Favorable 3 days ago Message from Plan PA Case: [id number], Status: Approved, Coverage Starts on: 06/14/2021 12:00:00 AM, Coverage Ends on: 06/14/2022 12:00:00 AM.

## 2021-07-11 NOTE — Unmapped (Signed)
Referring Provider:  Jodi Marble, [facility], [facility]    CC: negative pap, HPV HR + (untyped)    HPI:   Morgan Edwards is a 46 y.o.  [phone]  who presents today for evaluation and management of abnormal cervical cytology.  She notes for the last few years she is having heavy vaginal bleeding every 2-6 weeks.        LMP: Patient's last menstrual period was 07/08/2021.  Using abstinence for now for contraception    Hx of HPV vaccination: no  Sexually active now:  Not currently.   Partners are female  Hx of STI's:  History of Human Papillomavirus - HPV and HIV  Hx of HIV: yes  Hx of HIV testing: yes, RNA undetectable 05/22/21    Tobacco Use:     -Current: yes, 1/2 ppd    -Past: yes    --How many years: 25    Menses:  Irregular  Last MMG:  12/2020, Birads 1      Dysplasia History:    07/2014: ASC-H, HPV pos  09/2014: CIN 1 at 9 o'clock, ECC neg  10/2015: ASC-H, HPV pos  02/2018, pap smear done, says she was told it was fine  02/2021:  negative cells, HPV pos       ROS:  A 12 point review of systems was negative except for pertinent items noted in the HPI.      OB History   Gravida Para Term Preterm AB Living   4 3 3   1 3    SAB IAB Ectopic Molar Multiple Live Births   1         3      # Outcome Date GA Lbr Len/2nd Weight Sex Delivery Anes PTL Lv   4 Term 05/14/13 [redacted]w[redacted]d  3841 g (8 lb 7.5 oz)  Vag-Spont   LIV   3 Term 10/08/00 [redacted]w[redacted]d  3714 g (8 lb 3 oz) F Vag-Spont EPI  LIV   2 Term 07/12/92 [redacted]w[redacted]d  3657 g (8 lb 1 oz) F Vag-Spont   LIV   1 SAB               Obstetric Comments   Induced at 37 weeks for both children due to large babies       Past Medical History:   Diagnosis Date    Anxiety and depression 12/06/2012    2014-11 reports chronic depression 2nd to multiple traumatic experiences.      Cervical dysplasia 10/26/2015    Cervical Dysplasia 07/2014: ASC-H, HPV pos 09/2014: CIN 1 at 9 o'clock, ECC neg 10/2015: ASC-H, HPV pos 12/2015: Colpo at 12 o clock, ECC, both negative    Contraception 01/24/2013    2015-05 declined btl at time of delivery; plans to be abstinent 2015-01 Wants BTL. Signed medicaid paper 01/24/13     Current smoker 06/18/2016    Depression, recurrent (CMS-HCC) 12/06/2012    Diabetes mellitus, type 2 (CMS-HCC) 11/23/2012    No meds at start of pregnancy -> glyburide 2.5 mg qhs -> glyburide 5 mg qhs A1c 5.1 (Jan 2015)    Diabetic peripheral neuropathy (CMS-HCC) 06/18/2016    Diabetic polyneuropathy associated with type 2 diabetes mellitus (CMS-HCC) 04/19/2015    Essential hypertension 11/23/2012    On 100mg  Labetalol BID at start of pregnancy Baseline HELLP labs wnl    GAD (generalized anxiety disorder) 04/04/2014    GERD (gastroesophageal reflux disease) since 2010    HIV disease (CMS-HCC) 2014  Dx in pregnancy    Hx of cholecystectomy 03/14/2019    Hypertension complicating pregnancy, childbirth, and the puerperium 11/23/2012    On 100mg  Labetalol BID at start of pregnancy Baseline HELLP labs wnl     MRSA (methicillin resistant Staphylococcus aureus)     Obesity, morbid with BMI of 45.0-49.9, adult 11/23/2012    BMI 49 at start of pregnancy TWG- 12 kg (4/2) Monthly growth [place]  [ ]  Consider ppx lovenox while in house after delivery     Obstructive sleep apnea 02/01/2013    2015-02 CPAP titration suggest 10, 11 , 12 pressure. 2015-01 sleep study = c/w moderate obstructive sleep apnea      ORSA skin lesions 11/25/2012    + ORSA culture from abdominal wound, prescribed clindamycin on 11/12     Panic attack as reaction to stress 11/03/2013    Psoriasis     PTSD w/ depression 12/06/2012    2014-11 reports chronic depression 2nd to multiple traumatic experiences.      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) (Mar 16109)  Tu    Tobacco use 11/23/2012    1/2ppd at start of pregnancy -> 3 cig/day Patches don't work  4/2 - not motivated to quit     Tonsil and adenoid disease, chronic     Urinary tract infection     Vitamin D deficiency 04/05/2014       Past Surgical History:   Procedure Laterality Date    ABSCESS DRAINAGE      CHOLECYSTECTOMY  2000    [facility]    DENTAL SURGERY      DILATION AND CURETTAGE OF UTERUS  [phone]    x2 for heavy bleeding    FULL DENT RESTOR:MAY INCL ORAL EXM;DENT XRAYS;PROPHY/FL TX;DENT RESTOR;PULP TX;DENT EXTR;DENT AP Bilateral 11/27/2014    Procedure: Full Dental Restor:May Incl Oral Exam;Dent Xrays;Prophy/Fl Tx;Dent Restor;Pulp Tx;Dent Extr;Dent Appliances;  Surgeon: Roderick Pee, DDS;  Location: MAIN OR [place];  Service: Oral Medicine       SOCIAL HISTORY:    Social History     Substance and Sexual Activity   Alcohol Use No    Alcohol/week: 0.0 standard drinks       Social History     Substance and Sexual Activity   Drug Use No       Occupation:  Firefighter Information:  Single    Family History   Problem Relation Age of Onset    Diabetes Mother     Heart disease Mother     Diabetes Father     Clotting disorder Neg Hx     Anesthesia problems Neg Hx     Melanoma Neg Hx     Basal cell carcinoma Neg Hx     Squamous cell carcinoma Neg Hx     Celiac disease Neg Hx     Cholelithiasis Neg Hx     Cirrhosis Neg Hx     Colorectal Cancer Neg Hx     Crohn's disease Neg Hx     Esophageal  cancer Neg Hx     Hemochromatosis Neg Hx     Inflammatory bowel disease Neg Hx     Irritable bowel syndrome Neg Hx     Liver cancer Neg Hx     Pancreatic cancer Neg Hx     Pancreatitis Neg Hx     Stomach cancer Neg Hx     Ulcerative colitis Neg Hx        ALLERGIES:  Clindamycin and Erythromycin    Current Outpatient Medications   Medication Sig Dispense Refill    ACCU-CHEK SOFTCLIX LANCETS lancets CHECK BLOOD SUGAR EVERY MORNING AS DIRECTED 100 each 5    amLODIPine (NORVASC) 10 MG tablet Take 1 tablet (10 mg total) by mouth daily.      blood sugar diagnostic (ACCU-CHEK AVIVA) Strp by Other route every morning. 50 each 6    cetirizine (ZYRTEC) 10 MG tablet Take 1 tablet (10 mg total) by mouth daily.      clobetasoL (TEMOVATE) 0.05 % cream APPLY ONE APPLICATION TOPICALLY TWICE DAILY      clobetasoL (TEMOVATE) 0.05 % ointment       clobetasoL (TEMOVATE) 0.05 % ointment       fluticasone propionate (FLONASE) 50 mcg/actuation nasal spray USE TWO SPRAYS IN EACH NOSTRIL EVERY DAY      furosemide (LASIX) 20 MG tablet       gabapentin (NEURONTIN) 600 MG tablet       lisinopriL (PRINIVIL,ZESTRIL) 20 MG tablet Take 1 tablet (20 mg total) by mouth daily.      omeprazole (PRILOSEC) 20 MG capsule Take 1 capsule (20 mg total) by mouth daily.      secukinumab (COSENTYX PEN, 2 PENS,) 150 mg/mL PnIj injection Inject the contents of 2 pens (300 mg total) under the skin once a week for 5 doses. 10 mL 0    secukinumab (COSENTYX) 150 mg/mL Syrg Inject the contents of 2 syringes (300 mg total) under the skin every twenty-eight (28) days. 1 mL 4    triamcinolone (KENALOG) 0.1 % ointment Apply topically two (2) times a day as needed. Use on psoriasis. 454 g 1    dulaglutide (TRULICITY) 0.75 mg/0.5 mL injection pen Inject 0.75 mg under the skin. (Patient not taking: Reported on 05/21/2021)      empty container Misc use as directed to dispose of Cosentyx 1 each 2    gabapentin (NEURONTIN) 600 MG tablet Take 1 tablet (600 mg total) by mouth.       No current facility-administered medications for this visit.         Physical Exam:  -Vitals:   BP 178/97  - Pulse 92  - Wt (!) 140.9 kg (310 lb 9.6 oz)  - LMP 07/08/2021  - BMI 45.87 kg/m??   GEN: WD, WN, NAD.  A+ O x 3, good mood and affect.  ABD:  NT, ND.  Soft, no masses.  No hernias noted.  PELVIC:  No urethral lesions or discharge.  No urethral or bladder tenderness.  Vaginal rugae is minimally estrogenized without gross lesions.  Cervix is parous, NT and without lesions or discharge.  Anus and perineum are normal.   EXTR:  No c/c/e  NEURO:  CN 2-12 grossly intact, normal gait    PROCEDURE:  1.  Urine Pregnancy Test:  Negative  2.  ECC: After verbal consent was obtained, I placed an endocerical currette through the external os and performed a currettage around the entire canal. She tolerted it well.   3.  Endometrial biopsy:  After verbal consent was given, she was placed in lithotomy position and a plastic speculum was inserted.  The cervix was visualized and swabbed 3 times with betadine sticks.      A time out was then performed anda Pipelle biopsy curette was then used to sound to 8 cm.  A total of 1 pass was made with minimal tissue.      The instruments were removed with good hemostasis.  She tolerated the procedure well.    4. Colposcopy performed with 4% acetic acid after verbal and written consent obtained               -Satisfactory: yes               -Lesion(s) Identified:yes, FWE               -Location(s): 12 o'clock, about 4-5 mm above TZ               -Biopsy performed at 12 o'clock after time out performed      -Biopsy sites made hemostatic with pressure and silver nitrate    ASSESSMENT:  Morgan Edwards is a 46 y.o. [phone] here for   1. HPV in female    2. Abnormal cervical Papanicolaou smear, unspecified abnormal pap finding    3. Abnormal uterine bleeding (AUB)    .    PLAN:  1.  I discussed the use of HPV to determine pap smear screening.  I also explained that with her HIV, even though well controlled, we are always a bit more determined to work-up any abnormal findings.  If the pathology is negative, I would recommend a repeat pap smear in 06/2022 with targeted HPV testing, but then reflex testing for Type 16/18 if HR HPV positive.       2.  Given her body habitus and smoking, her irregular bleeding is somewhat worrisome for endometrial hyperplasia.  I have performed an endometrial biopsy on her today and sent her for a pelvic US.  She will return in 5-6 weeks to review.  She has stated she would like her uterus out due to her AUB.  I am hopeful that a LNG-IUD will control her bleeding, protect her against endometrial cancer and allow [place] to avoid having to attempt a hysterectomy on her due to her other comorbidities.  I have discussed with her that we would not recommend removing her ovaries with a hysterectomy as she is not a good candidate for ERT due to her smoking.    I spent 40 minutes in face-to-face communication with Morgan Edwards beyond the colposcopy procedure, greater than 50% of which was in counseling and education.

## 2021-07-12 ENCOUNTER — Ambulatory Visit
Admit: 2021-07-12 | Discharge: 2021-07-13 | Payer: BLUE CROSS/BLUE SHIELD | Attending: Obstetrics & Gynecology | Primary: Obstetrics & Gynecology

## 2021-07-12 DIAGNOSIS — R87619 Unspecified abnormal cytological findings in specimens from cervix uteri: Principal | ICD-10-CM

## 2021-07-12 DIAGNOSIS — N939 Abnormal uterine and vaginal bleeding, unspecified: Principal | ICD-10-CM

## 2021-07-12 DIAGNOSIS — B977 Papillomavirus as the cause of diseases classified elsewhere: Principal | ICD-10-CM

## 2021-07-12 DIAGNOSIS — R87810 Cervical high risk human papillomavirus (HPV) DNA test positive: Secondary | ICD-10-CM | POA: Diagnosis not present

## 2021-07-12 DIAGNOSIS — D069 Carcinoma in situ of cervix, unspecified: Secondary | ICD-10-CM | POA: Diagnosis not present

## 2021-07-12 NOTE — Unmapped (Signed)
[facility] Radiology Department (To schedule your ultrasound or mammogram)      Ultrasound or other imaging: [phone], option #1

## 2021-07-22 DIAGNOSIS — L409 Psoriasis, unspecified: Principal | ICD-10-CM

## 2021-07-22 MED ORDER — COSENTYX PEN 300 MG/2 PENS (150 MG/ML) SUBCUTANEOUS
SUBCUTANEOUS | 0 refills | 35 days
Start: 2021-07-22 — End: 2021-08-20

## 2021-07-22 NOTE — Unmapped (Signed)
[facility] Refill Coordination Note    Specialty Medication(s) to be Shipped:   Inflammatory Disorders: Cosentyx    Other medication(s) to be shipped:  empty container     Morgan Edwards, DOB: [birth year]  Phone: [phone] (home)       All above HIPAA information was verified with patient.     Was a Nurse, learning disability used for this call? No    Completed refill call assessment today to schedule patient's medication shipment from the [facility] [phone]).  All relevant notes have been reviewed.     Specialty medication(s) and dose(s) confirmed: Regimen is correct and unchanged.   Changes to medications: Morgan Edwards reports no changes at this time.  Changes to insurance: No  New side effects reported not previously addressed with a pharmacist or physician: None reported  Questions for the pharmacist: No    Confirmed patient received a Conservation officer, historic buildings and a Surveyor, mining with first shipment. The patient will receive a drug information handout for each medication shipped and additional FDA Medication Guides as required.       DISEASE/MEDICATION-SPECIFIC INFORMATION        For patients on injectable medications: Patient currently has 0 doses left.  Next injection is scheduled for 08/11/21.    SPECIALTY MEDICATION ADHERENCE     Medication Adherence    Patient reported X missed doses in the last month: 0  Specialty Medication: Cosentyx 150mg /ml  Patient is on additional specialty medications: No              Were doses missed due to medication being on hold? No    Cosentyx 150 mg/ml: 0 days of medicine on hand       REFERRAL TO PHARMACIST     Referral to the pharmacist: Not needed      [facility]     Shipping address confirmed in Epic.     Delivery Scheduled: Yes, Expected medication delivery date: 08/01/21.     Medication will be delivered via UPS to the prescription address in Epic WAM.    Morgan Edwards   [facility] Specialty Technician

## 2021-07-23 MED ORDER — COSENTYX PEN 300 MG/2 PENS (150 MG/ML) SUBCUTANEOUS
SUBCUTANEOUS | 0 refills | 35 days
Start: 2021-07-23 — End: 2021-08-21

## 2021-07-26 ENCOUNTER — Ambulatory Visit: Payer: Medicaid Other | Admitting: Family

## 2021-07-26 ENCOUNTER — Encounter: Payer: Self-pay | Admitting: Family

## 2021-07-26 VITALS — BP 129/84 | HR 97 | Temp 98.0°F | Ht 67.0 in | Wt 313.6 lb

## 2021-07-26 DIAGNOSIS — E1159 Type 2 diabetes mellitus with other circulatory complications: Secondary | ICD-10-CM | POA: Diagnosis not present

## 2021-07-26 DIAGNOSIS — Z6841 Body Mass Index (BMI) 40.0 and over, adult: Secondary | ICD-10-CM

## 2021-07-26 DIAGNOSIS — E1142 Type 2 diabetes mellitus with diabetic polyneuropathy: Secondary | ICD-10-CM | POA: Diagnosis not present

## 2021-07-26 DIAGNOSIS — I152 Hypertension secondary to endocrine disorders: Secondary | ICD-10-CM

## 2021-07-26 DIAGNOSIS — E1169 Type 2 diabetes mellitus with other specified complication: Secondary | ICD-10-CM

## 2021-07-26 DIAGNOSIS — E785 Hyperlipidemia, unspecified: Secondary | ICD-10-CM | POA: Diagnosis not present

## 2021-07-26 MED ORDER — SEMAGLUTIDE (1 MG/DOSE) 4 MG/3ML ~~LOC~~ SOPN: 1.0000 mg | PEN_INJECTOR | SUBCUTANEOUS | 0 refills | Status: DC

## 2021-07-26 MED ORDER — OZEMPIC (2 MG/DOSE) 8 MG/3ML ~~LOC~~ SOPN: 2.0000 mg | PEN_INJECTOR | SUBCUTANEOUS | 0 refills | Status: DC

## 2021-07-26 NOTE — Patient Instructions (Signed)

## 2021-07-26 NOTE — Progress Notes (Signed)
Subjective:    Patient ID: Jasmine Peck, female    DOB: 31-May-1975, 46 y.o.   MRN: [id number]  Chief Complaint  Patient presents with   Follow-up    Ozempic   PT presents to the office today follow up on diabetes. She was started on Ozempic 0.25 mg weekly for one month and then increased to 0.5 mg. She has tolerated this well. Her glucose has been 95. She has lost 1 lb since our last visit.  Diabetes She presents for her follow-up diabetic visit. She has type 2 diabetes mellitus. Associated symptoms include foot paresthesias. Pertinent negatives for diabetes include no blurred vision. Symptoms are stable. Risk factors for coronary artery disease include diabetes mellitus, dyslipidemia, hypertension and sedentary lifestyle. She is following a generally unhealthy diet. Her overall blood glucose range is 90-110 mg/dl.  Hypertension This is a chronic problem. The current episode started more than 1 year ago. The problem has been resolved since onset. The problem is controlled. Associated symptoms include malaise/fatigue and peripheral edema. Pertinent negatives include no blurred vision or shortness of breath. Risk factors for coronary artery disease include dyslipidemia, diabetes mellitus, obesity and sedentary lifestyle. The current treatment provides moderate improvement.  Hyperlipidemia This is a chronic problem. The current episode started more than 1 year ago. The problem is uncontrolled. Exacerbating diseases include obesity. Pertinent negatives include no shortness of breath. Current antihyperlipidemic treatment includes diet change. The current treatment provides moderate improvement of lipids.      Review of Systems  Constitutional:  Positive for malaise/fatigue.  Eyes:  Negative for blurred vision.  Respiratory:  Negative for shortness of breath.   All other systems reviewed and are negative.      Objective:   Physical Exam Vitals reviewed.  Constitutional:      General:  She is not in acute distress.    Appearance: She is well-developed. She is obese.  HENT:     Head: Normocephalic and atraumatic.     Right Ear: Tympanic membrane normal.     Left Ear: Tympanic membrane normal.  Eyes:     Pupils: Pupils are equal, round, and reactive to light.  Neck:     Thyroid: No thyromegaly.  Cardiovascular:     Rate and Rhythm: Normal rate and regular rhythm.     Heart sounds: Normal heart sounds. No murmur heard. Pulmonary:     Effort: Pulmonary effort is normal. No respiratory distress.     Breath sounds: Normal breath sounds. No wheezing.  Abdominal:     General: Bowel sounds are normal. There is no distension.     Palpations: Abdomen is soft.     Tenderness: There is no abdominal tenderness.  Musculoskeletal:        General: No tenderness. Normal range of motion.     Cervical back: Normal range of motion and neck supple.  Skin:    General: Skin is warm and dry.  Neurological:     Mental Status: She is alert and oriented to person, place, and time.     Cranial Nerves: No cranial nerve deficit.     Deep Tendon Reflexes: Reflexes are normal and symmetric.  Psychiatric:        Behavior: Behavior normal.        Thought Content: Thought content normal.        Judgment: Judgment normal.       BP 129/84   Pulse 97   Temp 98 F (36.7 C)   Ht '5\' 7"'$  (  1.702 m)   Wt (!) 313 lb 9.6 oz (142.2 kg)   SpO2 98%   BMI 49.12 kg/m      Assessment & Plan:  Zuleima Haser comes in today with chief complaint of Follow-up (Ozempic)   Diagnosis and orders addressed:  1. Type 2 diabetes mellitus with diabetic polyneuropathy, without long-term current use of insulin (HCC) Will increase Ozempic to 1 mg for one month then increase to 2 mg Strict low carb diet Encourage exercise  - Semaglutide, 1 MG/DOSE, 4 MG/3ML SOPN; Inject 1 mg as directed once a week.  Dispense: 3 mL; Refill: 0 - Semaglutide, 2 MG/DOSE, (OZEMPIC, 2 MG/DOSE,) 8 MG/3ML SOPN; Inject 2 mg into  the skin once a week.  Dispense: 3 mL; Refill: 0  2. Hyperlipidemia associated with type 2 diabetes mellitus ([place])  3. Diabetic peripheral neuropathy ([place])  4. Hypertension associated with diabetes ([place])  5. Morbid obesity with BMI of 45.0-49.9, adult [facility])    Labs pending Health Maintenance reviewed Diet and exercise encouraged  Follow up plan: 2 months    [name], FNP

## 2021-07-31 ENCOUNTER — Other Ambulatory Visit: Payer: Self-pay | Admitting: Family

## 2021-07-31 DIAGNOSIS — I152 Hypertension secondary to endocrine disorders: Secondary | ICD-10-CM

## 2021-07-31 DIAGNOSIS — K219 Gastro-esophageal reflux disease without esophagitis: Secondary | ICD-10-CM

## 2021-07-31 MED FILL — EMPTY CONTAINER: 120 days supply | Qty: 1 | Fill #1

## 2021-07-31 MED FILL — COSENTYX 150 MG/ML SUBCUTANEOUS SYRINGE: SUBCUTANEOUS | 28 days supply | Qty: 2 | Fill #0

## 2021-08-02 NOTE — Unmapped (Signed)
Morgan Edwards reports her Cosentyx therapy is going well - her skin has cleared tremendously and she's had no new flaring.     She's noticed a little more nasal drainage but no overt infections.     [facility] Clinical Assessment & Refill Coordination Note    Morgan Edwards, DOB: 28-Feb-1975  Phone: [phone] (home)     All above HIPAA information was verified with patient.     Was a Nurse, learning disability used for this call? No    Specialty Medication(s):   Inflammatory Disorders: Cosentyx     Current Outpatient Medications   Medication Sig Dispense Refill    ACCU-CHEK SOFTCLIX LANCETS lancets CHECK BLOOD SUGAR EVERY MORNING AS DIRECTED 100 each 5    amLODIPine (NORVASC) 10 MG tablet Take 1 tablet (10 mg total) by mouth daily.      blood sugar diagnostic (ACCU-CHEK AVIVA) Strp by Other route every morning. 50 each 6    cetirizine (ZYRTEC) 10 MG tablet Take 1 tablet (10 mg total) by mouth daily.      clobetasoL (TEMOVATE) 0.05 % cream APPLY ONE APPLICATION TOPICALLY TWICE DAILY      clobetasoL (TEMOVATE) 0.05 % ointment       clobetasoL (TEMOVATE) 0.05 % ointment       dulaglutide (TRULICITY) 0.75 mg/0.5 mL injection pen Inject 0.75 mg under the skin. (Patient not taking: Reported on 05/21/2021)      empty container Misc use as directed to dispose of Cosentyx 1 each 2    fluticasone propionate (FLONASE) 50 mcg/actuation nasal spray USE TWO SPRAYS IN EACH NOSTRIL EVERY DAY      furosemide (LASIX) 20 MG tablet       gabapentin (NEURONTIN) 600 MG tablet       gabapentin (NEURONTIN) 600 MG tablet Take 1 tablet (600 mg total) by mouth.      lisinopriL (PRINIVIL,ZESTRIL) 20 MG tablet Take 1 tablet (20 mg total) by mouth daily.      omeprazole (PRILOSEC) 20 MG capsule Take 1 capsule (20 mg total) by mouth daily.      secukinumab (COSENTYX) 150 mg/mL Syrg Inject the contents of 2 syringes (300 mg total) under the skin every twenty-eight (28) days. 2 mL 4    triamcinolone (KENALOG) 0.1 % ointment Apply topically two (2) times a day as needed. Use on psoriasis. 454 g 1     No current facility-administered medications for this visit.        Changes to medications: Christabelle reports no changes at this time.    Allergies   Allergen Reactions    Clindamycin Rash    Erythromycin Diarrhea, Other (See Comments) and Itching     Abdominal pain       Changes to allergies: No    SPECIALTY MEDICATION ADHERENCE     Cosentyx - 1 dose for this month (already scheduled and shipped before clinical)  Medication Adherence    Patient reported X missed doses in the last month: 0  Specialty Medication: Cosentyx          Specialty medication(s) dose(s) confirmed: Regimen is correct and unchanged.     Are there any concerns with adherence? No    Adherence counseling provided? Not needed    CLINICAL MANAGEMENT AND INTERVENTION      Clinical Benefit Assessment:    Do you feel the medicine is effective or helping your condition? Yes    Clinical Benefit counseling provided? Not needed    Adverse Effects Assessment:  Are you experiencing any side effects? No    Are you experiencing difficulty administering your medicine? No    Quality of Life Assessment:    Quality of Life    Rheumatology  Oncology  Dermatology  1. What impact has your specialty medication had on the symptoms of your skin condition (i.e. itchiness, soreness, stinging)?: Tremendous  2. What impact has your specialty medication had on your comfort level with your skin?: Tremendous  Cystic Fibrosis          Have you discussed this with your provider? Not needed    Acute Infection Status:    Acute infections noted within Epic:  MRSA  Patient reported infection: None    Therapy Appropriateness:    Is therapy appropriate and patient progressing towards therapeutic goals? Yes, therapy is appropriate and should be continued    DISEASE/MEDICATION-SPECIFIC INFORMATION      For patients on injectable medications: Patient currently has 1 doses left.  Next injection is scheduled for 7/30.    PATIENT SPECIFIC NEEDS     Does the patient have any physical, cognitive, or cultural barriers? No    Is the patient high risk? No    Does the patient require a Care Management Plan? No     SOCIAL DETERMINANTS OF HEALTH     At the [facility], we have learned that life circumstances - like trouble affording food, housing, utilities, or transportation can affect the health of many of our patients.   That is why we wanted to ask: are you currently experiencing any life circumstances that are negatively impacting your health and/or quality of life? Patient declined to answer    Social Determinants of Health     Financial Resource Strain: Not on file   Internet Connectivity: Not on file   Food Insecurity: Not on file   Tobacco Use: High Risk    Smoking Tobacco Use: Every Day    Smokeless Tobacco Use: Never    Passive Exposure: Not on file   Housing/Utilities: Not on file   Alcohol Use: Not on file   Transportation Needs: Not on file   Substance Use: Not on file   Health Literacy: Not on file   Physical Activity: Not on file   Interpersonal Safety: Not on file   Stress: Not on file   Intimate Partner Violence: Not on file   Depression: Not at risk    PHQ-2 Score: 0   Social Connections: Not on file       Would you be willing to receive help with any of the needs that you have identified today? Not applicable       SHIPPING     Specialty Medication(s) to be Shipped: na - no shipment at this time    Other medication(s) to be shipped: No additional medications requested for fill at this time     Changes to insurance: No    Delivery Scheduled: Patient declined refill at this time due to  has dose for 7/30.     Medication will be delivered via UPS to the confirmed prescription address in [facility].    The patient will receive a drug information handout for each medication shipped and additional FDA Medication Guides as required.  Verified that patient has previously received a Conservation officer, historic buildings and a Surveyor, mining.    The patient or caregiver noted above participated in the development of this care plan and knows that they can request review of or adjustments  to the care plan at any time.      All of the patient's questions and concerns have been addressed.    Lanney Gins   [facility] Specialty Pharmacist

## 2021-08-09 ENCOUNTER — Telehealth: Payer: Self-pay

## 2021-08-09 NOTE — Telephone Encounter (Signed)
Diclofenac is not covered by patient's insurance.  Covered alternatives are:  Celebrex capsule Ibuprofen tablet Indomethacin capsule Ketorolac tablet Meloxicam tablet Naproxen EC/DR tablet Naproxen tablet

## 2021-08-09 NOTE — Telephone Encounter (Signed)
PA sent to plan for Diclofenac

## 2021-08-12 MED ORDER — MELOXICAM 15 MG PO TABS: 15.0000 mg | ORAL_TABLET | Freq: Every day | ORAL | 1 refills | Status: DC

## 2021-08-12 NOTE — Telephone Encounter (Signed)
Diclofenac changed to Mobic per insurance.

## 2021-08-12 NOTE — Telephone Encounter (Signed)
Patient aware and verbalized understanding. °

## 2021-08-20 NOTE — Unmapped (Unsigned)
Referring Provider:  Jodi Marble, [facility], [facility]    CC: negative pap, HPV HR + (untyped)    HPI:   Morgan Edwards is a 46 y.o.  [phone]  who presents today history of AUB.  I saw her 07/12/21 for evaluation and management of abnormal cervical cytology.  At that visit she noted for the last few years she is having heavy vaginal bleeding every 2-6 weeks.      She had her pelvic US earlier today (08/21/2021) that showed:        *** I have looked at the images and feel that     LMP: No LMP recorded.  Using abstinence for now for contraception    Hx of HPV vaccination: no  Sexually active now:  Not currently.   Partners are female  Hx of STI's:  History of Human Papillomavirus - HPV and HIV  Hx of HIV: yes  Hx of HIV testing: yes, RNA undetectable 05/22/21    Tobacco Use:     -Current: yes, 1/2 ppd    -Past: yes    --How many years: 25    Menses:  Irregular  Last MMG:  12/2020, Birads 1      Dysplasia History:    07/2014: ASC-H, HPV pos  09/2014: CIN 1 at 9 o'clock, ECC neg  10/2015: ASC-H, HPV pos  02/2018, pap smear done, says she was told it was fine  02/2021:  negative cells, HPV pos  07/12/2021:  Colpo and Endometrial biopsy  A: Cervix, endocervix curettage  - High grade squamous intraepithelial lesion (CIN 2 and CIN 3), present as detached and partial fragments, see comment  -Fragments of benign endocervical glands and benign endocervical glandular tissue with acute cervicitis and reactive epithelial changes     B: Cervix, biopsy  -Low grade squamous intraepithelial lesion (CIN 1)  -Scant fragments of benign endocervical glands     C: Endometrium, curettage  -Proliferative endometrium with glandular and stromal breakdown           ROS:  A 12 point review of systems was negative except for pertinent items noted in the HPI.      OB History   Gravida Para Term Preterm AB Living   4 3 3   1 3    SAB IAB Ectopic Molar Multiple Live Births   1         3      # Outcome Date GA Lbr Len/2nd Weight Sex Delivery Anes PTL Lv   4 Term 05/14/13 [redacted]w[redacted]d  3841 g (8 lb 7.5 oz)  Vag-Spont   LIV   3 Term 10/08/00 [redacted]w[redacted]d  3714 g (8 lb 3 oz) F Vag-Spont EPI  LIV   2 Term 07/12/92 [redacted]w[redacted]d  3657 g (8 lb 1 oz) F Vag-Spont   LIV   1 SAB               Obstetric Comments   Induced at 37 weeks for both children due to large babies       Past Medical History:   Diagnosis Date    Anxiety and depression 12/06/2012    2014-11 reports chronic depression 2nd to multiple traumatic experiences.      Cervical dysplasia 10/26/2015    Cervical Dysplasia 07/2014: ASC-H, HPV pos 09/2014: CIN 1 at 9 o'clock, ECC neg 10/2015: ASC-H, HPV pos 12/2015: Colpo at 12 o clock, ECC, both negative    Contraception 01/24/2013    2015-05 declined btl at  time of delivery; plans to be abstinent 2015-01 Wants BTL. Signed medicaid paper 01/24/13     Current smoker 06/18/2016    Depression, recurrent (CMS-HCC) 12/06/2012    Diabetes mellitus, type 2 (CMS-HCC) 11/23/2012    No meds at start of pregnancy -> glyburide 2.5 mg qhs -> glyburide 5 mg qhs A1c 5.1 (Jan 2015)    Diabetic peripheral neuropathy (CMS-HCC) 06/18/2016    Diabetic polyneuropathy associated with type 2 diabetes mellitus (CMS-HCC) 04/19/2015    Essential hypertension 11/23/2012    On 100mg  Labetalol BID at start of pregnancy Baseline HELLP labs wnl    GAD (generalized anxiety disorder) 04/04/2014    GERD (gastroesophageal reflux disease) since 2010    HIV disease (CMS-HCC) 2014    Dx in pregnancy    Hx of cholecystectomy 03/14/2019    Hypertension complicating pregnancy, childbirth, and the puerperium 11/23/2012    On 100mg  Labetalol BID at start of pregnancy Baseline HELLP labs wnl     MRSA (methicillin resistant Staphylococcus aureus)     Obesity, morbid with BMI of 45.0-49.9, adult 11/23/2012    BMI 49 at start of pregnancy TWG- 12 kg (4/2) Monthly growth [place]  [ ]  Consider ppx lovenox while in house after delivery     Obstructive sleep apnea 02/01/2013    2015-02 CPAP titration suggest 10, 11 , 12 pressure. 2015-01 sleep study = c/w moderate obstructive sleep apnea      ORSA skin lesions 11/25/2012    + ORSA culture from abdominal wound, prescribed clindamycin on 11/12     Panic attack as reaction to stress 11/03/2013    Psoriasis     PTSD w/ depression 12/06/2012    2014-11 reports chronic depression 2nd to multiple traumatic experiences.      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) (Mar 09604)  Tu    Tobacco use 11/23/2012    1/2ppd at start of pregnancy -> 3 cig/day Patches don't work  4/2 - not motivated to quit     Tonsil and adenoid disease, chronic     Urinary tract infection     Vitamin D deficiency 04/05/2014       Past Surgical History:   Procedure Laterality Date    ABSCESS DRAINAGE      CHOLECYSTECTOMY  2000    [facility]    DENTAL SURGERY      DILATION AND CURETTAGE OF UTERUS  [phone]    x2 for heavy bleeding    FULL DENT RESTOR:MAY INCL ORAL EXM;DENT XRAYS;PROPHY/FL TX;DENT RESTOR;PULP TX;DENT EXTR;DENT AP Bilateral 11/27/2014    Procedure: Full Dental Restor:May Incl Oral Exam;Dent Xrays;Prophy/Fl Tx;Dent Restor;Pulp Tx;Dent Extr;Dent Appliances;  Surgeon: Roderick Pee, DDS;  Location: MAIN OR [facility];  Service: Oral Medicine       SOCIAL HISTORY:    Social History     Substance and Sexual Activity   Alcohol Use No    Alcohol/week: 0.0 standard drinks of alcohol       Social History     Substance and Sexual Activity  Drug Use No       Occupation:  Conservation officer, 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  Personal Information:  Single    Family History   Problem Relation Age of Onset    Diabetes Mother     Heart disease Mother     Diabetes Father     Clotting disorder Neg Hx     Anesthesia problems Neg Hx     Melanoma Neg Hx     Basal cell carcinoma Neg Hx     Squamous cell carcinoma Neg Hx     Celiac disease Neg Hx     Cholelithiasis Neg Hx     Cirrhosis Neg Hx     Colorectal Cancer Neg Hx     Crohn's disease Neg Hx     Esophageal cancer Neg Hx     Hemochromatosis Neg Hx     Inflammatory bowel disease Neg Hx     Irritable bowel syndrome Neg Hx     Liver cancer Neg Hx     Pancreatic cancer Neg Hx     Pancreatitis Neg Hx     Stomach cancer Neg Hx     Ulcerative colitis Neg Hx        ALLERGIES:  Clindamycin and Erythromycin    Current Outpatient Medications   Medication Sig Dispense Refill    ACCU-CHEK SOFTCLIX LANCETS lancets CHECK BLOOD SUGAR EVERY MORNING AS DIRECTED 100 each 5    amLODIPine (NORVASC) 10 MG tablet Take 1 tablet (10 mg total) by mouth daily.      blood sugar diagnostic (ACCU-CHEK AVIVA) Strp by Other route every morning. 50 each 6    cetirizine (ZYRTEC) 10 MG tablet Take 1 tablet (10 mg total) by mouth daily.      clobetasoL (TEMOVATE) 0.05 % cream APPLY ONE APPLICATION TOPICALLY TWICE DAILY      clobetasoL (TEMOVATE) 0.05 % ointment       clobetasoL (TEMOVATE) 0.05 % ointment       dulaglutide (TRULICITY) 0.75 mg/0.5 mL injection pen Inject 0.75 mg under the skin. (Patient not taking: Reported on 05/21/2021)      empty container Misc use as directed to dispose of Cosentyx 1 each 2    fluticasone propionate (FLONASE) 50 mcg/actuation nasal spray USE TWO SPRAYS IN EACH NOSTRIL EVERY DAY      furosemide (LASIX) 20 MG tablet       gabapentin (NEURONTIN) 600 MG tablet       gabapentin (NEURONTIN) 600 MG tablet Take 1 tablet (600 mg total) by mouth.      lisinopriL (PRINIVIL,ZESTRIL) 20 MG tablet Take 1 tablet (20 mg total) by mouth daily.      omeprazole (PRILOSEC) 20 MG capsule Take 1 capsule (20 mg total) by mouth daily.      secukinumab (COSENTYX) 150 mg/mL Syrg Inject the contents of 2 syringes (300 mg total) under the skin every twenty-eight (28) days. 2 mL 4    triamcinolone (KENALOG) 0.1 % ointment Apply topically two (2) times a day as needed. Use on psoriasis. 454 g 1     No current facility-administered medications for this visit.         Physical Exam:  -Vitals:   There were no vitals taken for this visit.  GEN: WD, WN, NAD.  A+ O x 3, good mood and affect.  ABD:  NT, ND.  Soft, no masses.  No hernias noted.  PELVIC:  No urethral lesions or discharge.  No urethral or bladder tenderness.  Vaginal  rugae is minimally estrogenized without gross lesions.  Cervix is parous, NT and without lesions or discharge.  Anus and perineum are normal.   EXTR:  No c/c/e  NEURO:  CN 2-12 grossly intact, normal gait      ASSESSMENT:  Miles Gade is a 46 y.o. [phone] here for   No diagnosis found.  Marland Kitchen    PLAN:  1.  I discussed the use of HPV to determine pap smear screening.  I also explained that with her HIV, even though well controlled, we are always a bit more determined to work-up any abnormal findings.  If the pathology is negative, I would recommend a repeat pap smear in 06/2022 with targeted HPV testing, but then reflex testing for Type 16/18 if HR HPV positive.       2.  Given her body habitus and smoking, her irregular bleeding is somewhat worrisome for endometrial hyperplasia.  I have performed an endometrial biopsy on her today and sent her for a pelvic US.  She will return in 5-6 weeks to review.  She has stated she would like her uterus out due to her AUB.  I am hopeful that a LNG-IUD will control her bleeding, protect her against endometrial cancer and allow [place] to avoid having to attempt a hysterectomy on her due to her other comorbidities.  I have discussed with her that we would not recommend removing her ovaries with a hysterectomy as she is not a good candidate for ERT due to her smoking.    I spent 40 minutes in face-to-face communication with Sherry Ruffing beyond the colposcopy procedure, greater than 50% of which was in counseling and education.

## 2021-08-21 ENCOUNTER — Ambulatory Visit: Admit: 2021-08-21 | Discharge: 2021-08-21 | Payer: BLUE CROSS/BLUE SHIELD

## 2021-08-21 ENCOUNTER — Ambulatory Visit
Admit: 2021-08-21 | Discharge: 2021-08-21 | Payer: BLUE CROSS/BLUE SHIELD | Attending: Obstetrics & Gynecology | Primary: Obstetrics & Gynecology

## 2021-08-21 DIAGNOSIS — N939 Abnormal uterine and vaginal bleeding, unspecified: Principal | ICD-10-CM

## 2021-08-21 DIAGNOSIS — D069 Carcinoma in situ of cervix, unspecified: Principal | ICD-10-CM

## 2021-08-21 DIAGNOSIS — Z6841 Body Mass Index (BMI) 40.0 and over, adult: Secondary | ICD-10-CM | POA: Diagnosis not present

## 2021-08-21 DIAGNOSIS — Z87891 Personal history of nicotine dependence: Secondary | ICD-10-CM | POA: Diagnosis not present

## 2021-08-21 DIAGNOSIS — N888 Other specified noninflammatory disorders of cervix uteri: Secondary | ICD-10-CM | POA: Diagnosis not present

## 2021-08-21 DIAGNOSIS — I10 Essential (primary) hypertension: Secondary | ICD-10-CM | POA: Diagnosis not present

## 2021-08-21 DIAGNOSIS — Z79899 Other long term (current) drug therapy: Secondary | ICD-10-CM | POA: Diagnosis not present

## 2021-08-21 DIAGNOSIS — N83202 Unspecified ovarian cyst, left side: Secondary | ICD-10-CM | POA: Diagnosis not present

## 2021-08-21 DIAGNOSIS — E1142 Type 2 diabetes mellitus with diabetic polyneuropathy: Secondary | ICD-10-CM | POA: Diagnosis not present

## 2021-08-21 DIAGNOSIS — R87619 Unspecified abnormal cytological findings in specimens from cervix uteri: Secondary | ICD-10-CM | POA: Diagnosis not present

## 2021-08-21 DIAGNOSIS — Z881 Allergy status to other antibiotic agents status: Secondary | ICD-10-CM | POA: Diagnosis not present

## 2021-08-21 DIAGNOSIS — F411 Generalized anxiety disorder: Secondary | ICD-10-CM | POA: Diagnosis not present

## 2021-08-23 DIAGNOSIS — L409 Psoriasis, unspecified: Principal | ICD-10-CM

## 2021-08-23 MED ORDER — COSENTYX 150 MG/ML SUBCUTANEOUS SYRINGE
SUBCUTANEOUS | 4 refills | 28 days | Status: CN
Start: 2021-08-23 — End: ?

## 2021-08-23 MED ORDER — SECUKINUMAB 150 MG/ML SUBCUTANEOUS PEN INJECTOR
SUBCUTANEOUS | 5 refills | 28.00000 days | Status: CP
Start: 2021-08-23 — End: ?
  Filled 2021-09-02: qty 2, 28d supply, fill #0

## 2021-08-23 NOTE — Unmapped (Signed)
[facility] Refill Coordination Note    8/11-sent a request for Cosentyx PENS to be filled on 8/21. Patient stated that she has been on pens before and prefers pens over syringes    Specialty Medication(s) to be Shipped:   Inflammatory Disorders: Cosentyx    Other medication(s) to be shipped: No additional medications requested for fill at this time     Morgan Edwards, DOB: 1975/10/03  Phone: [phone] (home)       All above HIPAA information was verified with patient.     Was a Nurse, learning disability used for this call? No    Completed refill call assessment today to schedule patient's medication shipment from the [facility] [phone]).  All relevant notes have been reviewed.     Specialty medication(s) and dose(s) confirmed: Regimen is correct and unchanged.   Changes to medications: Morgan Edwards reports no changes at this time.  Changes to insurance: No  New side effects reported not previously addressed with a pharmacist or physician: None reported  Questions for the pharmacist: No    Confirmed patient received a Conservation officer, historic buildings and a Surveyor, mining with first shipment. The patient will receive a drug information handout for each medication shipped and additional FDA Medication Guides as required.       DISEASE/MEDICATION-SPECIFIC INFORMATION        For patients on injectable medications: Patient currently has 0 doses left.  Next injection is scheduled for 09/08/2021.    SPECIALTY MEDICATION ADHERENCE     Medication Adherence    Patient reported X missed doses in the last month: 0  Specialty Medication: Cosentyx  Patient is on additional specialty medications: No  Any gaps in refill history greater than 2 weeks in the last 3 months: no  Demonstrates understanding of importance of adherence: yes  Informant: patient  Reliability of informant: reliable              Confirmed plan for next specialty medication refill: delivery by pharmacy  Refills needed for supportive medications: not needed              Were doses missed due to medication being on hold? No    Cosentyx 150 mg/ml: 0 days of medicine on hand       REFERRAL TO PHARMACIST     Referral to the pharmacist: Not needed      [facility]     Shipping address confirmed in Epic.     Delivery Scheduled: Yes, Expected medication delivery date: 09/03/2021.     Medication will be delivered via UPS to the prescription address in Epic WAM.    Morgan Edwards   [facility] Specialty Technician

## 2021-08-26 DIAGNOSIS — B20 Human immunodeficiency virus [HIV] disease: Secondary | ICD-10-CM | POA: Diagnosis not present

## 2021-09-25 NOTE — Unmapped (Signed)
[facility] Refill Coordination Note    Specialty Medication(s) to be Shipped:   Inflammatory Disorders: Cosentyx    Other medication(s) to be shipped: No additional medications requested for fill at this time     Morgan Edwards, DOB: 1976/01/14  Phone: [phone] (home)       All above HIPAA information was verified with patient.     Was a Nurse, learning disability used for this call? No    Completed refill call assessment today to schedule patient's medication shipment from the [facility] [phone]).  All relevant notes have been reviewed.     Specialty medication(s) and dose(s) confirmed: Regimen is correct and unchanged.   Changes to medications: Morgan Edwards reports no changes at this time.  Changes to insurance: No  New side effects reported not previously addressed with a pharmacist or physician: None reported  Questions for the pharmacist: No    Confirmed patient received a Conservation officer, historic buildings and a Surveyor, mining with first shipment. The patient will receive a drug information handout for each medication shipped and additional FDA Medication Guides as required.       DISEASE/MEDICATION-SPECIFIC INFORMATION        For patients on injectable medications: Patient currently has 0 doses left.  Next injection is scheduled for 10/12/21.    SPECIALTY MEDICATION ADHERENCE     Medication Adherence    Patient reported X missed doses in the last month: 0  Specialty Medication: Cosentyx  Patient is on additional specialty medications: No  Patient is on more than two specialty medications: No                                Were doses missed due to medication being on hold? No    COSENTYX PEN (2 PENS) 150  mg/ml: 0 days of medicine on hand       REFERRAL TO PHARMACIST     Referral to the pharmacist: Not needed      [facility]     Shipping address confirmed in Epic.     Delivery Scheduled: Yes, Expected medication delivery date: 10/03/21.     Medication will be delivered via UPS to the prescription address in Epic WAM.    Morgan Edwards   [facility] Specialty Technician

## 2021-09-30 ENCOUNTER — Ambulatory Visit: Payer: Medicaid Other | Admitting: Family

## 2021-10-01 ENCOUNTER — Encounter: Payer: Self-pay | Admitting: Family

## 2021-10-01 NOTE — Unmapped (Unsigned)
Dermatology Note     Assessment and Plan:      Benign Lesions/ Findings:   {derm skin check benign A&P:75424}  - Reassurance provided regarding the benign appearance of lesions noted on exam today; no treatment is indicated in the absence of symptoms/changes.  - Reinforced importance of photoprotective strategies including liberal and frequent sunscreen use of a broad-spectrum SPF 30 or greater, use of protective clothing, and sun avoidance for prevention of cutaneous malignancy and photoaging.  Counseled patient on the importance of regular self-skin monitoring as well as routine clinical skin examinations as scheduled.     Personal history of {derm skin check cancer list:75440} ***  - No evidence of recurrence at this time.  - Discussed maintaining vigilance and counseled on sun protection as above.    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 Has not tolerated methotrexate previously.  - Start triamcinolone (KENALOG) 0.1 % ointment; Apply topically two (2) times a day as needed. Use on psoriasis.  - Cosentyx    There are no diagnoses linked to this encounter.    The patient was advised to call for an appointment should any new, changing, or symptomatic lesions develop.     RTC: No follow-ups on file. or sooner as needed   _________________________________________________________________      Chief Complaint     Chief Complaint   Patient presents with   ??? Psoriasis     Follow up; pt reports improvement       HPI     Morgan Edwards is a 46 y.o. female who presents as a returning patient (last seen 05/22/2021) to Dermatology for follow up of psoriasis since starting cosentyx.     At last appt, cosentyx and triamcinolone ointment were prescribed.    The patient denies any other new or changing lesions or areas of concern.     Pertinent Past Medical History     Psoriasis  HIV RNA not detected May 2023  ID doctor at [facility], Dr. Jodi Marble  [address] [place], [place] [address], [phone]    Family History:   {derm skin check melanoma history:75452}    Past Medical History, Family History, Social History, Medication List, Allergies, and Problem List were reviewed in the rooming section of Epic.     ROS: Other than symptoms mentioned in the HPI, {derm skin check ros:75408::no fevers, chills, or other skin complaints}    Physical Examination     GENERAL: Well-appearing female in no acute distress, resting comfortably.  {PE systems:75418::NEURO: Alert and oriented, answers questions appropriately}  {PE extent:75514}  {PE list:75421}  ***    {PE limitations:75419::All areas not commented on are within normal limits or unremarkable}      (Approved Template 09/26/2019)

## 2021-10-02 ENCOUNTER — Ambulatory Visit
Admit: 2021-10-02 | Discharge: 2021-10-03 | Payer: BLUE CROSS/BLUE SHIELD | Attending: Student in an Organized Health Care Education/Training Program | Primary: Student in an Organized Health Care Education/Training Program

## 2021-10-02 DIAGNOSIS — L409 Psoriasis, unspecified: Principal | ICD-10-CM

## 2021-10-02 DIAGNOSIS — L853 Xerosis cutis: Principal | ICD-10-CM

## 2021-10-02 MED FILL — COSENTYX PEN 300 MG/2 PENS (150 MG/ML) SUBCUTANEOUS: SUBCUTANEOUS | 28 days supply | Qty: 2 | Fill #1

## 2021-10-02 NOTE — Unmapped (Signed)
It was nice to see you today! Your resident physician was Dr. Loletha Bertini    If any of your medications are too expensive, look for a coupon at GoodRx.com or reference the application on a smartphone  - Enter the medication name, size, and your zip code to find coupons for local pharmacies.   - Print a coupon and bring it to the pharmacy, or pull up the coupon on a smartphone.  - You can also call pharmacies to ask about the cost of your medication before you pick it up.  If you still cannot afford your medication, please let us know.     Please call our clinic at [phone] with any concerns or to schedule a follow up appointment. We look forward to seeing you again!    [facility] releases most results to you as soon as they are available. Therefore, you may see some results before we do. Please give us 2 business days to review the tests and contact you by phone or through MyChart. If you are concerned that some results may be upsetting or confusing, you may wish to wait until we contact you before looking at the report in MyChart. If you have an urgent question, you can send us a message or call our clinic. Otherwise, we prefer that you wait 2 business days for us to contact you.

## 2021-10-05 ENCOUNTER — Ambulatory Visit: Admit: 2021-10-05 | Discharge: 2021-10-06 | Payer: BLUE CROSS/BLUE SHIELD | Attending: Family | Primary: Family

## 2021-10-05 DIAGNOSIS — R051 Acute cough: Principal | ICD-10-CM

## 2021-10-05 DIAGNOSIS — J329 Chronic sinusitis, unspecified: Principal | ICD-10-CM

## 2021-10-05 MED ORDER — ALBUTEROL SULFATE HFA 90 MCG/ACTUATION AEROSOL INHALER
Freq: Four times a day (QID) | RESPIRATORY_TRACT | 0 refills | 0 days | Status: CP | PRN
Start: 2021-10-05 — End: 2022-10-05

## 2021-10-05 MED ORDER — PROMETHAZINE-DM 6.25 MG-15 MG/5 ML ORAL SYRUP
Freq: Four times a day (QID) | ORAL | 0 refills | 6 days | Status: CP | PRN
Start: 2021-10-05 — End: 2021-10-12

## 2021-10-05 MED ORDER — AMOXICILLIN 875 MG TABLET
ORAL_TABLET | Freq: Two times a day (BID) | ORAL | 0 refills | 10 days | Status: CP
Start: 2021-10-05 — End: ?

## 2021-10-05 MED ORDER — PREDNISONE 10 MG TABLET
ORAL_TABLET | 0 refills | 0 days | Status: CP
Start: 2021-10-05 — End: ?
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
## 2021-10-09 NOTE — Unmapped (Signed)
I discussed this patient's case including the history, clinical findings and assessment and plan with the resident physician. I reviewed the resident's note. I agree with the plan as documented in the resident's note. I was immediately available.     Makenly Larabee V Teegan Guinther, MD

## 2021-10-25 NOTE — Unmapped (Signed)
[name] Refill Coordination Note    Specialty Medication(s) to be Shipped:   Inflammatory Disorders: Cosentyx    Other medication(s) to be shipped: No additional medications requested for fill at this time     Morgan Edwards, DOB: 1975/09/15  Phone: [phone] (home)       All above HIPAA information was verified with patient.     Was a Nurse, learning disability used for this call? No    Completed refill call assessment today to schedule patient's medication shipment from the [facility] [phone]).  All relevant notes have been reviewed.     Specialty medication(s) and dose(s) confirmed: Regimen is correct and unchanged.   Changes to medications: Morgan Edwards reports no changes at this time.  Changes to insurance: No  New side effects reported not previously addressed with a pharmacist or physician: None reported  Questions for the pharmacist: No    Confirmed patient received a Conservation officer, historic buildings and a Surveyor, mining with first shipment. The patient will receive a drug information handout for each medication shipped and additional FDA Medication Guides as required.       DISEASE/MEDICATION-SPECIFIC INFORMATION        For patients on injectable medications: Patient currently has 0 doses left.  Next injection is scheduled for 10/28.    SPECIALTY MEDICATION ADHERENCE     Medication Adherence    Patient reported X missed doses in the last month: 0  Specialty Medication: Cosentyx  Patient is on additional specialty medications: No  Any gaps in refill history greater than 2 weeks in the last 3 months: no  Demonstrates understanding of importance of adherence: yes  Informant: patient  Reliability of informant: reliable              Confirmed plan for next specialty medication refill: delivery by pharmacy  Refills needed for supportive medications: not needed              Were doses missed due to medication being on hold? No    COSENTYX PEN (2 PENS) 150  mg/ml: 0 days of medicine on hand       REFERRAL TO PHARMACIST     Referral to the pharmacist: Not needed      [facility]     Shipping address confirmed in Epic.     Delivery Scheduled: Yes, Expected medication delivery date: 10/19.     Medication will be delivered via UPS to the prescription address in Epic WAM.    Morgan Edwards   [facility] Specialty Technician

## 2021-10-29 ENCOUNTER — Inpatient Hospital Stay: Payer: Medicaid Other | Attending: Physician Assistant

## 2021-10-29 DIAGNOSIS — D472 Monoclonal gammopathy: Secondary | ICD-10-CM | POA: Insufficient documentation

## 2021-10-29 DIAGNOSIS — F17210 Nicotine dependence, cigarettes, uncomplicated: Secondary | ICD-10-CM | POA: Diagnosis not present

## 2021-10-29 LAB — CBC WITH DIFFERENTIAL/PLATELET
Abs Immature Granulocytes: 0.01 10*3/uL (ref 0.00–0.07)
Basophils Absolute: 0 10*3/uL (ref 0.0–0.1)
Basophils Relative: 1 %
Eosinophils Absolute: 0.1 10*3/uL (ref 0.0–0.5)
Eosinophils Relative: 1 %
HCT: 41.5 % (ref 36.0–46.0)
Hemoglobin: 13.7 g/dL (ref 12.0–15.0)
Immature Granulocytes: 0 %
Lymphocytes Relative: 43 %
Lymphs Abs: 2.7 10*3/uL (ref 0.7–4.0)
MCH: 33.5 pg (ref 26.0–34.0)
MCHC: 33 g/dL (ref 30.0–36.0)
MCV: 101.5 fL — ABNORMAL HIGH (ref 80.0–100.0)
Monocytes Absolute: 0.5 10*3/uL (ref 0.1–1.0)
Monocytes Relative: 8 %
Neutro Abs: 3 10*3/uL (ref 1.7–7.7)
Neutrophils Relative %: 47 %
Platelets: 262 10*3/uL (ref 150–400)
RBC: 4.09 MIL/uL (ref 3.87–5.11)
RDW: 13.6 % (ref 11.5–15.5)
WBC: 6.2 10*3/uL (ref 4.0–10.5)
nRBC: 0 % (ref 0.0–0.2)

## 2021-10-29 LAB — LACTATE DEHYDROGENASE: LDH: 120 U/L (ref 98–192)

## 2021-10-29 LAB — COMPREHENSIVE METABOLIC PANEL
ALT: 21 U/L (ref 0–44)
AST: 21 U/L (ref 15–41)
Albumin: 3.7 g/dL (ref 3.5–5.0)
Alkaline Phosphatase: 66 U/L (ref 38–126)
Anion gap: 6 (ref 5–15)
BUN: 9 mg/dL (ref 6–20)
CO2: 26 mmol/L (ref 22–32)
Calcium: 8.7 mg/dL — ABNORMAL LOW (ref 8.9–10.3)
Chloride: 107 mmol/L (ref 98–111)
Creatinine, Ser: 0.75 mg/dL (ref 0.44–1.00)
GFR, Estimated: >60 mL/min (ref >60)
Glucose, Bld: 115 mg/dL — ABNORMAL HIGH (ref 70–99)
Potassium: 3.8 mmol/L (ref 3.5–5.1)
Sodium: 139 mmol/L (ref 135–145)
Total Bilirubin: 0.8 mg/dL (ref 0.3–1.2)
Total Protein: 7 g/dL (ref 6.5–8.1)

## 2021-10-30 LAB — KAPPA/LAMBDA LIGHT CHAINS
Kappa free light chain: 26.2 mg/L — ABNORMAL HIGH (ref 3.3–19.4)
Kappa, lambda light chain ratio: 1.75 — ABNORMAL HIGH (ref 0.26–1.65)
Lambda free light chains: 15 mg/L (ref 5.7–26.3)

## 2021-10-30 MED FILL — COSENTYX PEN 300 MG/2 PENS (150 MG/ML) SUBCUTANEOUS: SUBCUTANEOUS | 28 days supply | Qty: 2 | Fill #2

## 2021-11-05 ENCOUNTER — Encounter: Payer: Self-pay | Admitting: Physician Assistant

## 2021-11-05 ENCOUNTER — Inpatient Hospital Stay (HOSPITAL_BASED_OUTPATIENT_CLINIC_OR_DEPARTMENT_OTHER): Payer: Medicaid Other | Admitting: Physician Assistant

## 2021-11-05 ENCOUNTER — Ambulatory Visit (HOSPITAL_COMMUNITY)
Admission: RE | Admit: 2021-11-05 | Discharge: 2021-11-05 | Disposition: A | Discharge status: Home / Self Care | Payer: Medicaid Other | Source: Ambulatory Visit | Attending: Physician Assistant | Admitting: Physician Assistant

## 2021-11-05 DIAGNOSIS — R9389 Abnormal findings on diagnostic imaging of other specified body structures: Secondary | ICD-10-CM

## 2021-11-05 DIAGNOSIS — D472 Monoclonal gammopathy: Secondary | ICD-10-CM

## 2021-11-05 NOTE — Progress Notes (Signed)
Virtual Visit via Telephone Note [facility]  I connected with [place] Jasmine Peck  on 11/05/21  at  10:18 AM by telephone and verified that I am speaking with the correct person using two identifiers.  Location: Patient: Home Provider: [facility]   I discussed the limitations, risks, security and privacy concerns of performing an evaluation and management service by telephone and the availability of in person appointments. I also discussed with the patient that there may be a patient responsible charge related to this service. The patient expressed understanding and agreed to proceed.  REASON FOR VISIT: MGUS  PRIOR THERAPY: None  CURRENT THERAPY: Surveillance  INTERVAL HISTORY: [name] 46 year old female) is contacted today for follow-up of her MGUS.  She was last seen on 05/20/2021 by Tarri Abernethy PA-C.  At today's visit, she reports feeling fairly well.   No recent hospitalizations, surgeries, or changes in baseline health status.   She has chronic fatigue, which she says is work-related.  She denies any new bone pain or recent fractures, apart from some left hip arthritic pain.  She denies any B symptoms such as fever, chills, night sweats, unintentional weight loss.  No new neurologic symptoms such as tinnitus, new-onset hearing loss, blurred vision, headache, or dizziness.  Denies any numbness or tingling in hands or feet.  No thromboembolic events since her last visit.  No new masses or lymphadenopathy per her report.   She has 70% energy and 100% appetite. She endorses that she is maintaining a stable weight.   OBSERVATIONS/OBJECTIVE: Review of Systems  Constitutional:  Negative for chills, diaphoresis, fever, malaise/fatigue and weight loss.  Respiratory:  Positive for cough (Smoker's cough). Negative for shortness of breath.   Cardiovascular:  Negative for chest pain and palpitations.  Gastrointestinal:  Negative for abdominal  pain, blood in stool, melena, nausea and vomiting.  Neurological:  Negative for dizziness and headaches.     PHYSICAL EXAM (per limitations of virtual telephone visit): The patient is alert and oriented x 3, exhibiting adequate mentation, good mood, and ability to speak in full sentences and execute sound judgement.   ASSESSMENT & PLAN: 1.   IgA kappa MGUS: - Discovered during work-up by rheumatology for psoriatic arthritis - Initial work-up showed SPEP with M spike 0.4 and immunofixation showing IgA kappa.  LDH, UPEP with reflex, beta-2 microglobulin and serum free light chains all within normal limits. - Most recent skeletal survey (04/24/2021): Questionable faint small lucencies noted over both proximal humeri  - Most recent labs (10/29/2021): Stable M spike at 0.3.  Elevated kappa light chains 26.2, normal lambda 15.0, elevated ratio 1.75 (stable).  Normal LDH. - No CRAB features at this time: Hgb 13.7, creatinine 0.75, calcium 8.7 - She does not have any B symptoms, new bone pains.    - We discussed the normal pathophysiology of MGUS and 1% per year chance of transformation to myeloma. - PLAN: Patient is due for x-ray of humeri for follow-up of questionable lucencies seen on skeletal survey in April 2023. - Repeat MGUS panel in 6 months (PLUS skeletal survey) - RTC in 6 months for follow-up visit - We will consider bone marrow biopsy if she has any significant deviations from her baseline labs or develops any concerning B symptoms or CRAB features.   2.  Tonsillar calcifications - Skeletal survey (04/24/2021) revealed cervical spine calcifications over the region of the submandibular glands; calcified submandibular process including malignancy unable to be excluded. - Physical exam  did not reveal any abnormalities when she was seen in office on 05/01/2021 - She smokes 1 pack/day cigarettes x25 years - Maxillofacial CT scan (05/13/2021): Radiographic abnormality correlates with calcifications  within the enlarged palatine tonsils; negative for mass or salivary calculus; calcifications correlate with bilateral palatine tonsillitis. - PLAN: Discussed with patient that these findings are benign, but if they are bothersome or symptomatic, she can see her dentist or be referred to ENT.   3.  Psoriatic arthritis: - Previously started on methotrexate and topical creams.  Unfortunately developed an anaphylactic reaction to methotrexate.  This was discontinued.   -- She is now taking Cosentyx - PLAN: Continue follow-up with rheumatology   4.   HIV: - Followed by infectious disease every 6 months. - Well controlled-viral load undetectable per patient. - PLAN: Continue follow-up with infectious disease.   5.  Social history: - No family history of blood disorders or cancer per patient. - Distant family relatives had esophageal cancer. - Current everyday smoker for the past 25 years.  Approximately 1 pack/day. - She does not yet meet criteria for LDCT lung cancer screening due to age < 65     PLAN SUMMARY & DISPOSITION: Bilateral humerus x-ray (will call with results) Labs PLUS x-rays (skeletal survey) in 6 months RTC 1 week after labs/x-ray in 6 months    I discussed the assessment and treatment plan with the patient. The patient was provided an opportunity to ask questions and all were answered. The patient agreed with the plan and demonstrated an understanding of the instructions.   The patient was advised to call back or seek an in-person evaluation if the symptoms worsen or if the condition fails to improve as anticipated.  I provided 22 minutes of non-face-to-face time during this encounter.   Harriett Rush, PA-C 11/05/21 10:49 AM

## 2021-11-05 NOTE — Patient Instructions (Signed)
[place] [facility] [place] **   You were seen today by Tarri Abernethy PA-C for your MGUS.   Your MGUS labs are stable.  You continue to have an abnormal protein (M spike and elevated kappa light chains), but these have not increased compared to your last labs. You need to have x-rays done of your leg bones to follow-up on possible spots seen on your last x-rays in April. You will be due for repeat labs and whole-body x-rays in 6 months.  Please remember that when you come for your labs in 6 months you will also need to go to Radiology to get your x-rays done.  FOLLOW-UP APPOINTMENT: Office visit in 6 months (1 week after labs and x-rays)

## 2021-11-06 LAB — PROTEIN ELECTROPHORESIS, SERUM
A/G Ratio: 1 (ref 0.7–1.7)
Albumin ELP: 3.2 g/dL (ref 2.9–4.4)
Alpha-1-Globulin: 0.3 g/dL (ref 0.0–0.4)
Alpha-2-Globulin: 0.8 g/dL (ref 0.4–1.0)
Beta Globulin: 1 g/dL (ref 0.7–1.3)
Gamma Globulin: 1 g/dL (ref 0.4–1.8)
Globulin, Total: 3.1 g/dL (ref 2.2–3.9)
M-Spike, %: 0.3 g/dL — ABNORMAL HIGH
Total Protein ELP: 6.3 g/dL (ref 6.0–8.5)

## 2021-11-10 ENCOUNTER — Inpatient Hospital Stay (HOSPITAL_COMMUNITY): Payer: Medicaid Other

## 2021-11-10 ENCOUNTER — Emergency Department (HOSPITAL_COMMUNITY): Payer: Medicaid Other

## 2021-11-10 ENCOUNTER — Other Ambulatory Visit: Payer: Self-pay

## 2021-11-10 ENCOUNTER — Inpatient Hospital Stay (HOSPITAL_COMMUNITY)
Admission: EM | Admit: 2021-11-10 | Discharge: 2021-11-13 | DRG: 199 | Disposition: A | Discharge status: Home / Self Care | Payer: Medicaid Other | Attending: Surgery | Admitting: Surgery

## 2021-11-10 ENCOUNTER — Encounter (HOSPITAL_COMMUNITY): Payer: Self-pay

## 2021-11-10 DIAGNOSIS — T148XXA Other injury of unspecified body region, initial encounter: Principal | ICD-10-CM

## 2021-11-10 DIAGNOSIS — T1490XA Injury, unspecified, initial encounter: Secondary | ICD-10-CM | POA: Diagnosis present

## 2021-11-10 DIAGNOSIS — S21219A Laceration without foreign body of unspecified back wall of thorax without penetration into thoracic cavity, initial encounter: Secondary | ICD-10-CM | POA: Diagnosis present

## 2021-11-10 DIAGNOSIS — I10 Essential (primary) hypertension: Secondary | ICD-10-CM | POA: Diagnosis present

## 2021-11-10 DIAGNOSIS — F329 Major depressive disorder, single episode, unspecified: Secondary | ICD-10-CM | POA: Diagnosis not present

## 2021-11-10 DIAGNOSIS — Z79899 Other long term (current) drug therapy: Secondary | ICD-10-CM

## 2021-11-10 DIAGNOSIS — Z8249 Family history of ischemic heart disease and other diseases of the circulatory system: Secondary | ICD-10-CM

## 2021-11-10 DIAGNOSIS — M549 Dorsalgia, unspecified: Secondary | ICD-10-CM | POA: Diagnosis not present

## 2021-11-10 DIAGNOSIS — S1183XA Puncture wound without foreign body of other specified part of neck, initial encounter: Secondary | ICD-10-CM | POA: Diagnosis not present

## 2021-11-10 DIAGNOSIS — S270XXA Traumatic pneumothorax, initial encounter: Secondary | ICD-10-CM | POA: Diagnosis not present

## 2021-11-10 DIAGNOSIS — F419 Anxiety disorder, unspecified: Secondary | ICD-10-CM | POA: Diagnosis not present

## 2021-11-10 DIAGNOSIS — S27331A Laceration of lung, unilateral, initial encounter: Secondary | ICD-10-CM | POA: Diagnosis present

## 2021-11-10 DIAGNOSIS — L4050 Arthropathic psoriasis, unspecified: Secondary | ICD-10-CM | POA: Diagnosis present

## 2021-11-10 DIAGNOSIS — F17200 Nicotine dependence, unspecified, uncomplicated: Secondary | ICD-10-CM | POA: Diagnosis not present

## 2021-11-10 DIAGNOSIS — Z21 Asymptomatic human immunodeficiency virus [HIV] infection status: Secondary | ICD-10-CM | POA: Diagnosis present

## 2021-11-10 DIAGNOSIS — F430 Acute stress reaction: Secondary | ICD-10-CM | POA: Diagnosis not present

## 2021-11-10 DIAGNOSIS — Z791 Long term (current) use of non-steroidal anti-inflammatories (NSAID): Secondary | ICD-10-CM

## 2021-11-10 DIAGNOSIS — Z825 Family history of asthma and other chronic lower respiratory diseases: Secondary | ICD-10-CM | POA: Diagnosis not present

## 2021-11-10 DIAGNOSIS — Z7985 Long-term (current) use of injectable non-insulin antidiabetic drugs: Secondary | ICD-10-CM

## 2021-11-10 DIAGNOSIS — S1193XA Puncture wound without foreign body of unspecified part of neck, initial encounter: Secondary | ICD-10-CM | POA: Diagnosis not present

## 2021-11-10 DIAGNOSIS — Z7951 Long term (current) use of inhaled steroids: Secondary | ICD-10-CM

## 2021-11-10 DIAGNOSIS — Y92512 Supermarket, store or market as the place of occurrence of the external cause: Secondary | ICD-10-CM | POA: Diagnosis not present

## 2021-11-10 DIAGNOSIS — Y990 Civilian activity done for income or pay: Secondary | ICD-10-CM

## 2021-11-10 DIAGNOSIS — Z23 Encounter for immunization: Secondary | ICD-10-CM | POA: Diagnosis not present

## 2021-11-10 DIAGNOSIS — Z881 Allergy status to other antibiotic agents status: Secondary | ICD-10-CM

## 2021-11-10 DIAGNOSIS — T50Z15A Adverse effect of immunoglobulin, initial encounter: Secondary | ICD-10-CM | POA: Diagnosis not present

## 2021-11-10 DIAGNOSIS — D472 Monoclonal gammopathy: Secondary | ICD-10-CM | POA: Diagnosis present

## 2021-11-10 DIAGNOSIS — I952 Hypotension due to drugs: Secondary | ICD-10-CM | POA: Diagnosis not present

## 2021-11-10 DIAGNOSIS — M79602 Pain in left arm: Secondary | ICD-10-CM | POA: Diagnosis not present

## 2021-11-10 DIAGNOSIS — R0902 Hypoxemia: Secondary | ICD-10-CM | POA: Diagnosis not present

## 2021-11-10 DIAGNOSIS — S31030A Puncture wound without foreign body of lower back and pelvis without penetration into retroperitoneum, initial encounter: Secondary | ICD-10-CM | POA: Diagnosis not present

## 2021-11-10 DIAGNOSIS — J189 Pneumonia, unspecified organism: Secondary | ICD-10-CM | POA: Diagnosis not present

## 2021-11-10 DIAGNOSIS — T8040XA Rh incompatibility reaction due to transfusion of blood or blood products, unspecified, initial encounter: Secondary | ICD-10-CM | POA: Diagnosis not present

## 2021-11-10 DIAGNOSIS — K219 Gastro-esophageal reflux disease without esophagitis: Secondary | ICD-10-CM | POA: Diagnosis not present

## 2021-11-10 DIAGNOSIS — J9811 Atelectasis: Secondary | ICD-10-CM | POA: Diagnosis not present

## 2021-11-10 DIAGNOSIS — Z833 Family history of diabetes mellitus: Secondary | ICD-10-CM

## 2021-11-10 DIAGNOSIS — S1190XA Unspecified open wound of unspecified part of neck, initial encounter: Secondary | ICD-10-CM | POA: Diagnosis not present

## 2021-11-10 DIAGNOSIS — S2232XA Fracture of one rib, left side, initial encounter for closed fracture: Secondary | ICD-10-CM | POA: Diagnosis present

## 2021-11-10 DIAGNOSIS — E119 Type 2 diabetes mellitus without complications: Secondary | ICD-10-CM | POA: Diagnosis present

## 2021-11-10 DIAGNOSIS — S21332A Puncture wound without foreign body of left front wall of thorax with penetration into thoracic cavity, initial encounter: Secondary | ICD-10-CM | POA: Diagnosis not present

## 2021-11-10 DIAGNOSIS — S272XXA Traumatic hemopneumothorax, initial encounter: Secondary | ICD-10-CM | POA: Diagnosis not present

## 2021-11-10 DIAGNOSIS — R079 Chest pain, unspecified: Secondary | ICD-10-CM | POA: Diagnosis not present

## 2021-11-10 DIAGNOSIS — Z79620 Long term (current) use of immunosuppressive biologic: Secondary | ICD-10-CM

## 2021-11-10 DIAGNOSIS — R7989 Other specified abnormal findings of blood chemistry: Secondary | ICD-10-CM | POA: Diagnosis not present

## 2021-11-10 DIAGNOSIS — J939 Pneumothorax, unspecified: Secondary | ICD-10-CM | POA: Diagnosis not present

## 2021-11-10 DIAGNOSIS — R52 Pain, unspecified: Secondary | ICD-10-CM | POA: Diagnosis not present

## 2021-11-10 LAB — CBC
HCT: 40.4 % (ref 36.0–46.0)
HCT: 41 % (ref 36.0–46.0)
Hemoglobin: 13.2 g/dL (ref 12.0–15.0)
Hemoglobin: 13.2 g/dL (ref 12.0–15.0)
MCH: 33.5 pg (ref 26.0–34.0)
MCH: 32.5 pg (ref 26.0–34.0)
MCHC: 32.7 g/dL (ref 30.0–36.0)
MCHC: 32.2 g/dL (ref 30.0–36.0)
MCV: 102.5 fL — ABNORMAL HIGH (ref 80.0–100.0)
MCV: 101 fL — ABNORMAL HIGH (ref 80.0–100.0)
Platelets: 433 10*3/uL — ABNORMAL HIGH (ref 150–400)
Platelets: 445 10*3/uL — ABNORMAL HIGH (ref 150–400)
RBC: 3.94 MIL/uL (ref 3.87–5.11)
RBC: 4.06 MIL/uL (ref 3.87–5.11)
RDW: 13.2 % (ref 11.5–15.5)
RDW: 14.6 % (ref 11.5–15.5)
WBC: 16 10*3/uL — ABNORMAL HIGH (ref 4.0–10.5)
WBC: 23.2 10*3/uL — ABNORMAL HIGH (ref 4.0–10.5)
nRBC: 0 % (ref 0.0–0.2)
nRBC: 0 % (ref 0.0–0.2)

## 2021-11-10 LAB — PREPARE RBC (CROSSMATCH)

## 2021-11-10 LAB — I-STAT CHEM 8, ED
BUN: 10 mg/dL (ref 6–20)
Calcium, Ion: 1.02 mmol/L — ABNORMAL LOW (ref 1.15–1.40)
Chloride: 104 mmol/L (ref 98–111)
Creatinine, Ser: 0.9 mg/dL (ref 0.44–1.00)
Glucose, Bld: 195 mg/dL — ABNORMAL HIGH (ref 70–99)
HCT: 42 % (ref 36.0–46.0)
Hemoglobin: 14.3 g/dL (ref 12.0–15.0)
Potassium: 3.7 mmol/L (ref 3.5–5.1)
Sodium: 139 mmol/L (ref 135–145)
TCO2: 23 mmol/L (ref 22–32)

## 2021-11-10 LAB — MRSA NEXT GEN BY PCR, NASAL: MRSA by PCR Next Gen: NOT DETECTED

## 2021-11-10 LAB — PROTIME-INR
INR: 1 (ref 0.8–1.2)
Prothrombin Time: 13.3 seconds (ref 11.4–15.2)

## 2021-11-10 LAB — COMPREHENSIVE METABOLIC PANEL
ALT: 11 U/L (ref 0–44)
AST: 15 U/L (ref 15–41)
Albumin: 3.6 g/dL (ref 3.5–5.0)
Alkaline Phosphatase: 62 U/L (ref 38–126)
Anion gap: 10 (ref 5–15)
BUN: 10 mg/dL (ref 6–20)
CO2: 22 mmol/L (ref 22–32)
Calcium: 8.7 mg/dL — ABNORMAL LOW (ref 8.9–10.3)
Chloride: 106 mmol/L (ref 98–111)
Creatinine, Ser: 0.95 mg/dL (ref 0.44–1.00)
GFR, Estimated: >60 mL/min (ref >60)
Glucose, Bld: 196 mg/dL — ABNORMAL HIGH (ref 70–99)
Potassium: 3.7 mmol/L (ref 3.5–5.1)
Sodium: 138 mmol/L (ref 135–145)
Total Bilirubin: 0.5 mg/dL (ref 0.3–1.2)
Total Protein: 6.7 g/dL (ref 6.5–8.1)

## 2021-11-10 LAB — LACTIC ACID, PLASMA: Lactic Acid, Venous: 1.6 mmol/L (ref 0.5–1.9)

## 2021-11-10 LAB — ETHANOL: Alcohol, Ethyl (B): <10 mg/dL (ref <10)

## 2021-11-10 LAB — ABO/RH: ABO/RH(D): A NEG

## 2021-11-10 MED ORDER — DOCUSATE SODIUM 100 MG PO CAPS
100.0000 mg | ORAL_CAPSULE | Freq: Two times a day (BID) | ORAL | Status: DC
  Administered 2021-11-10 – 2021-11-13 (×6): 100 mg via ORAL
  Filled 2021-11-10 (×6): qty 1

## 2021-11-10 MED ORDER — ENOXAPARIN SODIUM 30 MG/0.3ML IJ SOSY: 30.0000 mg | PREFILLED_SYRINGE | Freq: Two times a day (BID) | INTRAMUSCULAR | Status: DC

## 2021-11-10 MED ORDER — PROCHLORPERAZINE EDISYLATE 10 MG/2ML IJ SOLN: 5.0000 mg | Freq: Four times a day (QID) | INTRAMUSCULAR | Status: DC | PRN

## 2021-11-10 MED ORDER — ONDANSETRON HCL 4 MG/2ML IJ SOLN
INTRAMUSCULAR | Status: AC
  Administered 2021-11-10: 2 mg
  Filled 2021-11-10: qty 2

## 2021-11-10 MED ORDER — METHOCARBAMOL 500 MG PO TABS
500.0000 mg | ORAL_TABLET | Freq: Three times a day (TID) | ORAL | Status: DC | PRN
  Administered 2021-11-10: 500 mg via ORAL
  Filled 2021-11-10: qty 1

## 2021-11-10 MED ORDER — SODIUM CHLORIDE 0.9 % IV SOLN: INTRAVENOUS | Status: DC

## 2021-11-10 MED ORDER — ONDANSETRON HCL 4 MG/2ML IJ SOLN: 4.0000 mg | Freq: Four times a day (QID) | INTRAMUSCULAR | Status: DC | PRN

## 2021-11-10 MED ORDER — FENTANYL CITRATE PF 50 MCG/ML IJ SOSY: 50.0000 ug | PREFILLED_SYRINGE | Freq: Once | INTRAMUSCULAR | Status: DC

## 2021-11-10 MED ORDER — CHLORHEXIDINE GLUCONATE CLOTH 2 % EX PADS
6.0000 | MEDICATED_PAD | Freq: Every day | CUTANEOUS | Status: DC
  Administered 2021-11-10 – 2021-11-13 (×4): 6 via TOPICAL

## 2021-11-10 MED ORDER — SODIUM CHLORIDE 0.9% IV SOLUTION: Freq: Once | INTRAVENOUS | Status: DC

## 2021-11-10 MED ORDER — LIDOCAINE-EPINEPHRINE-TETRACAINE (LET) TOPICAL GEL
9.0000 mL | Freq: Once | TOPICAL | Status: AC
  Administered 2021-11-10: 9 mL via TOPICAL

## 2021-11-10 MED ORDER — MORPHINE SULFATE (PF) 2 MG/ML IV SOLN
1.0000 mg | INTRAVENOUS | Status: DC | PRN
  Administered 2021-11-10 – 2021-11-11 (×4): 2 mg via INTRAVENOUS
  Filled 2021-11-10 (×4): qty 1

## 2021-11-10 MED ORDER — IOHEXOL 350 MG/ML SOLN
100.0000 mL | Freq: Once | INTRAVENOUS | Status: AC | PRN
  Administered 2021-11-10: 100 mL via INTRAVENOUS

## 2021-11-10 MED ORDER — PANTOPRAZOLE SODIUM 40 MG IV SOLR: 40.0000 mg | Freq: Every day | INTRAVENOUS | Status: DC

## 2021-11-10 MED ORDER — CEFAZOLIN SODIUM-DEXTROSE 2-4 GM/100ML-% IV SOLN
2.0000 g | Freq: Once | INTRAVENOUS | Status: AC
  Administered 2021-11-10: 2 g via INTRAVENOUS

## 2021-11-10 MED ORDER — ONDANSETRON HCL 4 MG/2ML IJ SOLN: 4.0000 mg | Freq: Once | INTRAMUSCULAR | Status: DC

## 2021-11-10 MED ORDER — HYDROMORPHONE HCL 1 MG/ML IJ SOLN: 0.5000 mg | Freq: Once | INTRAMUSCULAR | Status: DC

## 2021-11-10 MED ORDER — ORAL CARE MOUTH RINSE: 15.0000 mL | OROMUCOSAL | Status: DC | PRN

## 2021-11-10 MED ORDER — METHOCARBAMOL 1000 MG/10ML IJ SOLN: 500.0000 mg | Freq: Three times a day (TID) | INTRAVENOUS | Status: DC | PRN

## 2021-11-10 MED ORDER — SODIUM CHLORIDE 0.9 % IV SOLN
INTRAVENOUS | Status: AC | PRN
  Administered 2021-11-10: 1000 mL via INTRAVENOUS

## 2021-11-10 MED ORDER — OXYCODONE HCL 5 MG PO TABS: 5.0000 mg | ORAL_TABLET | ORAL | Status: DC | PRN

## 2021-11-10 MED ORDER — MELATONIN 3 MG PO TABS: 3.0000 mg | ORAL_TABLET | Freq: Every evening | ORAL | Status: DC | PRN

## 2021-11-10 MED ORDER — FENTANYL CITRATE PF 50 MCG/ML IJ SOSY
50.0000 ug | PREFILLED_SYRINGE | INTRAMUSCULAR | Status: DC | PRN
  Administered 2021-11-10: 50 ug via INTRAVENOUS
  Filled 2021-11-10: qty 1

## 2021-11-10 MED ORDER — FENTANYL CITRATE PF 50 MCG/ML IJ SOSY
PREFILLED_SYRINGE | INTRAMUSCULAR | Status: AC
  Administered 2021-11-10: 50 ug
  Filled 2021-11-10: qty 1

## 2021-11-10 MED ORDER — TETANUS-DIPHTH-ACELL PERTUSSIS 5-2.5-18.5 LF-MCG/0.5 IM SUSY
0.5000 mL | PREFILLED_SYRINGE | Freq: Once | INTRAMUSCULAR | Status: AC
  Administered 2021-11-10: 0.5 mL via INTRAMUSCULAR

## 2021-11-10 MED ORDER — ONDANSETRON 4 MG PO TBDP: 4.0000 mg | ORAL_TABLET | Freq: Four times a day (QID) | ORAL | Status: DC | PRN

## 2021-11-10 MED ORDER — ACETAMINOPHEN 325 MG PO TABS: 650.0000 mg | ORAL_TABLET | ORAL | Status: DC | PRN

## 2021-11-10 MED ORDER — SODIUM CHLORIDE 0.9 % IV BOLUS
500.0000 mL | Freq: Once | INTRAVENOUS | Status: AC
  Administered 2021-11-10: 500 mL via INTRAVENOUS

## 2021-11-10 MED ORDER — PROCHLORPERAZINE MALEATE 10 MG PO TABS: 10.0000 mg | ORAL_TABLET | Freq: Four times a day (QID) | ORAL | Status: DC | PRN

## 2021-11-10 MED ORDER — PANTOPRAZOLE SODIUM 40 MG PO TBEC
40.0000 mg | DELAYED_RELEASE_TABLET | Freq: Every day | ORAL | Status: DC
  Administered 2021-11-10 – 2021-11-13 (×4): 40 mg via ORAL
  Filled 2021-11-10 (×4): qty 1
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
## 2021-11-10 NOTE — ED Notes (Signed)
1 additional unit of PRBC, Repeat chest x-ray, and repeat CBC per Dr. Barry Dienes
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
## 2021-11-10 NOTE — ED Notes (Signed)
Patient transported to CT 
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
## 2021-11-11 ENCOUNTER — Inpatient Hospital Stay (HOSPITAL_COMMUNITY): Payer: Medicaid Other

## 2021-11-11 DIAGNOSIS — S270XXA Traumatic pneumothorax, initial encounter: Secondary | ICD-10-CM | POA: Diagnosis not present

## 2021-11-11 DIAGNOSIS — S21332A Puncture wound without foreign body of left front wall of thorax with penetration into thoracic cavity, initial encounter: Secondary | ICD-10-CM | POA: Diagnosis not present

## 2021-11-11 DIAGNOSIS — S1183XA Puncture wound without foreign body of other specified part of neck, initial encounter: Secondary | ICD-10-CM | POA: Diagnosis not present

## 2021-11-11 DIAGNOSIS — J939 Pneumothorax, unspecified: Secondary | ICD-10-CM | POA: Diagnosis not present

## 2021-11-11 DIAGNOSIS — S2232XA Fracture of one rib, left side, initial encounter for closed fracture: Secondary | ICD-10-CM | POA: Diagnosis not present

## 2021-11-11 LAB — CBC
HCT: 37.7 % (ref 36.0–46.0)
Hemoglobin: 12.4 g/dL (ref 12.0–15.0)
MCH: 31.7 pg (ref 26.0–34.0)
MCHC: 32.9 g/dL (ref 30.0–36.0)
MCV: 96.4 fL (ref 80.0–100.0)
Platelets: 312 10*3/uL (ref 150–400)
RBC: 3.91 MIL/uL (ref 3.87–5.11)
RDW: 16.5 % — ABNORMAL HIGH (ref 11.5–15.5)
WBC: 17.2 10*3/uL — ABNORMAL HIGH (ref 4.0–10.5)
nRBC: 0 % (ref 0.0–0.2)

## 2021-11-11 LAB — BASIC METABOLIC PANEL
Anion gap: 10 (ref 5–15)
BUN: 10 mg/dL (ref 6–20)
CO2: 24 mmol/L (ref 22–32)
Calcium: 8.1 mg/dL — ABNORMAL LOW (ref 8.9–10.3)
Chloride: 103 mmol/L (ref 98–111)
Creatinine, Ser: 0.86 mg/dL (ref 0.44–1.00)
GFR, Estimated: >60 mL/min (ref >60)
Glucose, Bld: 150 mg/dL — ABNORMAL HIGH (ref 70–99)
Potassium: 3.9 mmol/L (ref 3.5–5.1)
Sodium: 137 mmol/L (ref 135–145)

## 2021-11-11 MED ORDER — ALBUTEROL SULFATE HFA 108 (90 BASE) MCG/ACT IN AERS: 1.0000 | INHALATION_SPRAY | Freq: Four times a day (QID) | RESPIRATORY_TRACT | Status: DC | PRN

## 2021-11-11 MED ORDER — RHO D IMMUNE GLOBULIN 15000 UNIT/13ML IJ SOLN: 15000.0000 ug | Freq: Three times a day (TID) | INTRAMUSCULAR | Status: DC

## 2021-11-11 MED ORDER — LISINOPRIL 20 MG PO TABS
20.0000 mg | ORAL_TABLET | Freq: Every day | ORAL | Status: DC
  Administered 2021-11-11 – 2021-11-13 (×3): 20 mg via ORAL
  Filled 2021-11-11 (×3): qty 1

## 2021-11-11 MED ORDER — LIDOCAINE 5 % EX PTCH
1.0000 | MEDICATED_PATCH | CUTANEOUS | Status: DC
  Administered 2021-11-11 – 2021-11-12 (×2): 1 via TRANSDERMAL
  Filled 2021-11-11 (×2): qty 1

## 2021-11-11 MED ORDER — ALBUTEROL SULFATE (2.5 MG/3ML) 0.083% IN NEBU
2.5000 mg | INHALATION_SOLUTION | Freq: Four times a day (QID) | RESPIRATORY_TRACT | Status: DC | PRN
  Administered 2021-11-12: 2.5 mg via RESPIRATORY_TRACT
  Filled 2021-11-11: qty 3

## 2021-11-11 MED ORDER — DOLUTEGRAVIR-LAMIVUDINE 50-300 MG PO TABS
1.0000 | ORAL_TABLET | Freq: Every day | ORAL | Status: DC
  Administered 2021-11-11 – 2021-11-13 (×3): 1 via ORAL
  Filled 2021-11-11 (×3): qty 1

## 2021-11-11 MED ORDER — FUROSEMIDE 20 MG PO TABS
20.0000 mg | ORAL_TABLET | Freq: Every day | ORAL | Status: DC
  Administered 2021-11-11 – 2021-11-13 (×3): 20 mg via ORAL
  Filled 2021-11-11 (×3): qty 1

## 2021-11-11 MED ORDER — ACETAMINOPHEN 500 MG PO TABS
1000.0000 mg | ORAL_TABLET | Freq: Four times a day (QID) | ORAL | Status: DC
  Administered 2021-11-11 – 2021-11-13 (×8): 1000 mg via ORAL
  Filled 2021-11-11 (×10): qty 2

## 2021-11-11 MED ORDER — GABAPENTIN 600 MG PO TABS
600.0000 mg | ORAL_TABLET | Freq: Three times a day (TID) | ORAL | Status: DC
  Administered 2021-11-11 – 2021-11-13 (×6): 600 mg via ORAL
  Filled 2021-11-11 (×7): qty 1

## 2021-11-11 MED ORDER — METHOCARBAMOL 500 MG PO TABS
1000.0000 mg | ORAL_TABLET | Freq: Three times a day (TID) | ORAL | Status: DC
  Administered 2021-11-11 – 2021-11-13 (×6): 1000 mg via ORAL
  Filled 2021-11-11 (×6): qty 2

## 2021-11-11 MED ORDER — AMLODIPINE BESYLATE 10 MG PO TABS
10.0000 mg | ORAL_TABLET | Freq: Every day | ORAL | Status: DC
  Administered 2021-11-11 – 2021-11-13 (×3): 10 mg via ORAL
  Filled 2021-11-11 (×3): qty 1

## 2021-11-11 MED ORDER — GUAIFENESIN 100 MG/5ML PO LIQD
10.0000 mL | ORAL | Status: DC
  Administered 2021-11-11 – 2021-11-13 (×10): 10 mL via ORAL
  Filled 2021-11-11 (×10): qty 15

## 2021-11-11 MED ORDER — ENOXAPARIN SODIUM 40 MG/0.4ML IJ SOSY
40.0000 mg | PREFILLED_SYRINGE | Freq: Two times a day (BID) | INTRAMUSCULAR | Status: DC
  Administered 2021-11-12 – 2021-11-13 (×3): 40 mg via SUBCUTANEOUS
  Filled 2021-11-11 (×3): qty 0.4

## 2021-11-11 MED ORDER — MORPHINE SULFATE (PF) 2 MG/ML IV SOLN
2.0000 mg | Freq: Four times a day (QID) | INTRAVENOUS | Status: DC | PRN
  Administered 2021-11-11: 2 mg via INTRAVENOUS
  Filled 2021-11-11: qty 1

## 2021-11-11 MED ORDER — IBUPROFEN 200 MG PO TABS
600.0000 mg | ORAL_TABLET | Freq: Four times a day (QID) | ORAL | Status: DC
  Administered 2021-11-11 – 2021-11-13 (×9): 600 mg via ORAL
  Filled 2021-11-11 (×2): qty 3
  Filled 2021-11-11: qty 1
  Filled 2021-11-11 (×4): qty 3
  Filled 2021-11-11: qty 1
  Filled 2021-11-11 (×2): qty 3
  Filled 2021-11-11: qty 1

## 2021-11-11 MED ORDER — OXYCODONE HCL 5 MG PO TABS
5.0000 mg | ORAL_TABLET | ORAL | Status: DC | PRN
  Administered 2021-11-11 – 2021-11-12 (×3): 5 mg via ORAL
  Filled 2021-11-11 (×4): qty 1
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
## 2021-11-11 NOTE — Progress Notes (Signed)
OT Cancellation Note  Patient Details Name: Seraphina Mitchner MRN: [id number] DOB: 07-04-1975   Cancelled Treatment:    Reason Eval/Treat Not Completed: Other (comment) (OT imminent orders received, RN requests therapy check back later today.)  Elliot Cousin 11/11/2021, 11:14 AM
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
## 2021-11-11 NOTE — Progress Notes (Addendum)
Discussed transfusion incompatibility with the patient. Rec'd 2u O+ blood, type A-. No h/o BTL/hysterectomy. Recommend [facility] admin and explained indications to patient. She is agreeable to admin. Dose calculated at 45000IU, will plan to give 15000IU q8h.   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 D/w RPh to DISCREETLY obtain med for patient for [facility] review and admin as patient has numerous family at bedside.   Jesusita Oka, MD General and [facility]

## 2021-11-11 NOTE — Progress Notes (Signed)
PT Cancellation Note  Patient Details Name: Jasmine Peck MRN: [id number] DOB: 10/18/1975   Cancelled Treatment:    Reason Eval/Treat Not Completed: Other (comment)  PT orders received for imminent d/c.  Spoke with RN and pt just up to [facility] and settled back in bed to rest due to pain. Requested PT f/u later today.  Will f/u as able with plans to premedicate for pain.  Abran Richard, PT Acute Rehab [facility] [phone]  Karlton Lemon 11/11/2021, 11:00 AM
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
## 2021-11-12 ENCOUNTER — Encounter (HOSPITAL_COMMUNITY): Payer: Self-pay

## 2021-11-12 ENCOUNTER — Inpatient Hospital Stay (HOSPITAL_COMMUNITY): Payer: Medicaid Other

## 2021-11-12 DIAGNOSIS — S270XXA Traumatic pneumothorax, initial encounter: Secondary | ICD-10-CM | POA: Diagnosis not present

## 2021-11-12 DIAGNOSIS — S21332A Puncture wound without foreign body of left front wall of thorax with penetration into thoracic cavity, initial encounter: Secondary | ICD-10-CM | POA: Diagnosis not present

## 2021-11-12 DIAGNOSIS — R0902 Hypoxemia: Secondary | ICD-10-CM | POA: Diagnosis not present

## 2021-11-12 DIAGNOSIS — T1490XA Injury, unspecified, initial encounter: Secondary | ICD-10-CM | POA: Diagnosis not present

## 2021-11-12 DIAGNOSIS — S2232XA Fracture of one rib, left side, initial encounter for closed fracture: Secondary | ICD-10-CM | POA: Diagnosis not present

## 2021-11-12 DIAGNOSIS — S1183XA Puncture wound without foreign body of other specified part of neck, initial encounter: Secondary | ICD-10-CM | POA: Diagnosis not present

## 2021-11-12 DIAGNOSIS — J9811 Atelectasis: Secondary | ICD-10-CM | POA: Diagnosis not present

## 2021-11-12 LAB — EXPECTORATED SPUTUM ASSESSMENT W GRAM STAIN, RFLX TO RESP C

## 2021-11-12 LAB — BLOOD PRODUCT ORDER (VERBAL) VERIFICATION

## 2021-11-12 MED ORDER — PRAZOSIN HCL 1 MG PO CAPS: 1.0000 mg | ORAL_CAPSULE | Freq: Every day | ORAL | 0 refills | Status: DC

## 2021-11-12 MED ORDER — ACETAMINOPHEN 500 MG PO TABS: 1000.0000 mg | ORAL_TABLET | Freq: Four times a day (QID) | ORAL | 0 refills | Status: DC | PRN

## 2021-11-12 MED ORDER — DIPHENHYDRAMINE HCL 50 MG/ML IJ SOLN
25.0000 mg | Freq: Once | INTRAMUSCULAR | Status: AC
  Administered 2021-11-12: 25 mg via INTRAVENOUS
  Filled 2021-11-12: qty 1

## 2021-11-12 MED ORDER — RHO D IMMUNE GLOBULIN 1500 UNIT/2ML IJ SOSY
9000.0000 ug | PREFILLED_SYRINGE | Freq: Once | INTRAMUSCULAR | Status: AC
  Administered 2021-11-12: 9000 ug via INTRAVENOUS
  Filled 2021-11-12: qty 60

## 2021-11-12 MED ORDER — GUAIFENESIN 100 MG/5ML PO LIQD: 10.0000 mL | ORAL | 0 refills | Status: AC

## 2021-11-12 MED ORDER — IBUPROFEN 600 MG PO TABS: 600.0000 mg | ORAL_TABLET | Freq: Three times a day (TID) | ORAL | 0 refills | Status: AC | PRN

## 2021-11-12 MED ORDER — PRAZOSIN HCL 1 MG PO CAPS
1.0000 mg | ORAL_CAPSULE | Freq: Every day | ORAL | Status: DC
  Administered 2021-11-12: 1 mg via ORAL
  Filled 2021-11-12 (×2): qty 1

## 2021-11-12 MED ORDER — OXYCODONE HCL 5 MG PO TABS: 5.0000 mg | ORAL_TABLET | Freq: Four times a day (QID) | ORAL | 0 refills | Status: DC | PRN

## 2021-11-12 MED ORDER — METHOCARBAMOL 1000 MG PO TABS: 1000.0000 mg | ORAL_TABLET | Freq: Three times a day (TID) | ORAL | 0 refills | Status: AC | PRN

## 2021-11-12 NOTE — Discharge Instructions (Addendum)
You were put on a medication called prazosin for symptoms of PTSD this hospital stay. This should help most with nightmares, feeling on-edge, and flashbacks. The main risk of this medication is lightheadedness in the AM - we started you on 1 mg to help reduce this risk but many people require 4-5 mg for full effect. You will need to follow up with a psychiatrist or other mental health practitioner to safely increase your dose and for refills. The other medication we talked about was for venlafaxine (should help more with mood and anxiety symptoms) but we did not start this medication. As always, if you feel you cannot keep yourself safe or experience suicidal thoughts call 911, [phone] or go to the nearest ED.   You should be able to call one of the following facilities for follow up psychiatric care; if neither of these have available openings you can call the # on the back of your insurance card . You would also benefit from a special type of therapy called CPT which helps people with trauma.   Please present to one of the following facilities to start medication management and therapy services:   [facility] [address]  [place], [place] [address] [address],  [place], [place] [address] [phone]
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
## 2021-11-12 NOTE — Progress Notes (Signed)
Pt receiving rhogam dose per pharm... pharmacist called to prior to administration to discuss administration.Marland Kitchen administering dose over 60 seconds per vial.. 60 vials administered per dose ordered... no reactions noted.. pt aware to notify nurse of any adverse reactions.. will monitor for changes.. provider updated of dose given
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
## 2021-11-12 NOTE — Plan of Care (Signed)

## 2021-11-12 NOTE — Progress Notes (Signed)
Pharmacy note: Rhophylac dose   Patient to receive Rhophylac for transfusion incompatibility. She was given 2 units of blood which was a total of 630 ml or PRBC  Dosage discussed with Dr. Saralyn Pilar (blood bank medical director) and Dr. Bobbye Morton.   -the recommended dose was 30 syringes (323mcg each for a total of 9000 mcg)  -the supply is already on 2W in the fridge  Plan -9076mcg Rhophylac to be given tonight    Hildred Laser, PharmD Clinical Pharmacist **Pharmacist phone directory can now be found on [email or web address] ([email or web address]).  Listed under [place].

## 2021-11-12 NOTE — Progress Notes (Signed)
.  Trauma Event Note    Reason for Call : Pt with chills and tachycardiac (HR 120s-130) approx 30 min post Rhogam admin.     Initial Focused Assessment: Pt resting with frequent intermittent episodes of chills. No other complaints. NAD.     Interventions: Full set  of VS obtained at 2315 by TRN. T 99.1, HR 121, RR 18, BP 134/90, SaO2 93% RA. Dr. Zenia Resides notified by Primary RN    Plan of Care: Benadryl ordered. To be given by primary RN.      Last imported Vital Signs BP (!) 148/98 (BP Location: Right Arm)   Pulse (!) 116   Temp 97.6 F (36.4 C) (Oral)   Resp 20   Ht 5\' 8"  (1.727 m)   Wt (!) 140.6 kg   SpO2 93%   BMI 47.14 kg/m   Trending CBC Recent Labs    11/10/21 1446 11/10/21 1507 11/10/21 1759 11/11/21 0452  WBC 16.0*  --  23.2* 17.2*  HGB 13.2 14.3 13.2 12.4  HCT 40.4 42.0 41.0 37.7  PLT 433*  --  445* 312    Trending Coag's Recent Labs    11/10/21 1446  INR 1.0    Trending BMET Recent Labs    11/10/21 1446 11/10/21 1507 11/11/21 0452  NA 138 139 137  K 3.7 3.7 3.9  CL 106 104 103  CO2 22  --  24  BUN 10 10 10   CREATININE 0.95 0.90 0.86  GLUCOSE 196* 195* 150*      Bonnye Halle C Mazy [place]  Trauma Response RN  Please call TRN at [phone] for further assistance.

## 2021-11-12 NOTE — Progress Notes (Signed)
Pt c/o shivering around 2310. Nurse notified and v/s taken and dr.allen called.  Pt HR elevated 123 and temp 98.7.  benadryl ordered and will cont to monitor.
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
## 2021-11-13 DIAGNOSIS — T1490XA Injury, unspecified, initial encounter: Secondary | ICD-10-CM | POA: Diagnosis not present

## 2021-11-13 LAB — RHOGAM INJECTION
Unit division: 0
Unit division: 0
Unit division: 0
Unit division: 0
Unit division: 0
Unit division: 0
Unit division: 0
Unit division: 0
Unit division: 0
Unit division: 0
Unit division: 0
Unit division: 0
Unit division: 0
Unit division: 0
Unit division: 0
Unit division: 0
Unit division: 0
Unit division: 0
Unit division: 0
Unit division: 0
Unit division: 0
Unit division: 0
Unit division: 0
Unit division: 0
Unit division: 0
Unit division: 0
Unit division: 0
Unit division: 0
Unit division: 0
Unit division: 0

## 2021-11-13 LAB — COMPREHENSIVE METABOLIC PANEL
ALT: 9 U/L (ref 0–44)
AST: 45 U/L — ABNORMAL HIGH (ref 15–41)
Albumin: 2.7 g/dL — ABNORMAL LOW (ref 3.5–5.0)
Alkaline Phosphatase: 64 U/L (ref 38–126)
Anion gap: 9 (ref 5–15)
BUN: 23 mg/dL — ABNORMAL HIGH (ref 6–20)
CO2: 23 mmol/L (ref 22–32)
Calcium: 8.2 mg/dL — ABNORMAL LOW (ref 8.9–10.3)
Chloride: 105 mmol/L (ref 98–111)
Creatinine, Ser: 1.36 mg/dL — ABNORMAL HIGH (ref 0.44–1.00)
GFR, Estimated: 49 mL/min — ABNORMAL LOW (ref >60)
Glucose, Bld: 120 mg/dL — ABNORMAL HIGH (ref 70–99)
Potassium: 3.6 mmol/L (ref 3.5–5.1)
Sodium: 137 mmol/L (ref 135–145)
Total Bilirubin: 2.9 mg/dL — ABNORMAL HIGH (ref 0.3–1.2)
Total Protein: 5.7 g/dL — ABNORMAL LOW (ref 6.5–8.1)

## 2021-11-13 LAB — CBC
HCT: 34.2 % — ABNORMAL LOW (ref 36.0–46.0)
Hemoglobin: 11.6 g/dL — ABNORMAL LOW (ref 12.0–15.0)
MCH: 33.6 pg (ref 26.0–34.0)
MCHC: 33.9 g/dL (ref 30.0–36.0)
MCV: 99.1 fL (ref 80.0–100.0)
Platelets: 249 10*3/uL (ref 150–400)
RBC: 3.45 MIL/uL — ABNORMAL LOW (ref 3.87–5.11)
RDW: 14.2 % (ref 11.5–15.5)
WBC: 20.2 10*3/uL — ABNORMAL HIGH (ref 4.0–10.5)
nRBC: 0 % (ref 0.0–0.2)

## 2021-11-13 MED ORDER — AMOXICILLIN-POT CLAVULANATE 875-125 MG PO TABS
1.0000 | ORAL_TABLET | Freq: Two times a day (BID) | ORAL | Status: DC
  Administered 2021-11-13: 1 via ORAL
  Filled 2021-11-13 (×2): qty 1

## 2021-11-13 MED ORDER — AMOXICILLIN-POT CLAVULANATE 875-125 MG PO TABS: 1.0000 | ORAL_TABLET | Freq: Two times a day (BID) | ORAL | 0 refills | Status: DC
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
## 2021-11-13 NOTE — Plan of Care (Signed)
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
## 2021-11-13 NOTE — Plan of Care (Signed)
  Problem: Education: Goal: Knowledge of General Education information will improve Description: Including pain rating scale, medication(s)/side effects and non-pharmacologic comfort measures 11/13/2021 1309 by Flossie Dibble, RN Outcome: Completed/Met 11/13/2021 0750 by Flossie Dibble, RN Outcome: Progressing   Problem: Health Behavior/Discharge Planning: Goal: Ability to manage health-related needs will improve 11/13/2021 1309 by Flossie Dibble, RN Outcome: Completed/Met 11/13/2021 0750 by Flossie Dibble, RN Outcome: Progressing   Problem: Clinical Measurements: Goal: Ability to maintain clinical measurements within normal limits will improve 11/13/2021 1309 by Flossie Dibble, RN Outcome: Completed/Met 11/13/2021 0750 by Flossie Dibble, RN Outcome: Progressing Goal: Will remain free from infection 11/13/2021 1309 by Flossie Dibble, RN Outcome: Completed/Met 11/13/2021 0750 by Flossie Dibble, RN Outcome: Progressing Goal: Diagnostic test results will improve 11/13/2021 1309 by Flossie Dibble, RN Outcome: Completed/Met 11/13/2021 0750 by Flossie Dibble, RN Outcome: Progressing Goal: Respiratory complications will improve 11/13/2021 1309 by Flossie Dibble, RN Outcome: Completed/Met 11/13/2021 0750 by Flossie Dibble, RN Outcome: Progressing Goal: Cardiovascular complication will be avoided 11/13/2021 1309 by Flossie Dibble, RN Outcome: Completed/Met 11/13/2021 0750 by Flossie Dibble, RN Outcome: Progressing   Problem: Activity: Goal: Risk for activity intolerance will decrease 11/13/2021 1309 by Flossie Dibble, RN Outcome: Completed/Met 11/13/2021 0750 by Flossie Dibble, RN Outcome: Progressing   Problem: Nutrition: Goal: Adequate nutrition will be maintained 11/13/2021 1309 by Flossie Dibble, RN Outcome: Completed/Met 11/13/2021 0750 by Flossie Dibble, RN Outcome: Progressing   Problem: Coping: Goal: Level of anxiety will decrease 11/13/2021 1309 by Flossie Dibble, RN Outcome:  Completed/Met 11/13/2021 0750 by Flossie Dibble, RN Outcome: Progressing   Problem: Elimination: Goal: Will not experience complications related to bowel motility 11/13/2021 1309 by Flossie Dibble, RN Outcome: Completed/Met 11/13/2021 0750 by Flossie Dibble, RN Outcome: Progressing Goal: Will not experience complications related to urinary retention 11/13/2021 1309 by Flossie Dibble, RN Outcome: Completed/Met 11/13/2021 0750 by Flossie Dibble, RN Outcome: Progressing   Problem: Pain Managment: Goal: General experience of comfort will improve 11/13/2021 1309 by Flossie Dibble, RN Outcome: Completed/Met 11/13/2021 0750 by Flossie Dibble, RN Outcome: Progressing   Problem: Safety: Goal: Ability to remain free from injury will improve 11/13/2021 1309 by Flossie Dibble, RN Outcome: Completed/Met 11/13/2021 0750 by Flossie Dibble, RN Outcome: Progressing   Problem: Skin Integrity: Goal: Risk for impaired skin integrity will decrease 11/13/2021 1309 by Flossie Dibble, RN Outcome: Completed/Met 11/13/2021 0750 by Flossie Dibble, RN Outcome: Progressing

## 2021-11-14 ENCOUNTER — Other Ambulatory Visit: Payer: Self-pay | Admitting: General Surgery

## 2021-11-14 ENCOUNTER — Encounter: Payer: Self-pay | Admitting: Family

## 2021-11-14 ENCOUNTER — Telehealth: Payer: Self-pay | Admitting: *Deleted

## 2021-11-14 DIAGNOSIS — J189 Pneumonia, unspecified organism: Secondary | ICD-10-CM

## 2021-11-14 LAB — TYPE AND SCREEN
ABO/RH(D): A NEG
Antibody Screen: NEGATIVE
Unit division: 0
Unit division: 0
Unit division: 0

## 2021-11-14 LAB — BPAM RBC
Blood Product Expiration Date: 202311132359
Blood Product Expiration Date: 202311132359
Blood Product Expiration Date: 202311172359
ISSUE DATE / TIME: 202310291605
ISSUE DATE / TIME: 202310291740
Unit Type and Rh: 5100
Unit Type and Rh: 5100
Unit Type and Rh: 600

## 2021-11-14 MED ORDER — SULFAMETHOXAZOLE-TRIMETHOPRIM 800-160 MG PO TABS: 1.0000 | ORAL_TABLET | Freq: Two times a day (BID) | ORAL | 0 refills | Status: DC
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
## 2021-11-14 NOTE — Patient Outreach (Signed)
Care Coordination  11/14/2021  [name] 13-Sep-1975 [id number]   Transition Care Management Unsuccessful Follow-up Telephone Call  Date of discharge and from where:  11/13/21 from [facility]  Attempts:  1st Attempt  Reason for unsuccessful TCM follow-up call:  Left voice message   Lurena Joiner RN, [place] RN Care Coordinator

## 2021-11-15 ENCOUNTER — Emergency Department (HOSPITAL_COMMUNITY): Payer: Medicaid Other

## 2021-11-15 ENCOUNTER — Emergency Department (HOSPITAL_COMMUNITY)
Admission: EM | Admit: 2021-11-15 | Discharge: 2021-11-15 | Disposition: A | Discharge status: Home / Self Care | Payer: Medicaid Other | Attending: Emergency Medicine | Admitting: Emergency Medicine

## 2021-11-15 ENCOUNTER — Encounter (HOSPITAL_COMMUNITY): Payer: Self-pay | Admitting: Radiology

## 2021-11-15 DIAGNOSIS — Z79899 Other long term (current) drug therapy: Secondary | ICD-10-CM | POA: Diagnosis not present

## 2021-11-15 DIAGNOSIS — R791 Abnormal coagulation profile: Secondary | ICD-10-CM | POA: Diagnosis not present

## 2021-11-15 DIAGNOSIS — S41012A Laceration without foreign body of left shoulder, initial encounter: Secondary | ICD-10-CM | POA: Diagnosis not present

## 2021-11-15 DIAGNOSIS — L02212 Cutaneous abscess of back [any part, except buttock]: Secondary | ICD-10-CM | POA: Insufficient documentation

## 2021-11-15 DIAGNOSIS — R0602 Shortness of breath: Secondary | ICD-10-CM | POA: Diagnosis not present

## 2021-11-15 DIAGNOSIS — L089 Local infection of the skin and subcutaneous tissue, unspecified: Secondary | ICD-10-CM | POA: Diagnosis present

## 2021-11-15 DIAGNOSIS — X58XXXA Exposure to other specified factors, initial encounter: Secondary | ICD-10-CM | POA: Insufficient documentation

## 2021-11-15 DIAGNOSIS — J189 Pneumonia, unspecified organism: Secondary | ICD-10-CM | POA: Diagnosis not present

## 2021-11-15 DIAGNOSIS — E876 Hypokalemia: Secondary | ICD-10-CM | POA: Diagnosis not present

## 2021-11-15 DIAGNOSIS — L0291 Cutaneous abscess, unspecified: Secondary | ICD-10-CM

## 2021-11-15 DIAGNOSIS — J9811 Atelectasis: Secondary | ICD-10-CM | POA: Diagnosis not present

## 2021-11-15 DIAGNOSIS — Z21 Asymptomatic human immunodeficiency virus [HIV] infection status: Secondary | ICD-10-CM | POA: Insufficient documentation

## 2021-11-15 DIAGNOSIS — I10 Essential (primary) hypertension: Secondary | ICD-10-CM | POA: Diagnosis not present

## 2021-11-15 DIAGNOSIS — R000 Tachycardia, unspecified: Secondary | ICD-10-CM | POA: Diagnosis not present

## 2021-11-15 LAB — URINALYSIS, ROUTINE W REFLEX MICROSCOPIC
Bilirubin Urine: NEGATIVE
Glucose, UA: NEGATIVE mg/dL
Hgb urine dipstick: NEGATIVE
Ketones, ur: NEGATIVE mg/dL
Leukocytes,Ua: NEGATIVE
Nitrite: NEGATIVE
Protein, ur: NEGATIVE mg/dL
Specific Gravity, Urine: 1.041 — ABNORMAL HIGH (ref 1.005–1.030)
pH: 7 (ref 5.0–8.0)

## 2021-11-15 LAB — CBC WITH DIFFERENTIAL/PLATELET
Abs Immature Granulocytes: 0.06 10*3/uL (ref 0.00–0.07)
Basophils Absolute: 0 10*3/uL (ref 0.0–0.1)
Basophils Relative: 0 %
Eosinophils Absolute: 0 10*3/uL (ref 0.0–0.5)
Eosinophils Relative: 0 %
HCT: 35 % — ABNORMAL LOW (ref 36.0–46.0)
Hemoglobin: 11.7 g/dL — ABNORMAL LOW (ref 12.0–15.0)
Immature Granulocytes: 1 %
Lymphocytes Relative: 20 %
Lymphs Abs: 2.3 10*3/uL (ref 0.7–4.0)
MCH: 33 pg (ref 26.0–34.0)
MCHC: 33.4 g/dL (ref 30.0–36.0)
MCV: 98.6 fL (ref 80.0–100.0)
Monocytes Absolute: 0.8 10*3/uL (ref 0.1–1.0)
Monocytes Relative: 7 %
Neutro Abs: 8.6 10*3/uL — ABNORMAL HIGH (ref 1.7–7.7)
Neutrophils Relative %: 72 %
Platelets: 141 10*3/uL — ABNORMAL LOW (ref 150–400)
RBC: 3.55 MIL/uL — ABNORMAL LOW (ref 3.87–5.11)
RDW: 13.3 % (ref 11.5–15.5)
WBC: 11.8 10*3/uL — ABNORMAL HIGH (ref 4.0–10.5)
nRBC: 0 % (ref 0.0–0.2)

## 2021-11-15 LAB — COMPREHENSIVE METABOLIC PANEL
ALT: 21 U/L (ref 0–44)
AST: 27 U/L (ref 15–41)
Albumin: 3.1 g/dL — ABNORMAL LOW (ref 3.5–5.0)
Alkaline Phosphatase: 70 U/L (ref 38–126)
Anion gap: 10 (ref 5–15)
BUN: 10 mg/dL (ref 6–20)
CO2: 26 mmol/L (ref 22–32)
Calcium: 8.3 mg/dL — ABNORMAL LOW (ref 8.9–10.3)
Chloride: 102 mmol/L (ref 98–111)
Creatinine, Ser: 0.66 mg/dL (ref 0.44–1.00)
GFR, Estimated: >60 mL/min (ref >60)
Glucose, Bld: 145 mg/dL — ABNORMAL HIGH (ref 70–99)
Potassium: 2.9 mmol/L — ABNORMAL LOW (ref 3.5–5.1)
Sodium: 138 mmol/L (ref 135–145)
Total Bilirubin: 1.8 mg/dL — ABNORMAL HIGH (ref 0.3–1.2)
Total Protein: 6.9 g/dL (ref 6.5–8.1)

## 2021-11-15 LAB — CULTURE, RESPIRATORY W GRAM STAIN

## 2021-11-15 LAB — LACTIC ACID, PLASMA: Lactic Acid, Venous: 1.3 mmol/L (ref 0.5–1.9)

## 2021-11-15 LAB — D-DIMER, QUANTITATIVE: D-Dimer, Quant: 3.59 ug/mL-FEU — ABNORMAL HIGH (ref 0.00–0.50)

## 2021-11-15 LAB — HCG, QUANTITATIVE, PREGNANCY: hCG, Beta Chain, Quant, S: <1 m[IU]/mL (ref <5)

## 2021-11-15 MED ORDER — POTASSIUM CHLORIDE CRYS ER 20 MEQ PO TBCR
40.0000 meq | EXTENDED_RELEASE_TABLET | Freq: Once | ORAL | Status: AC
  Administered 2021-11-15: 40 meq via ORAL
  Filled 2021-11-15: qty 2

## 2021-11-15 MED ORDER — LIDOCAINE-EPINEPHRINE (PF) 2 %-1:200000 IJ SOLN
20.0000 mL | Freq: Once | INTRAMUSCULAR | Status: AC
  Administered 2021-11-15: 20 mL
  Filled 2021-11-15: qty 20

## 2021-11-15 MED ORDER — IOHEXOL 350 MG/ML SOLN
100.0000 mL | Freq: Once | INTRAVENOUS | Status: AC | PRN
  Administered 2021-11-15: 100 mL via INTRAVENOUS
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
## 2021-11-15 NOTE — ED Notes (Signed)
Patient transported to X-ray 
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
## 2021-11-15 NOTE — ED Notes (Signed)
Pt transported to CT ?
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
## 2021-11-19 ENCOUNTER — Telehealth: Payer: Self-pay | Admitting: *Deleted

## 2021-11-19 NOTE — Patient Outreach (Signed)
Transition Care Management Unsuccessful Follow-up Telephone Call  Date of discharge and from where:  11/15/21 from [facility]  Attempts:  1st Attempt  Reason for unsuccessful TCM follow-up call:  Left voice message   Lurena Joiner RN, [place] RN Care Coordinator

## 2021-11-20 ENCOUNTER — Telehealth: Payer: Self-pay | Admitting: *Deleted

## 2021-11-20 LAB — CULTURE, BLOOD (ROUTINE X 2)
Culture: NO GROWTH
Culture: NO GROWTH

## 2021-11-20 NOTE — Patient Outreach (Signed)
  Care Coordination [facility] Note Transition Care Management Unsuccessful Follow-up Telephone Call  Date of discharge and from where:  11/13/21 from [facility] and 11/3 for [facility]  Attempts:  2nd Attempt  Reason for unsuccessful TCM follow-up call:  Unable to reach patient   Lurena Joiner RN, [place] RN Care Coordinator

## 2021-11-21 ENCOUNTER — Telehealth: Payer: Self-pay | Admitting: *Deleted

## 2021-11-21 ENCOUNTER — Encounter: Payer: Self-pay | Admitting: Family

## 2021-11-21 ENCOUNTER — Ambulatory Visit (INDEPENDENT_AMBULATORY_CARE_PROVIDER_SITE_OTHER): Payer: Medicaid Other

## 2021-11-21 ENCOUNTER — Ambulatory Visit: Payer: Medicaid Other | Admitting: Family

## 2021-11-21 DIAGNOSIS — F419 Anxiety disorder, unspecified: Secondary | ICD-10-CM | POA: Diagnosis not present

## 2021-11-21 DIAGNOSIS — E1159 Type 2 diabetes mellitus with other circulatory complications: Secondary | ICD-10-CM | POA: Diagnosis not present

## 2021-11-21 DIAGNOSIS — J939 Pneumothorax, unspecified: Secondary | ICD-10-CM

## 2021-11-21 DIAGNOSIS — Z09 Encounter for follow-up examination after completed treatment for conditions other than malignant neoplasm: Secondary | ICD-10-CM | POA: Diagnosis not present

## 2021-11-21 DIAGNOSIS — E876 Hypokalemia: Secondary | ICD-10-CM | POA: Diagnosis not present

## 2021-11-21 DIAGNOSIS — I152 Hypertension secondary to endocrine disorders: Secondary | ICD-10-CM

## 2021-11-21 DIAGNOSIS — F4310 Post-traumatic stress disorder, unspecified: Secondary | ICD-10-CM

## 2021-11-21 DIAGNOSIS — J90 Pleural effusion, not elsewhere classified: Secondary | ICD-10-CM | POA: Diagnosis not present

## 2021-11-21 MED ORDER — ESCITALOPRAM OXALATE 10 MG PO TABS: 10.0000 mg | ORAL_TABLET | Freq: Every day | ORAL | 3 refills | Status: DC
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
## 2021-11-21 NOTE — Patient Instructions (Signed)
Managing Post-Traumatic Stress Disorder If you have been diagnosed with post-traumatic stress disorder (PTSD), you may be relieved that you now know why you have felt or behaved a certain way. Still, you may feel overwhelmed and concerned for your future.  With the right treatment and support, you can live your life with fewer symptoms. What actions can I take to manage PTSD? Manage stress Stress is your body's reaction to life changes and events, both good and bad. Stress can make PTSD worse. Take the following steps to manage stress: Talk with your health care provider or a counselor about ways to lower your stress. These may include: Physical exercise. Meditation, yoga, or other mind-body exercises. Exercises to relax your muscles. Breathing exercises. Listening to quiet music. Spending time outside. Eat a healthy diet. Get plenty of sleep. Take time to relax. Spend time with others. Talk with them about how you are feeling and what you need. Do activities that you enjoy. Have a schedule and take regular breaks. Take your medicines Symptoms of PTSD can make you feel depressed and anxious. They can also disrupt reality. Medicines can help lessen symptoms and make you feel better. It is important to: Talk with your pharmacist or health care provider about all medicines you take. Talk about the side effects. Discuss which medicines are safe to take together. Be involved in your care. Decide on the best treatment plan with your health care provider. Be aware that if you decide to take medicine, it can take 4-8 weeks before you notice a difference in your symptoms. Talk to your health care provider before stopping medicines. Stay close to family and friends PTSD can affect relationships. Make an effort to: Trust your friends and loved ones. Trusting others can help you feel safe and connect you with emotional support. Be open and honest about your feelings. Have fun and relax in safe  spaces, such as with friends and family. Think about going to couples counseling, family education classes, or family therapy. Therapy can be helpful for everyone. Talk with friends and family about how they can best support you. What are signs that my condition is getting worse? Be aware of your symptoms and how often you have them. The following symptoms mean that you may need additional help: You feel suspicious and angry. You have repeated flashbacks. You avoid going out or being with others. You have more fights with close friends or family members. You have thoughts about hurting yourself or others. You cannot get relief from feelings of depression or anxiety. Follow these instructions at home: Lifestyle Exercise at least 30 minutes a few times a week. Try to get 7-9 hours of sleep each night. To help with sleep: Keep your bedroom cool and dark. Do not eat a heavy meal within 1 hour of bedtime. Avoid caffeine for at least 8 hours before bed. Avoid screen time before bed. This includes television, computers, tablets, and mobile phones. Try to have fun and find humor in your life. Eat a healthy diet. Write your thoughts and feelings down in a journal or on a tablet or mobile phone. General instructions Take over-the-counter and prescription medicines only as told by your health care provider. Do not useillegal drugs. Know and remember that healing from trauma takes time. Keep your health care team up to date about your PTSD symptoms and treatment. Alcohol use Do not drink alcohol if: Your health care provider tells you not to drink. You are pregnant, may be pregnant, or are  planning to become pregnant. If you drink alcohol: Limit how much you have to: 0-1 drink a day for women. 0-2 drinks a day for men. Know how much alcohol is in your drink. In the [place] one drink equals one 12 oz bottle of beer (355 mL), one 5 oz glass of wine (148 mL), or one 1 oz glass of hard liquor (44  mL). Where to find more information For more information about PTSD, treatment, and how to get support, go to: [place]: https://www.frey.org/ National Center for PTSD: ptsd.PaintballBuzz.cz Contact a health care provider if: Your symptoms get worse or do not get better. You have trouble doing daily tasks. You have loss of appetite. You have trouble sleeping. Get help right away if: You have thoughts about hurting yourself or others. Get help right away if you feel like you may hurt yourself or others, or have thoughts about taking your own life. Go to your nearest emergency room or: Call 911. Call the [facility] at [phone] or 988. This is open 24 hours a day. Text the Crisis Text Line at 6292556816. Summary With the right treatment and support, you can live your life with fewer symptoms. Find supportive people and settings. Spend time in those places. Keep in contact with those people. Work with your health care team to create a plan to manage the symptoms of PTSD. The plan should include counseling, good habits, and techniques to reduce stress. This information is not intended to replace advice given to you by your health care provider. Make sure you discuss any questions you have with your health care provider. Document Revised: 04/19/2021 Document Reviewed: 04/19/2021 Elsevier Patient Education  [place].

## 2021-11-21 NOTE — Patient Outreach (Signed)
  Care Coordination [facility] Note Transition Care Management Unsuccessful Follow-up Telephone Call  Date of discharge and from where:  11/13/21 from [facility] and 11/15/21 from [facility]  Attempts:  3rd Attempt  Reason for unsuccessful TCM follow-up call:  Unable to reach patient   Lurena Joiner RN, [place] RN Care Coordinator

## 2021-11-22 ENCOUNTER — Other Ambulatory Visit: Payer: Self-pay | Admitting: Family

## 2021-11-22 LAB — CBC WITH DIFFERENTIAL/PLATELET
Basophils Absolute: 0.1 10*3/uL (ref 0.0–0.2)
Basos: 1 %
EOS (ABSOLUTE): 0.1 10*3/uL (ref 0.0–0.4)
Eos: 0 %
Hematocrit: 34.2 % (ref 34.0–46.6)
Hemoglobin: 11.4 g/dL (ref 11.1–15.9)
Immature Grans (Abs): 0.1 10*3/uL (ref 0.0–0.1)
Immature Granulocytes: 1 %
Lymphocytes Absolute: 3.6 10*3/uL — ABNORMAL HIGH (ref 0.7–3.1)
Lymphs: 27 %
MCH: 32.9 pg (ref 26.6–33.0)
MCHC: 33.3 g/dL (ref 31.5–35.7)
MCV: 99 fL — ABNORMAL HIGH (ref 79–97)
Monocytes Absolute: 0.9 10*3/uL (ref 0.1–0.9)
Monocytes: 7 %
Neutrophils Absolute: 8.7 10*3/uL — ABNORMAL HIGH (ref 1.4–7.0)
Neutrophils: 64 %
Platelets: 707 10*3/uL — ABNORMAL HIGH (ref 150–450)
RBC: 3.46 x10E6/uL — ABNORMAL LOW (ref 3.77–5.28)
RDW: 12.8 % (ref 11.7–15.4)
WBC: 13.4 10*3/uL — ABNORMAL HIGH (ref 3.4–10.8)

## 2021-11-22 LAB — CMP14+EGFR
ALT: 24 IU/L (ref 0–32)
AST: 15 IU/L (ref 0–40)
Albumin/Globulin Ratio: 1.2 (ref 1.2–2.2)
Albumin: 3.7 g/dL — ABNORMAL LOW (ref 3.9–4.9)
Alkaline Phosphatase: 97 IU/L (ref 44–121)
BUN/Creatinine Ratio: 9 (ref 9–23)
BUN: 8 mg/dL (ref 6–24)
Bilirubin Total: 0.3 mg/dL (ref 0.0–1.2)
CO2: 22 mmol/L (ref 20–29)
Calcium: 8.8 mg/dL (ref 8.7–10.2)
Chloride: 103 mmol/L (ref 96–106)
Creatinine, Ser: 0.85 mg/dL (ref 0.57–1.00)
Globulin, Total: 3.1 g/dL (ref 1.5–4.5)
Glucose: 144 mg/dL — ABNORMAL HIGH (ref 70–99)
Potassium: 4.2 mmol/L (ref 3.5–5.2)
Sodium: 141 mmol/L (ref 134–144)
Total Protein: 6.8 g/dL (ref 6.0–8.5)
eGFR: 86 mL/min/{1.73_m2} (ref >59)

## 2021-11-22 MED ORDER — DOXYCYCLINE HYCLATE 100 MG PO TABS: 100.0000 mg | ORAL_TABLET | Freq: Two times a day (BID) | ORAL | 0 refills | Status: DC
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
## 2021-11-28 ENCOUNTER — Telehealth: Payer: Self-pay | Admitting: Family

## 2021-11-28 ENCOUNTER — Encounter: Payer: Self-pay | Admitting: Family

## 2021-11-28 NOTE — Telephone Encounter (Signed)
Has an appointment 11/27 to rck everything because she is nervous about infection so I made her an appointment. Patient can not get in with psychiatry for like 3 mths. Needs and excuses not to go back to work because she mentally can not at this point. Please advise and I will call patient back.

## 2021-11-28 NOTE — Telephone Encounter (Signed)
Yes ok for note for how ever long. She may want to think about a different job. I think it is going to be very difficult to return there.

## 2021-11-28 NOTE — Telephone Encounter (Signed)
Patient aware and verbalized understanding. °

## 2021-11-28 NOTE — Telephone Encounter (Signed)
Patient wants to talk to nurse about her health. No other info given.

## 2021-12-04 IMAGING — DX DG BONE SURVEY MET
9 of 10 series · 9 of 10 positions shown · non-contrast
Comparison: Portable chest 07/31/2019

CLINICAL DATA: Monoclonal gammopathy. Abnormal SPEP

EXAM:
METASTATIC BONE SURVEY

[skull lat]
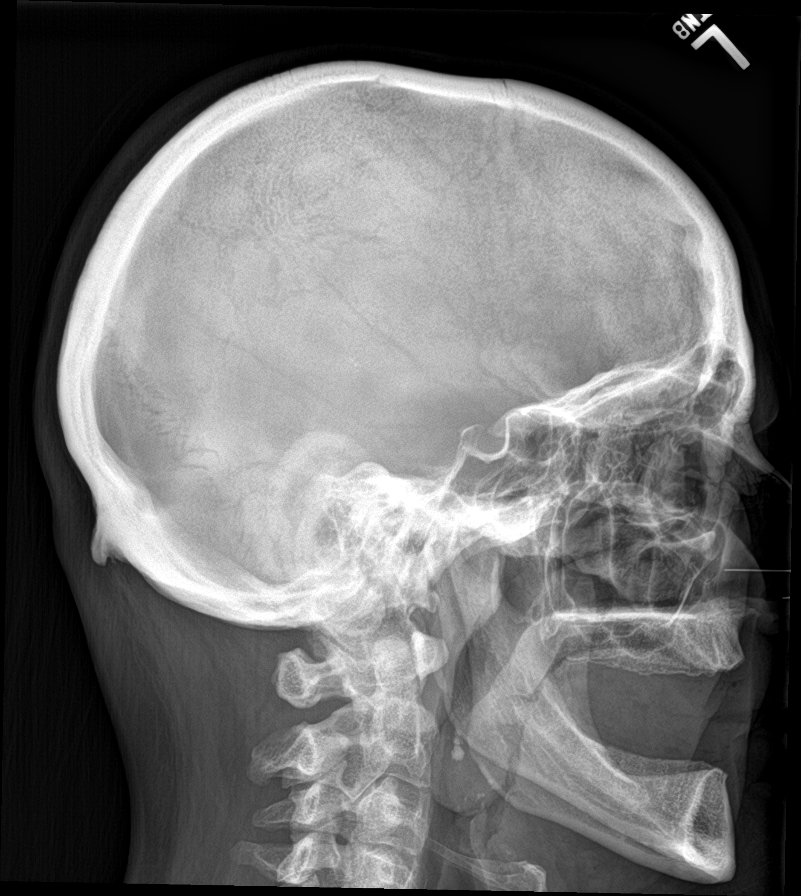

[shoulder ap (1 of 2)]
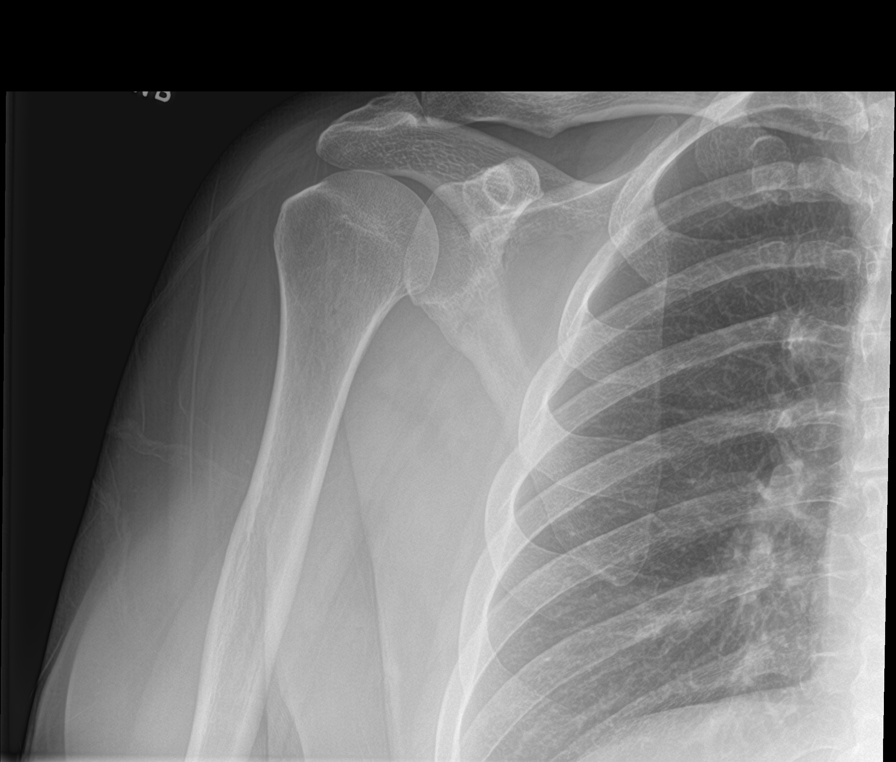

[shoulder ap (2 of 2)]
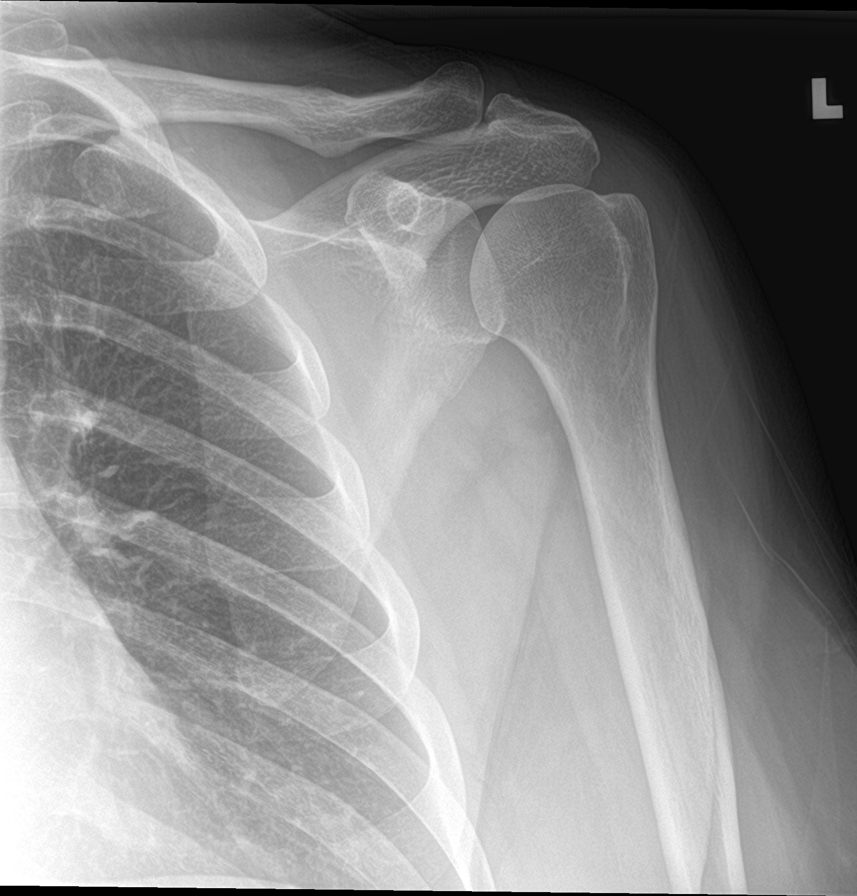

[humerus ap (1 of 2)]
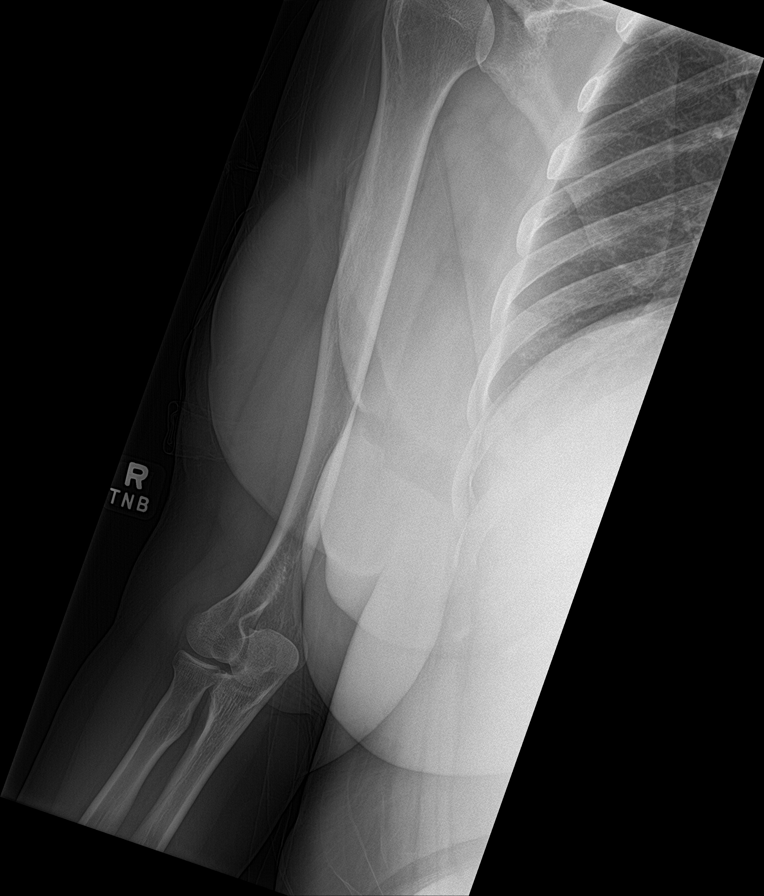

[humerus ap (2 of 2)]
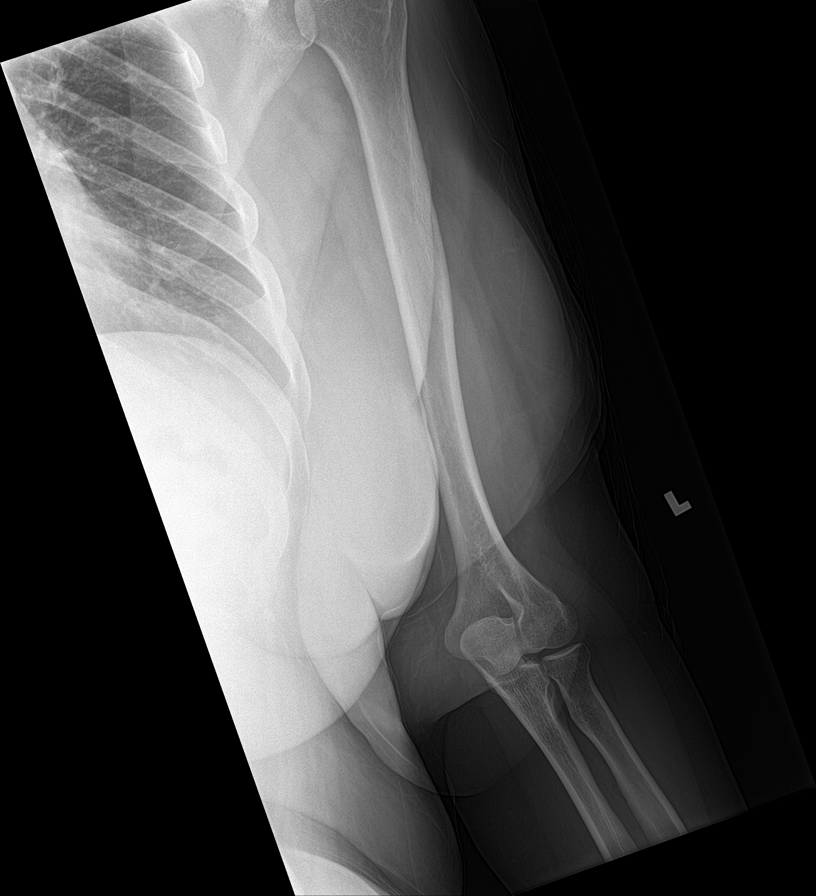

[forearm ap (1 of 2)]
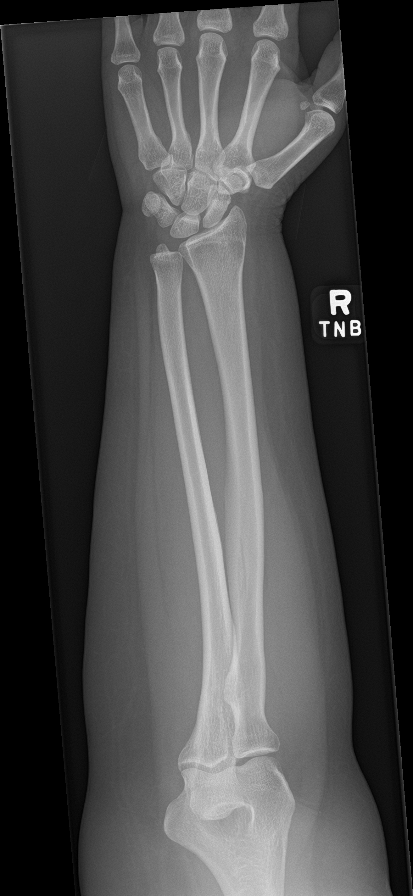

[forearm ap (2 of 2)]
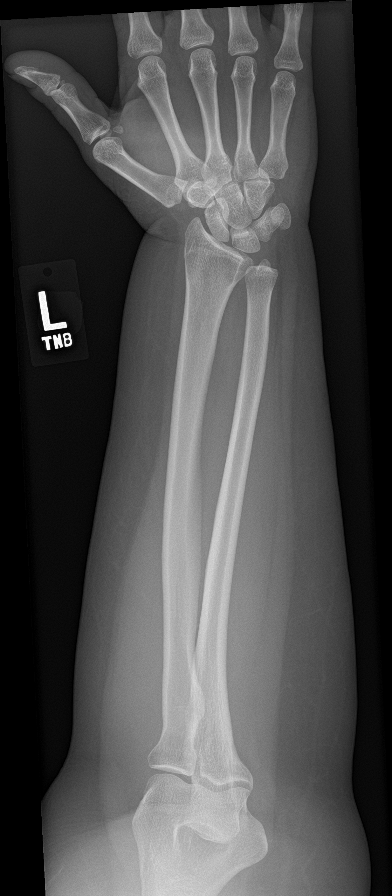

[c-spine ap]
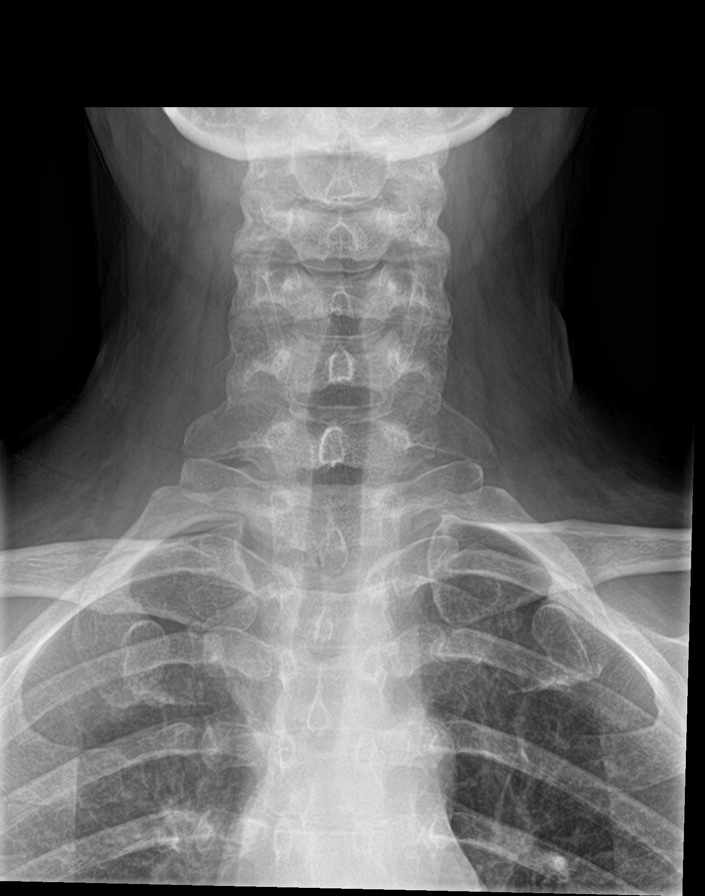

[c-spine lat]
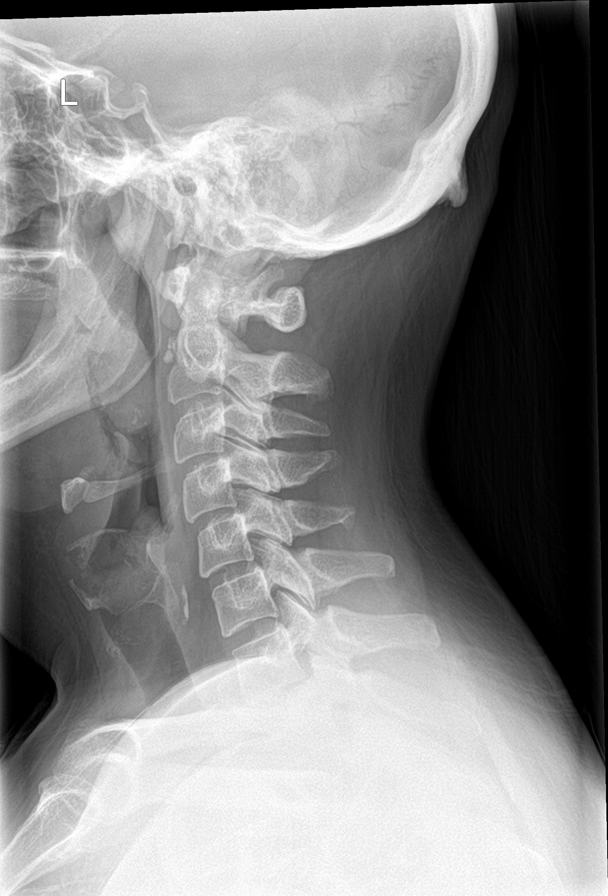

[9 of 10 positions shown; findings below may reference images not displayed]

FINDINGS: Skull:  No lytic lesions.

Cervical spine:  No lytic lesions.

Thoracic spine: No lytic lesions. Mild multilevel degenerative
changes.

Lumbar spine: No lytic lesions. Mild dextroconvex scoliosis. Lower
lumbar spine degenerative changes.

Chest:  No lytic lesions. Normal sized heart. Clear lungs.

Bilateral shoulders:  No lytic lesions.

Bilateral humeri:  No lytic lesions.

Bilateral forearms:  No lytic lesions.

Pelvis:  No lytic lesions.

Bilateral hips:  No lytic lesions.

Bilateral femurs:  No lytic lesions.

Bilateral lower legs:  No lytic lesions.
IMPRESSION: No skeletal lytic lesions seen.

## 2021-12-09 ENCOUNTER — Encounter: Payer: Self-pay | Admitting: Family

## 2021-12-09 ENCOUNTER — Ambulatory Visit (INDEPENDENT_AMBULATORY_CARE_PROVIDER_SITE_OTHER): Payer: Medicaid Other | Admitting: Family

## 2021-12-09 VITALS — BP 179/108 | HR 104 | Temp 97.5°F | Ht 68.0 in | Wt 303.0 lb

## 2021-12-09 DIAGNOSIS — I152 Hypertension secondary to endocrine disorders: Secondary | ICD-10-CM

## 2021-12-09 DIAGNOSIS — F17200 Nicotine dependence, unspecified, uncomplicated: Secondary | ICD-10-CM | POA: Diagnosis not present

## 2021-12-09 DIAGNOSIS — S21219D Laceration without foreign body of unspecified back wall of thorax without penetration into thoracic cavity, subsequent encounter: Secondary | ICD-10-CM

## 2021-12-09 DIAGNOSIS — E1159 Type 2 diabetes mellitus with other circulatory complications: Secondary | ICD-10-CM

## 2021-12-09 DIAGNOSIS — F4310 Post-traumatic stress disorder, unspecified: Secondary | ICD-10-CM

## 2021-12-09 DIAGNOSIS — F331 Major depressive disorder, recurrent, moderate: Secondary | ICD-10-CM

## 2021-12-09 DIAGNOSIS — F411 Generalized anxiety disorder: Secondary | ICD-10-CM

## 2021-12-09 DIAGNOSIS — Z6841 Body Mass Index (BMI) 40.0 and over, adult: Secondary | ICD-10-CM

## 2021-12-09 MED ORDER — ESCITALOPRAM OXALATE 20 MG PO TABS: 20.0000 mg | ORAL_TABLET | Freq: Every day | ORAL | 2 refills | Status: DC

## 2021-12-09 MED ORDER — LISINOPRIL 20 MG PO TABS: 20.0000 mg | ORAL_TABLET | Freq: Every day | ORAL | 0 refills | Status: DC

## 2021-12-09 MED ORDER — PRAZOSIN HCL 2 MG PO CAPS: 2.0000 mg | ORAL_CAPSULE | Freq: Every day | ORAL | 1 refills | Status: DC

## 2021-12-09 MED ORDER — BUSPIRONE HCL 7.5 MG PO TABS: 7.5000 mg | ORAL_TABLET | Freq: Three times a day (TID) | ORAL | 1 refills | Status: DC | PRN

## 2021-12-09 NOTE — Patient Instructions (Signed)
Managing Post-Traumatic Stress Disorder If you have been diagnosed with post-traumatic stress disorder (PTSD), you may be relieved that you now know why you have felt or behaved a certain way. Still, you may feel overwhelmed and concerned for your future.  With the right treatment and support, you can live your life with fewer symptoms. What actions can I take to manage PTSD? Manage stress Stress is your body's reaction to life changes and events, both good and bad. Stress can make PTSD worse. Take the following steps to manage stress: Talk with your health care provider or a counselor about ways to lower your stress. These may include: Physical exercise. Meditation, yoga, or other mind-body exercises. Exercises to relax your muscles. Breathing exercises. Listening to quiet music. Spending time outside. Eat a healthy diet. Get plenty of sleep. Take time to relax. Spend time with others. Talk with them about how you are feeling and what you need. Do activities that you enjoy. Have a schedule and take regular breaks. Take your medicines Symptoms of PTSD can make you feel depressed and anxious. They can also disrupt reality. Medicines can help lessen symptoms and make you feel better. It is important to: Talk with your pharmacist or health care provider about all medicines you take. Talk about the side effects. Discuss which medicines are safe to take together. Be involved in your care. Decide on the best treatment plan with your health care provider. Be aware that if you decide to take medicine, it can take 4-8 weeks before you notice a difference in your symptoms. Talk to your health care provider before stopping medicines. Stay close to family and friends PTSD can affect relationships. Make an effort to: Trust your friends and loved ones. Trusting others can help you feel safe and connect you with emotional support. Be open and honest about your feelings. Have fun and relax in safe  spaces, such as with friends and family. Think about going to couples counseling, family education classes, or family therapy. Therapy can be helpful for everyone. Talk with friends and family about how they can best support you. What are signs that my condition is getting worse? Be aware of your symptoms and how often you have them. The following symptoms mean that you may need additional help: You feel suspicious and angry. You have repeated flashbacks. You avoid going out or being with others. You have more fights with close friends or family members. You have thoughts about hurting yourself or others. You cannot get relief from feelings of depression or anxiety. Follow these instructions at home: Lifestyle Exercise at least 30 minutes a few times a week. Try to get 7-9 hours of sleep each night. To help with sleep: Keep your bedroom cool and dark. Do not eat a heavy meal within 1 hour of bedtime. Avoid caffeine for at least 8 hours before bed. Avoid screen time before bed. This includes television, computers, tablets, and mobile phones. Try to have fun and find humor in your life. Eat a healthy diet. Write your thoughts and feelings down in a journal or on a tablet or mobile phone. General instructions Take over-the-counter and prescription medicines only as told by your health care provider. Do not useillegal drugs. Know and remember that healing from trauma takes time. Keep your health care team up to date about your PTSD symptoms and treatment. Alcohol use Do not drink alcohol if: Your health care provider tells you not to drink. You are pregnant, may be pregnant, or are  planning to become pregnant. If you drink alcohol: Limit how much you have to: 0-1 drink a day for women. 0-2 drinks a day for men. Know how much alcohol is in your drink. In the [place] one drink equals one 12 oz bottle of beer (355 mL), one 5 oz glass of wine (148 mL), or one 1 oz glass of hard liquor (44  mL). Where to find more information For more information about PTSD, treatment, and how to get support, go to: [place]: https://www.frey.org/ National Center for PTSD: ptsd.PaintballBuzz.cz Contact a health care provider if: Your symptoms get worse or do not get better. You have trouble doing daily tasks. You have loss of appetite. You have trouble sleeping. Get help right away if: You have thoughts about hurting yourself or others. Get help right away if you feel like you may hurt yourself or others, or have thoughts about taking your own life. Go to your nearest emergency room or: Call 911. Call the [facility] at [phone] or 988. This is open 24 hours a day. Text the Crisis Text Line at 682-273-0768. Summary With the right treatment and support, you can live your life with fewer symptoms. Find supportive people and settings. Spend time in those places. Keep in contact with those people. Work with your health care team to create a plan to manage the symptoms of PTSD. The plan should include counseling, good habits, and techniques to reduce stress. This information is not intended to replace advice given to you by your health care provider. Make sure you discuss any questions you have with your health care provider. Document Revised: 04/19/2021 Document Reviewed: 04/19/2021 Elsevier Patient Education  [place].
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
## 2021-12-10 ENCOUNTER — Ambulatory Visit (HOSPITAL_COMMUNITY): Payer: Medicaid Other | Admitting: Physical Therapy

## 2021-12-10 ENCOUNTER — Encounter (HOSPITAL_COMMUNITY): Payer: Self-pay | Admitting: Occupational Therapy

## 2021-12-10 ENCOUNTER — Encounter (HOSPITAL_COMMUNITY): Payer: Self-pay

## 2021-12-10 ENCOUNTER — Telehealth: Payer: Self-pay | Admitting: Family

## 2021-12-10 ENCOUNTER — Ambulatory Visit (HOSPITAL_COMMUNITY): Payer: Medicaid Other | Attending: General Surgery | Admitting: Occupational Therapy

## 2021-12-10 DIAGNOSIS — M25512 Pain in left shoulder: Secondary | ICD-10-CM | POA: Insufficient documentation

## 2021-12-10 DIAGNOSIS — M25519 Pain in unspecified shoulder: Secondary | ICD-10-CM

## 2021-12-10 DIAGNOSIS — M25612 Stiffness of left shoulder, not elsewhere classified: Secondary | ICD-10-CM | POA: Diagnosis not present

## 2021-12-10 DIAGNOSIS — R29898 Other symptoms and signs involving the musculoskeletal system: Secondary | ICD-10-CM | POA: Insufficient documentation

## 2021-12-10 LAB — CMP14+EGFR
ALT: 9 IU/L (ref 0–32)
AST: 13 IU/L (ref 0–40)
Albumin/Globulin Ratio: 1.5 (ref 1.2–2.2)
Albumin: 4 g/dL (ref 3.9–4.9)
Alkaline Phosphatase: 89 IU/L (ref 44–121)
BUN/Creatinine Ratio: 12 (ref 9–23)
BUN: 11 mg/dL (ref 6–24)
Bilirubin Total: 0.3 mg/dL (ref 0.0–1.2)
CO2: 23 mmol/L (ref 20–29)
Calcium: 9.3 mg/dL (ref 8.7–10.2)
Chloride: 102 mmol/L (ref 96–106)
Creatinine, Ser: 0.93 mg/dL (ref 0.57–1.00)
Globulin, Total: 2.7 g/dL (ref 1.5–4.5)
Glucose: 113 mg/dL — ABNORMAL HIGH (ref 70–99)
Potassium: 4 mmol/L (ref 3.5–5.2)
Sodium: 142 mmol/L (ref 134–144)
Total Protein: 6.7 g/dL (ref 6.0–8.5)
eGFR: 77 mL/min/{1.73_m2} (ref >59)

## 2021-12-10 LAB — CBC WITH DIFFERENTIAL/PLATELET
Basophils Absolute: 0.1 10*3/uL (ref 0.0–0.2)
Basos: 1 %
EOS (ABSOLUTE): 0.1 10*3/uL (ref 0.0–0.4)
Eos: 1 %
Hematocrit: 39.6 % (ref 34.0–46.6)
Hemoglobin: 13.1 g/dL (ref 11.1–15.9)
Immature Grans (Abs): 0 10*3/uL (ref 0.0–0.1)
Immature Granulocytes: 0 %
Lymphocytes Absolute: 3.8 10*3/uL — ABNORMAL HIGH (ref 0.7–3.1)
Lymphs: 44 %
MCH: 32 pg (ref 26.6–33.0)
MCHC: 33.1 g/dL (ref 31.5–35.7)
MCV: 97 fL (ref 79–97)
Monocytes Absolute: 0.5 10*3/uL (ref 0.1–0.9)
Monocytes: 6 %
Neutrophils Absolute: 4.2 10*3/uL (ref 1.4–7.0)
Neutrophils: 48 %
Platelets: 454 10*3/uL — ABNORMAL HIGH (ref 150–450)
RBC: 4.1 x10E6/uL (ref 3.77–5.28)
RDW: 12.1 % (ref 11.7–15.4)
WBC: 8.7 10*3/uL (ref 3.4–10.8)
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
## 2021-12-12 NOTE — Telephone Encounter (Signed)
Referral to Ortho placed.  °

## 2021-12-13 ENCOUNTER — Ambulatory Visit (HOSPITAL_COMMUNITY): Payer: Medicaid Other | Attending: Family | Admitting: Occupational Therapy

## 2021-12-13 ENCOUNTER — Encounter (HOSPITAL_COMMUNITY): Payer: Self-pay | Admitting: Occupational Therapy

## 2021-12-13 DIAGNOSIS — R29898 Other symptoms and signs involving the musculoskeletal system: Secondary | ICD-10-CM | POA: Insufficient documentation

## 2021-12-13 DIAGNOSIS — M25612 Stiffness of left shoulder, not elsewhere classified: Secondary | ICD-10-CM | POA: Insufficient documentation

## 2021-12-13 DIAGNOSIS — M25512 Pain in left shoulder: Secondary | ICD-10-CM | POA: Diagnosis present

## 2021-12-13 NOTE — Patient Instructions (Signed)
1) SHOULDER: Flexion On Table   Place hands on towel placed on table, elbows straight. Lean forward with you upper body, pushing towel away from body.  __10_ reps per set, __3_ sets per day  2) Abduction (Passive)   With arm out to side, resting on towel placed on table with palm DOWN, keeping trunk away from table, lean to the side while pushing towel away from body.  Repeat __10__ times. Do __3__ sessions per day.  Copyright  VHI. All rights reserved.     3) Internal Rotation (Assistive)   Seated with elbow bent at right angle and held against side, slide arm on table surface in an inward arc keeping elbow anchored in place. Repeat __10__ times. Do ___3_ sessions per day. Activity: Use this motion to brush crumbs off the table.  Copyright  VHI. All rights reserved.   
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
## 2021-12-17 ENCOUNTER — Ambulatory Visit (HOSPITAL_COMMUNITY): Payer: Medicaid Other | Admitting: Occupational Therapy

## 2021-12-17 ENCOUNTER — Encounter (HOSPITAL_COMMUNITY): Payer: Self-pay | Admitting: Occupational Therapy

## 2021-12-17 DIAGNOSIS — R29898 Other symptoms and signs involving the musculoskeletal system: Secondary | ICD-10-CM

## 2021-12-17 DIAGNOSIS — M25512 Pain in left shoulder: Secondary | ICD-10-CM

## 2021-12-17 DIAGNOSIS — M25612 Stiffness of left shoulder, not elsewhere classified: Secondary | ICD-10-CM
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
## 2021-12-19 DIAGNOSIS — Z712 Person consulting for explanation of examination or test findings: Secondary | ICD-10-CM | POA: Diagnosis not present

## 2021-12-19 DIAGNOSIS — Z1389 Encounter for screening for other disorder: Secondary | ICD-10-CM | POA: Diagnosis not present

## 2021-12-19 DIAGNOSIS — Z0130 Encounter for examination of blood pressure without abnormal findings: Secondary | ICD-10-CM | POA: Diagnosis not present

## 2021-12-19 DIAGNOSIS — F4310 Post-traumatic stress disorder, unspecified: Secondary | ICD-10-CM | POA: Diagnosis not present

## 2021-12-19 DIAGNOSIS — D069 Carcinoma in situ of cervix, unspecified: Secondary | ICD-10-CM | POA: Diagnosis not present

## 2021-12-20 ENCOUNTER — Ambulatory Visit (HOSPITAL_COMMUNITY): Payer: Medicaid Other | Admitting: Occupational Therapy

## 2021-12-20 ENCOUNTER — Encounter (HOSPITAL_COMMUNITY): Payer: Self-pay | Admitting: Occupational Therapy

## 2021-12-20 DIAGNOSIS — M25512 Pain in left shoulder: Secondary | ICD-10-CM

## 2021-12-20 DIAGNOSIS — M25612 Stiffness of left shoulder, not elsewhere classified: Secondary | ICD-10-CM

## 2021-12-20 DIAGNOSIS — R29898 Other symptoms and signs involving the musculoskeletal system: Secondary | ICD-10-CM
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
## 2021-12-23 ENCOUNTER — Encounter (HOSPITAL_COMMUNITY): Payer: Medicaid Other | Admitting: Occupational Therapy

## 2022-01-02 ENCOUNTER — Ambulatory Visit: Payer: Medicaid Other | Admitting: Orthopaedic Surgery

## 2022-01-02 ENCOUNTER — Encounter (HOSPITAL_COMMUNITY): Payer: Self-pay | Admitting: Occupational Therapy

## 2022-01-02 ENCOUNTER — Ambulatory Visit (HOSPITAL_COMMUNITY): Payer: Medicaid Other | Admitting: Occupational Therapy

## 2022-01-02 ENCOUNTER — Ambulatory Visit: Payer: Self-pay

## 2022-01-02 ENCOUNTER — Encounter: Payer: Self-pay | Admitting: Orthopaedic Surgery

## 2022-01-02 VITALS — Ht 68.0 in | Wt 303.0 lb

## 2022-01-02 DIAGNOSIS — M25612 Stiffness of left shoulder, not elsewhere classified: Secondary | ICD-10-CM

## 2022-01-02 DIAGNOSIS — R29898 Other symptoms and signs involving the musculoskeletal system: Secondary | ICD-10-CM

## 2022-01-02 DIAGNOSIS — M25512 Pain in left shoulder: Secondary | ICD-10-CM | POA: Diagnosis not present
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
## 2022-01-03 ENCOUNTER — Ambulatory Visit (HOSPITAL_COMMUNITY): Payer: Medicaid Other | Admitting: Occupational Therapy

## 2022-01-03 DIAGNOSIS — R29898 Other symptoms and signs involving the musculoskeletal system: Secondary | ICD-10-CM

## 2022-01-03 DIAGNOSIS — M25512 Pain in left shoulder: Secondary | ICD-10-CM | POA: Diagnosis not present

## 2022-01-03 DIAGNOSIS — M25612 Stiffness of left shoulder, not elsewhere classified: Secondary | ICD-10-CM

## 2022-01-07 DIAGNOSIS — R030 Elevated blood-pressure reading, without diagnosis of hypertension: Secondary | ICD-10-CM | POA: Diagnosis not present

## 2022-01-07 DIAGNOSIS — Z20828 Contact with and (suspected) exposure to other viral communicable diseases: Secondary | ICD-10-CM | POA: Diagnosis not present

## 2022-01-07 DIAGNOSIS — R059 Cough, unspecified: Secondary | ICD-10-CM | POA: Diagnosis not present

## 2022-01-08 ENCOUNTER — Ambulatory Visit (HOSPITAL_COMMUNITY): Payer: Medicaid Other | Admitting: Occupational Therapy

## 2022-01-08 ENCOUNTER — Encounter (HOSPITAL_COMMUNITY): Payer: Self-pay | Admitting: Occupational Therapy

## 2022-01-08 DIAGNOSIS — M25512 Pain in left shoulder: Secondary | ICD-10-CM | POA: Diagnosis not present

## 2022-01-08 DIAGNOSIS — R29898 Other symptoms and signs involving the musculoskeletal system: Secondary | ICD-10-CM

## 2022-01-08 DIAGNOSIS — M25612 Stiffness of left shoulder, not elsewhere classified: Secondary | ICD-10-CM
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
## 2022-01-08 NOTE — Patient Instructions (Signed)
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
## 2022-01-09 NOTE — Unmapped (Signed)
[facility] Refill Coordination Note    Specialty Medication(s) to be Shipped:   Inflammatory Disorders: Cosentyx    Other medication(s) to be shipped: No additional medications requested for fill at this time     Morgan Edwards, DOB: 01/04/1976  Phone: [phone] (home)       All above HIPAA information was verified with patient.     Was a Nurse, learning disability used for this call? No    Completed refill call assessment today to schedule patient's medication shipment from the [facility] [phone]).  All relevant notes have been reviewed.     Specialty medication(s) and dose(s) confirmed: Regimen is correct and unchanged.   Changes to medications: Dova reports no changes at this time.  Changes to insurance: No  New side effects reported not previously addressed with a pharmacist or physician: None reported  Questions for the pharmacist: No    Confirmed patient received a Conservation officer, historic buildings and a Surveyor, mining with first shipment. The patient will receive a drug information handout for each medication shipped and additional FDA Medication Guides as required.       DISEASE/MEDICATION-SPECIFIC INFORMATION        For patients on injectable medications: Patient currently has 0 doses left.  Next injection is scheduled for 01/17/22.    SPECIALTY MEDICATION ADHERENCE     Medication Adherence    Patient reported X missed doses in the last month: 0  Specialty Medication: COSENTYX PEN (2 PENS) 150 mg/mL  Patient is on additional specialty medications: No  Patient is on more than two specialty medications: No  Any gaps in refill history greater than 2 weeks in the last 3 months: no  Demonstrates understanding of importance of adherence: yes                          Were doses missed due to medication being on hold? Yes - pt  had phneumonia      COSENTYX PEN (2 PENS) 150  mg/ml: 0 days of medicine on hand       REFERRAL TO PHARMACIST     Referral to the pharmacist: Not needed      [facility]     Shipping address confirmed in Epic.     Delivery Scheduled: Yes, Expected medication delivery date: 01/16/22.     Medication will be delivered via UPS to the prescription address in Epic WAM.    Ricci Barker   [facility] Specialty Technician

## 2022-01-10 ENCOUNTER — Ambulatory Visit (HOSPITAL_COMMUNITY): Payer: Medicaid Other | Admitting: Occupational Therapy

## 2022-01-10 ENCOUNTER — Encounter (HOSPITAL_COMMUNITY): Payer: Self-pay | Admitting: Occupational Therapy

## 2022-01-10 DIAGNOSIS — R29898 Other symptoms and signs involving the musculoskeletal system: Secondary | ICD-10-CM

## 2022-01-10 DIAGNOSIS — M25512 Pain in left shoulder: Secondary | ICD-10-CM | POA: Diagnosis not present

## 2022-01-10 DIAGNOSIS — M25612 Stiffness of left shoulder, not elsewhere classified: Secondary | ICD-10-CM
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
## 2022-01-14 ENCOUNTER — Other Ambulatory Visit: Payer: Self-pay | Admitting: Family

## 2022-01-14 ENCOUNTER — Ambulatory Visit
Admission: RE | Admit: 2022-01-14 | Discharge: 2022-01-14 | Disposition: A | Discharge status: Home / Self Care | Payer: Medicaid Other | Source: Ambulatory Visit | Attending: Family | Admitting: Family

## 2022-01-14 DIAGNOSIS — Z1231 Encounter for screening mammogram for malignant neoplasm of breast: Secondary | ICD-10-CM

## 2022-01-15 ENCOUNTER — Encounter (HOSPITAL_COMMUNITY): Payer: Self-pay | Admitting: Occupational Therapy

## 2022-01-15 ENCOUNTER — Ambulatory Visit (HOSPITAL_COMMUNITY): Payer: Medicaid Other | Attending: Family | Admitting: Occupational Therapy

## 2022-01-15 DIAGNOSIS — M25512 Pain in left shoulder: Secondary | ICD-10-CM | POA: Diagnosis not present

## 2022-01-15 DIAGNOSIS — R29898 Other symptoms and signs involving the musculoskeletal system: Secondary | ICD-10-CM | POA: Diagnosis present

## 2022-01-15 DIAGNOSIS — M25612 Stiffness of left shoulder, not elsewhere classified: Secondary | ICD-10-CM | POA: Insufficient documentation

## 2022-01-15 NOTE — Patient Instructions (Signed)
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
## 2022-01-16 DIAGNOSIS — R7303 Prediabetes: Secondary | ICD-10-CM | POA: Diagnosis not present

## 2022-01-16 DIAGNOSIS — I10 Essential (primary) hypertension: Secondary | ICD-10-CM | POA: Diagnosis not present

## 2022-01-16 DIAGNOSIS — F4310 Post-traumatic stress disorder, unspecified: Secondary | ICD-10-CM | POA: Diagnosis not present

## 2022-01-16 MED FILL — COSENTYX PEN 300 MG/2 PENS (150 MG/ML) SUBCUTANEOUS: SUBCUTANEOUS | 28 days supply | Qty: 2 | Fill #3

## 2022-01-17 ENCOUNTER — Encounter: Payer: Self-pay | Admitting: Family

## 2022-01-17 ENCOUNTER — Ambulatory Visit: Payer: Medicaid Other | Admitting: Family

## 2022-01-21 ENCOUNTER — Encounter (HOSPITAL_COMMUNITY): Payer: Medicaid Other | Admitting: Occupational Therapy

## 2022-01-24 ENCOUNTER — Encounter (HOSPITAL_COMMUNITY): Payer: Self-pay | Admitting: Occupational Therapy

## 2022-01-24 ENCOUNTER — Ambulatory Visit (HOSPITAL_COMMUNITY): Payer: Medicaid Other | Admitting: Occupational Therapy

## 2022-01-24 DIAGNOSIS — M25612 Stiffness of left shoulder, not elsewhere classified: Secondary | ICD-10-CM

## 2022-01-24 DIAGNOSIS — R29898 Other symptoms and signs involving the musculoskeletal system: Secondary | ICD-10-CM

## 2022-01-24 DIAGNOSIS — M25512 Pain in left shoulder: Secondary | ICD-10-CM
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
## 2022-01-29 ENCOUNTER — Encounter (HOSPITAL_COMMUNITY): Payer: Medicaid Other | Admitting: Occupational Therapy

## 2022-01-31 ENCOUNTER — Encounter (HOSPITAL_COMMUNITY): Payer: Medicaid Other | Admitting: Occupational Therapy

## 2022-02-02 ENCOUNTER — Other Ambulatory Visit: Payer: Self-pay | Admitting: Family

## 2022-02-02 NOTE — Telephone Encounter (Signed)
Last office visit 12/09/21 Medication is on med list but listed as historical.  Per protocol provider must approve refill

## 2022-02-03 ENCOUNTER — Encounter (HOSPITAL_COMMUNITY): Payer: Medicaid Other | Admitting: Occupational Therapy

## 2022-02-06 ENCOUNTER — Ambulatory Visit (HOSPITAL_COMMUNITY): Payer: Medicaid Other | Admitting: Occupational Therapy

## 2022-02-06 ENCOUNTER — Encounter (HOSPITAL_COMMUNITY): Payer: Self-pay | Admitting: Occupational Therapy

## 2022-02-06 DIAGNOSIS — M25512 Pain in left shoulder: Secondary | ICD-10-CM | POA: Diagnosis not present

## 2022-02-06 DIAGNOSIS — R29898 Other symptoms and signs involving the musculoskeletal system: Secondary | ICD-10-CM

## 2022-02-06 DIAGNOSIS — M25612 Stiffness of left shoulder, not elsewhere classified: Secondary | ICD-10-CM

## 2022-02-06 NOTE — Patient Instructions (Signed)
Complete 10-15 repetitions each. Complete 2-3 time a day.  Wall taps with theraband  With a looped elastic band around forearms/wrists and arms at a 90 angle pressed against the wall. Keeping elbows on the wall, tap right arm out to the right. Hold for 1 second. Return right arm back to neutral. Repeat with left arm.    Wall V slides with Theraband  Place a band loop around hands/forearms and face a wall. Extend both arms diagonally into a V shape on the wall. Hold this stretch for specified amount of time. Lower arms slowly back into neutral position. Repeat.       scap clocks  Tie a loop with a theraband and place around your wrists.  Stand with a wall in front of you.  Picture a clock in front of you.  Place both palms on the wall, arms straight.  While keeping the left/right hand planted, use the right/left hand to pull away and tap each number (1, 3, 5- right OR 11,9,7 left), coming back to center each time.
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
## 2022-02-10 NOTE — Unmapped (Signed)
[facility] Refill Coordination Note    Specialty Medication(s) to be Shipped:   Inflammatory Disorders: Cosentyx    Other medication(s) to be shipped: No additional medications requested for fill at this time     Morgan Edwards, DOB: 07/31/1975  Phone: [phone] (home)       All above HIPAA information was verified with patient.     Was a Nurse, learning disability used for this call? No    Completed refill call assessment today to schedule patient's medication shipment from the [facility] [phone]).  All relevant notes have been reviewed.     Specialty medication(s) and dose(s) confirmed: Regimen is correct and unchanged.   Changes to medications: [name] reports no changes at this time.  Changes to insurance: No  New side effects reported not previously addressed with a pharmacist or physician: None reported  Questions for the pharmacist: No    Confirmed patient received a Conservation officer, historic buildings and a Surveyor, mining with first shipment. The patient will receive a drug information handout for each medication shipped and additional FDA Medication Guides as required.       DISEASE/MEDICATION-SPECIFIC INFORMATION        For patients on injectable medications: Patient currently has 0 doses left.  Next injection is scheduled for 2/8.    SPECIALTY MEDICATION ADHERENCE     Medication Adherence    Patient reported X missed doses in the last month: 0  Specialty Medication: Cosentyx  Patient is on additional specialty medications: No                                Were doses missed due to medication being on hold? No    Cosentyx 150 mg/ml: 0 days of medicine on hand        REFERRAL TO PHARMACIST     Referral to the pharmacist: Not needed      [facility]     Shipping address confirmed in Epic.     Delivery Scheduled: Yes, Expected medication delivery date: 02/18/22.     Medication will be delivered via UPS to the prescription address in Epic WAM.    Willette Pa   [facility] Specialty Technician

## 2022-02-10 NOTE — Unmapped (Signed)
[facility] Clinical Intervention    Type of intervention: Drug interaction    Medication involved: Cosentyx    Problem identified: Patient wanted to know if crestor would interact with Cosentyx    Intervention performed: Told patient it would not interact and reviewed medications she was on    Follow-up needed: N/A    Approximate time spent: 5-10 minutes    Clinical evidence used to support intervention: Drug information resource    Result of the intervention: Prevention of an adverse drug event    Sherral Hammers, PharmD   [facility] Specialty Pharmacist

## 2022-02-12 ENCOUNTER — Ambulatory Visit (HOSPITAL_COMMUNITY): Payer: Medicaid Other | Admitting: Occupational Therapy

## 2022-02-12 ENCOUNTER — Encounter (HOSPITAL_COMMUNITY): Payer: Self-pay | Admitting: Occupational Therapy

## 2022-02-12 DIAGNOSIS — M25612 Stiffness of left shoulder, not elsewhere classified: Secondary | ICD-10-CM

## 2022-02-12 DIAGNOSIS — M25512 Pain in left shoulder: Secondary | ICD-10-CM

## 2022-02-12 DIAGNOSIS — R29898 Other symptoms and signs involving the musculoskeletal system: Secondary | ICD-10-CM

## 2022-02-12 DIAGNOSIS — F4311 Post-traumatic stress disorder, acute: Secondary | ICD-10-CM | POA: Diagnosis not present
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
## 2022-02-17 MED FILL — COSENTYX PEN 300 MG/2 PENS (150 MG/ML) SUBCUTANEOUS: SUBCUTANEOUS | 28 days supply | Qty: 2 | Fill #4

## 2022-02-19 ENCOUNTER — Ambulatory Visit (HOSPITAL_COMMUNITY): Payer: Medicaid Other | Attending: Family | Admitting: Occupational Therapy

## 2022-02-19 ENCOUNTER — Encounter (HOSPITAL_COMMUNITY): Payer: Self-pay | Admitting: Occupational Therapy

## 2022-02-19 DIAGNOSIS — R29898 Other symptoms and signs involving the musculoskeletal system: Secondary | ICD-10-CM | POA: Diagnosis present

## 2022-02-19 DIAGNOSIS — M25512 Pain in left shoulder: Secondary | ICD-10-CM | POA: Insufficient documentation

## 2022-02-19 DIAGNOSIS — M25612 Stiffness of left shoulder, not elsewhere classified: Secondary | ICD-10-CM
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
## 2022-02-19 NOTE — Patient Instructions (Signed)

## 2022-02-27 ENCOUNTER — Encounter (HOSPITAL_COMMUNITY): Payer: Self-pay | Admitting: Occupational Therapy

## 2022-02-27 DIAGNOSIS — F4311 Post-traumatic stress disorder, acute: Secondary | ICD-10-CM | POA: Diagnosis not present

## 2022-03-05 ENCOUNTER — Ambulatory Visit (HOSPITAL_COMMUNITY): Payer: Medicaid Other | Admitting: Occupational Therapy

## 2022-03-05 ENCOUNTER — Encounter (HOSPITAL_COMMUNITY): Payer: Self-pay | Admitting: Occupational Therapy

## 2022-03-05 DIAGNOSIS — M25612 Stiffness of left shoulder, not elsewhere classified: Secondary | ICD-10-CM

## 2022-03-05 DIAGNOSIS — R29898 Other symptoms and signs involving the musculoskeletal system: Secondary | ICD-10-CM

## 2022-03-05 DIAGNOSIS — M25512 Pain in left shoulder: Secondary | ICD-10-CM | POA: Diagnosis not present
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
## 2022-03-05 NOTE — Patient Instructions (Signed)

## 2022-03-07 DIAGNOSIS — F4311 Post-traumatic stress disorder, acute: Secondary | ICD-10-CM | POA: Diagnosis not present

## 2022-03-13 NOTE — Unmapped (Signed)
[facility] Refill Coordination Note    Specialty Medication(s) to be Shipped:   Inflammatory Disorders: Cosentyx    Other medication(s) to be shipped: No additional medications requested for fill at this time     Morgan Edwards, DOB: 1975/11/11  Phone: [phone] (home)       All above HIPAA information was verified with patient.     Was a Nurse, learning disability used for this call? No    Completed refill call assessment today to schedule patient's medication shipment from the [facility] [phone]).  All relevant notes have been reviewed.     Specialty medication(s) and dose(s) confirmed: Regimen is correct and unchanged.   Changes to medications: Morgan Edwards reports no changes at this time.  Changes to insurance: No  New side effects reported not previously addressed with a pharmacist or physician: None reported  Questions for the pharmacist: No    Confirmed patient received a Conservation officer, historic buildings and a Surveyor, mining with first shipment. The patient will receive a drug information handout for each medication shipped and additional FDA Medication Guides as required.       DISEASE/MEDICATION-SPECIFIC INFORMATION        For patients on injectable medications: Patient currently has 0 doses left.  Next injection is scheduled for 03/23/22.    SPECIALTY MEDICATION ADHERENCE     Medication Adherence    Patient reported X missed doses in the last month: 0  Specialty Medication: COSENTYX PEN (2 PENS) 150 mg/mL  Patient is on additional specialty medications: No              Were doses missed due to medication being on hold? No    Cosentyx 150 mg/ml: 0 days of medicine on hand        REFERRAL TO PHARMACIST     Referral to the pharmacist: Not needed      [facility]     Shipping address confirmed in Epic.     Patient was notified of new phone menu : No    Delivery Scheduled: Yes, Expected medication delivery date: 03/20/22.     Medication will be delivered via UPS to the prescription address in Epic WAM.    Unk Lightning   [facility] Specialty Technician

## 2022-03-17 DIAGNOSIS — F4311 Post-traumatic stress disorder, acute: Secondary | ICD-10-CM | POA: Diagnosis not present

## 2022-03-19 ENCOUNTER — Telehealth: Payer: Self-pay | Admitting: Family

## 2022-03-19 MED FILL — COSENTYX PEN 300 MG/2 PENS (150 MG/ML) SUBCUTANEOUS: SUBCUTANEOUS | 28 days supply | Qty: 2 | Fill #5

## 2022-03-19 NOTE — Telephone Encounter (Signed)
Patient needs a letter stating that her mental effect she is not able to work.

## 2022-03-19 NOTE — Telephone Encounter (Signed)
Patient called requesting to speak with nurse regarding the therapist she is seeing.

## 2022-03-19 NOTE — Telephone Encounter (Signed)
Lmtcb.

## 2022-03-20 NOTE — Telephone Encounter (Signed)
Patient aware and verbalized understanding. Letter handed to the front

## 2022-03-20 NOTE — Telephone Encounter (Signed)
Letter written.   [name], FNP

## 2022-03-26 ENCOUNTER — Other Ambulatory Visit: Payer: Self-pay | Admitting: Family

## 2022-03-26 DIAGNOSIS — L409 Psoriasis, unspecified: Secondary | ICD-10-CM

## 2022-03-26 NOTE — Telephone Encounter (Signed)
[facility] patient Last office visit 12/09/21 Last refill 05/10/21, 60 grams, 2 refills

## 2022-04-03 ENCOUNTER — Other Ambulatory Visit: Payer: Self-pay | Admitting: Family

## 2022-04-03 DIAGNOSIS — E1159 Type 2 diabetes mellitus with other circulatory complications: Secondary | ICD-10-CM

## 2022-04-11 DIAGNOSIS — L409 Psoriasis, unspecified: Principal | ICD-10-CM

## 2022-04-11 MED ORDER — COSENTYX PEN 300 MG/2 PENS (150 MG/ML) SUBCUTANEOUS
SUBCUTANEOUS | 5 refills | 28 days
Start: 2022-04-11 — End: ?

## 2022-04-11 NOTE — Unmapped (Signed)
[facility] Refill Coordination Note    Specialty Medication(s) to be Shipped:   Inflammatory Disorders: Cosentyx    Other medication(s) to be shipped: No additional medications requested for fill at this time     Zanya Bonura, DOB: 1975-02-01  Phone: [phone] (home)       All above HIPAA information was verified with patient.     Was a Nurse, learning disability used for this call? No    Completed refill call assessment today to schedule patient's medication shipment from the [facility] [phone]).  All relevant notes have been reviewed.     Specialty medication(s) and dose(s) confirmed: Regimen is correct and unchanged.   Changes to medications: Abcde reports no changes at this time.  Changes to insurance: No  New side effects reported not previously addressed with a pharmacist or physician: None reported  Questions for the pharmacist: No    Confirmed patient received a Conservation officer, historic buildings and a Surveyor, mining with first shipment. The patient will receive a drug information handout for each medication shipped and additional FDA Medication Guides as required.       DISEASE/MEDICATION-SPECIFIC INFORMATION        For patients on injectable medications: Patient currently has 0 doses left.  Next injection is scheduled for 04/23/22.    SPECIALTY MEDICATION ADHERENCE     Medication Adherence    Patient reported X missed doses in the last month: 0  Specialty Medication: Cosentyx  Patient is on additional specialty medications: No  Patient is on more than two specialty medications: No  Any gaps in refill history greater than 2 weeks in the last 3 months: no  Demonstrates understanding of importance of adherence: yes              Were doses missed due to medication being on hold? No    Cosentyx 150 mg/ml: 0 days of medicine on hand        REFERRAL TO PHARMACIST     Referral to the pharmacist: Not needed      [facility]     Shipping address confirmed in Epic.     Patient was notified of new phone menu : No    Delivery Scheduled: Yes, Expected medication delivery date: 04/18/22.  However, Rx request for refills was sent to the provider as there are none remaining.     Medication will be delivered via UPS to the prescription address in Epic WAM.    Ricci Barker   [facility] Specialty Technician

## 2022-04-14 MED ORDER — COSENTYX PEN 300 MG/2 PENS (150 MG/ML) SUBCUTANEOUS
SUBCUTANEOUS | 5 refills | 28.00000 days | Status: CP
Start: 2022-04-14 — End: ?
  Filled 2022-04-17: qty 2, 28d supply, fill #0

## 2022-04-17 MED FILL — EMPTY CONTAINER: 120 days supply | Qty: 1 | Fill #2

## 2022-04-23 ENCOUNTER — Ambulatory Visit
Admit: 2022-04-23 | Discharge: 2022-04-24 | Payer: BLUE CROSS/BLUE SHIELD | Attending: Student in an Organized Health Care Education/Training Program | Primary: Student in an Organized Health Care Education/Training Program

## 2022-04-23 DIAGNOSIS — N879 Dysplasia of cervix uteri, unspecified: Principal | ICD-10-CM

## 2022-04-23 DIAGNOSIS — Z538 Procedure and treatment not carried out for other reasons: Secondary | ICD-10-CM | POA: Diagnosis not present

## 2022-04-23 MED ADMIN — sodium bicarbonate 1 mEq/mL (8.4 %) injection 10 mEq: 10 meq | @ 17:00:00 | Stop: 2022-04-23

## 2022-04-23 MED ADMIN — lidocaine (XYLOCAINE) 10 mg/mL (1 %) injection 20 mL: 20 mL | @ 17:00:00 | Stop: 2022-04-23

## 2022-04-23 MED ADMIN — LORazepam (ATIVAN) tablet 1 mg: 1 mg | ORAL | @ 17:00:00 | Stop: 2022-04-23

## 2022-04-23 MED ADMIN — vasopressin (VASOSTRICT) injection 5 Units: 5 [IU] | SUBCUTANEOUS | @ 17:00:00 | Stop: 2022-04-23

## 2022-04-23 NOTE — Unmapped (Signed)
Addended by: Reginia Forts A on: 04/23/2022 12:48 PM     Modules accepted: Orders

## 2022-04-23 NOTE — Unmapped (Signed)
Request for Gynecologic Surgery    Requested Surgery: LEEP  Financial Clearance: Medicaid  Attending: Neysa Hotter  BMI: 49  Accepts tranfusion: Yes     HPI: 47 y.o. 

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
## 2022-04-23 NOTE — Unmapped (Signed)
Telephone call to patient to schedule appointment- patient in agreement with date, time, and place.    Pt's anesthesia evaluation to be completed by NEWVIDEOHCPMYCHART    Pt has an active MyChart Account.  Pt states they will be physically located in the state of [place] at time of visit.  Pt states they have a smartphone/tablet and access to Wifi/4G.    Pt meets all requirements to be scheduled as a Video Visit.    Pt will sign into my [facility] Chart account 15 minutes prior to appointment.

## 2022-04-24 ENCOUNTER — Telehealth: Admit: 2022-04-24 | Discharge: 2022-04-25 | Payer: BLUE CROSS/BLUE SHIELD

## 2022-04-24 DIAGNOSIS — F411 Generalized anxiety disorder: Principal | ICD-10-CM

## 2022-04-24 DIAGNOSIS — E119 Type 2 diabetes mellitus without complications: Principal | ICD-10-CM

## 2022-04-24 DIAGNOSIS — Z720 Tobacco use: Principal | ICD-10-CM

## 2022-04-24 DIAGNOSIS — Z01818 Encounter for other preprocedural examination: Principal | ICD-10-CM

## 2022-04-24 DIAGNOSIS — K219 Gastro-esophageal reflux disease without esophagitis: Principal | ICD-10-CM

## 2022-04-24 DIAGNOSIS — G629 Polyneuropathy, unspecified: Principal | ICD-10-CM

## 2022-04-24 DIAGNOSIS — G4733 Obstructive sleep apnea (adult) (pediatric): Principal | ICD-10-CM

## 2022-04-24 DIAGNOSIS — I10 Essential (primary) hypertension: Principal | ICD-10-CM

## 2022-04-24 DIAGNOSIS — N879 Dysplasia of cervix uteri, unspecified: Principal | ICD-10-CM

## 2022-04-24 DIAGNOSIS — B20 Human immunodeficiency virus [HIV] disease: Principal | ICD-10-CM

## 2022-04-24 NOTE — Unmapped (Addendum)
The type of anesthesia reviewed for your surgery was general anesthesia . The final plan will be discussed the morning of surgery by your anesthesia care team.  .    Please follow the eating and drinking instructions reviewed by the  Pre-Procedure Services Clinic nurse.  Consider drinking 8 to 12 ounces of a sports drink (gatorade, powerade) at least 2 hours prior to your arrival time for the procedure   Please take only the medications listed below       The night before your surgery do not eat after midnight. Clear liquids that are allowed include only Water, Apple Juice, Gatorade, Sodas, and Black Coffee. Clear liquids must be stopped TWO hours before your scheduled arrival time.      YOUR DAY OF SURGERY - DO's  DO bring your identification (driver's license, social security card or Medicare/Medicaid card) and Insurance card.  DO have someone to drive you home.  DO have someone that will stay with you for 24 hours after surgery for outpatient procedures.   DO wear loose, comfortable clothes - two piece outfit for breast and eye surgery.  DO leave all valuables at home.  DO brush your teeth.  DO bring any inhalers you use at home.  DO bring your CPAP machine.     YOUR DAY OF SURGERY - DO NOT's  DO NOT eat, chew gum, or candy.  DO NOT use lotion, powder, perfume or deodorant.  DO NOT use hairspray, gel or any hair products.  DO NOT shave your surgical area (up to two days before).  DO NOT wear makeup, jewelry (including body piercings), or nail polish  DO NOT smoke, use tobacco, or drink any alcohol for 24 hours before surgery.  DO NOT wear contacts.     Preoperative Showering Instructions:  Before your surgery you can play an important part in your health.  Getting your skin ready before surgery lowers your risk of infection.  You will need to shower with an antibacterial soap, for example, Dial or Safeguard soap that you can buy from a local store.   Follow these instructions the night before AND the morning of surgery:  Wash your hair and face as you normally would.  Rinse Well  Turn off the water and apply soap to your skin from the neck down using a clean bath cloth for approximately 3 minutes. Turn the water back on and rinse the soap off well. Dry off with a clean towel.    Do not shave in the area of your surgery or apply any products to your skin.   Dress in clean clothes    Per Anesthesia's guidelines:    Please take the following medications the morning of your procedure with a sip of water:    Amlodipine  Dovato  Omeprazole  Gabapentin    Albuterol inhaler- if needed  Flonase Nasal Spray

## 2022-05-01 ENCOUNTER — Encounter: Payer: Self-pay | Admitting: Family

## 2022-05-01 ENCOUNTER — Telehealth: Payer: Self-pay | Admitting: Family

## 2022-05-01 ENCOUNTER — Ambulatory Visit (INDEPENDENT_AMBULATORY_CARE_PROVIDER_SITE_OTHER): Payer: Medicaid Other | Admitting: Family

## 2022-05-01 VITALS — BP 132/89 | HR 95 | Temp 98.0°F | Ht 68.0 in | Wt 315.6 lb

## 2022-05-01 DIAGNOSIS — E785 Hyperlipidemia, unspecified: Secondary | ICD-10-CM

## 2022-05-01 DIAGNOSIS — Z0000 Encounter for general adult medical examination without abnormal findings: Secondary | ICD-10-CM

## 2022-05-01 DIAGNOSIS — E1159 Type 2 diabetes mellitus with other circulatory complications: Secondary | ICD-10-CM | POA: Diagnosis not present

## 2022-05-01 DIAGNOSIS — E1169 Type 2 diabetes mellitus with other specified complication: Secondary | ICD-10-CM

## 2022-05-01 DIAGNOSIS — E1142 Type 2 diabetes mellitus with diabetic polyneuropathy: Secondary | ICD-10-CM | POA: Diagnosis not present

## 2022-05-01 DIAGNOSIS — Z0001 Encounter for general adult medical examination with abnormal findings: Secondary | ICD-10-CM | POA: Diagnosis not present

## 2022-05-01 DIAGNOSIS — K219 Gastro-esophageal reflux disease without esophagitis: Secondary | ICD-10-CM

## 2022-05-01 DIAGNOSIS — T1490XA Injury, unspecified, initial encounter: Secondary | ICD-10-CM

## 2022-05-01 DIAGNOSIS — L409 Psoriasis, unspecified: Secondary | ICD-10-CM

## 2022-05-01 DIAGNOSIS — F411 Generalized anxiety disorder: Secondary | ICD-10-CM

## 2022-05-01 DIAGNOSIS — B20 Human immunodeficiency virus [HIV] disease: Secondary | ICD-10-CM

## 2022-05-01 DIAGNOSIS — E119 Type 2 diabetes mellitus without complications: Secondary | ICD-10-CM | POA: Diagnosis not present

## 2022-05-01 DIAGNOSIS — F331 Major depressive disorder, recurrent, moderate: Secondary | ICD-10-CM | POA: Diagnosis not present

## 2022-05-01 DIAGNOSIS — I152 Hypertension secondary to endocrine disorders: Secondary | ICD-10-CM | POA: Diagnosis not present

## 2022-05-01 DIAGNOSIS — F17200 Nicotine dependence, unspecified, uncomplicated: Secondary | ICD-10-CM

## 2022-05-01 DIAGNOSIS — E559 Vitamin D deficiency, unspecified: Secondary | ICD-10-CM

## 2022-05-01 DIAGNOSIS — Z6841 Body Mass Index (BMI) 40.0 and over, adult: Secondary | ICD-10-CM

## 2022-05-01 DIAGNOSIS — F4310 Post-traumatic stress disorder, unspecified: Secondary | ICD-10-CM

## 2022-05-01 LAB — BAYER DCA HB A1C WAIVED: HB A1C (BAYER DCA - WAIVED): 5.7 % — ABNORMAL HIGH (ref 4.8–5.6)

## 2022-05-01 MED ORDER — BLOOD GLUCOSE TEST VI STRP
1.0000 | ORAL_STRIP | Freq: Three times a day (TID) | 0 refills | Status: DC
Start: 2022-05-01 — End: 2022-05-29

## 2022-05-01 MED ORDER — LANCET DEVICE MISC
1.0000 | Freq: Three times a day (TID) | 0 refills | Status: AC
Start: 2022-05-01 — End: 2022-05-31

## 2022-05-01 MED ORDER — LANCETS MISC. MISC
1.0000 | Freq: Three times a day (TID) | 0 refills | Status: AC
Start: 2022-05-01 — End: 2022-05-31

## 2022-05-01 MED ORDER — MOUNJARO 2.5 MG/0.5ML ~~LOC~~ SOAJ
2.5000 mg | SUBCUTANEOUS | 3 refills | Status: DC
Start: 2022-05-01 — End: 2022-05-26

## 2022-05-01 MED ORDER — BLOOD GLUCOSE MONITORING SUPPL DEVI
1.0000 | Freq: Three times a day (TID) | 0 refills | Status: AC
Start: 2022-05-01 — End: ?

## 2022-05-01 MED ORDER — ROSUVASTATIN CALCIUM 5 MG PO TABS: 5.0000 mg | ORAL_TABLET | ORAL | 3 refills | Status: DC

## 2022-05-01 MED ORDER — CETIRIZINE HCL 10 MG PO TABS: 10.0000 mg | ORAL_TABLET | Freq: Every day | ORAL | 1 refills | Status: DC
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
## 2022-05-01 NOTE — Patient Instructions (Signed)

## 2022-05-01 NOTE — Telephone Encounter (Signed)
  Name from pharmacy: MOUNJARO 2.5 MG/0.5 ML PEN    Pharmacy comment: Alternative Requested:PRIOR USE OF METFORMIN REQUIRED- PRIOR AUHTORIZATION NEEDED.

## 2022-05-02 ENCOUNTER — Inpatient Hospital Stay: Payer: Medicaid Other | Attending: Physician Assistant

## 2022-05-02 ENCOUNTER — Ambulatory Visit (HOSPITAL_COMMUNITY)
Admission: RE | Admit: 2022-05-02 | Discharge: 2022-05-02 | Disposition: A | Discharge status: Home / Self Care | Payer: Medicaid Other | Source: Ambulatory Visit | Attending: Physician Assistant | Admitting: Physician Assistant

## 2022-05-02 DIAGNOSIS — D472 Monoclonal gammopathy: Secondary | ICD-10-CM

## 2022-05-02 DIAGNOSIS — M7989 Other specified soft tissue disorders: Secondary | ICD-10-CM | POA: Diagnosis not present

## 2022-05-02 DIAGNOSIS — R9389 Abnormal findings on diagnostic imaging of other specified body structures: Secondary | ICD-10-CM | POA: Insufficient documentation

## 2022-05-02 DIAGNOSIS — F17210 Nicotine dependence, cigarettes, uncomplicated: Secondary | ICD-10-CM | POA: Diagnosis not present

## 2022-05-02 LAB — CMP14+EGFR
ALT: 11 IU/L (ref 0–32)
AST: 9 IU/L (ref 0–40)
Albumin/Globulin Ratio: 1.8 (ref 1.2–2.2)
Albumin: 4.2 g/dL (ref 3.9–4.9)
Alkaline Phosphatase: 84 IU/L (ref 44–121)
BUN/Creatinine Ratio: 11 (ref 9–23)
BUN: 10 mg/dL (ref 6–24)
Bilirubin Total: 0.6 mg/dL (ref 0.0–1.2)
CO2: 23 mmol/L (ref 20–29)
Calcium: 9.4 mg/dL (ref 8.7–10.2)
Chloride: 102 mmol/L (ref 96–106)
Creatinine, Ser: 0.87 mg/dL (ref 0.57–1.00)
Globulin, Total: 2.3 g/dL (ref 1.5–4.5)
Glucose: 120 mg/dL — ABNORMAL HIGH (ref 70–99)
Potassium: 4.2 mmol/L (ref 3.5–5.2)
Sodium: 138 mmol/L (ref 134–144)
Total Protein: 6.5 g/dL (ref 6.0–8.5)
eGFR: 83 mL/min/{1.73_m2} (ref >59)

## 2022-05-02 LAB — COMPREHENSIVE METABOLIC PANEL
ALT: 12 U/L (ref 0–44)
AST: 12 U/L — ABNORMAL LOW (ref 15–41)
Albumin: 3.6 g/dL (ref 3.5–5.0)
Alkaline Phosphatase: 65 U/L (ref 38–126)
Anion gap: 9 (ref 5–15)
BUN: 10 mg/dL (ref 6–20)
CO2: 24 mmol/L (ref 22–32)
Calcium: 8.5 mg/dL — ABNORMAL LOW (ref 8.9–10.3)
Chloride: 104 mmol/L (ref 98–111)
Creatinine, Ser: 0.86 mg/dL (ref 0.44–1.00)
GFR, Estimated: >60 mL/min (ref >60)
Glucose, Bld: 126 mg/dL — ABNORMAL HIGH (ref 70–99)
Potassium: 3.7 mmol/L (ref 3.5–5.1)
Sodium: 137 mmol/L (ref 135–145)
Total Bilirubin: 0.6 mg/dL (ref 0.3–1.2)
Total Protein: 6.8 g/dL (ref 6.5–8.1)

## 2022-05-02 LAB — LIPID PANEL
Chol/HDL Ratio: 4.5 ratio — ABNORMAL HIGH (ref 0.0–4.4)
Cholesterol, Total: 174 mg/dL (ref 100–199)
HDL: 39 mg/dL — ABNORMAL LOW (ref >39)
LDL Chol Calc (NIH): 115 mg/dL — ABNORMAL HIGH (ref 0–99)
Triglycerides: 107 mg/dL (ref 0–149)
VLDL Cholesterol Cal: 20 mg/dL (ref 5–40)

## 2022-05-02 LAB — CBC WITH DIFFERENTIAL/PLATELET
Abs Immature Granulocytes: 0.04 10*3/uL (ref 0.00–0.07)
Basophils Absolute: 0.1 10*3/uL (ref 0.0–0.1)
Basophils Relative: 1 %
Eosinophils Absolute: 0.1 10*3/uL (ref 0.0–0.5)
Eosinophils Relative: 1 %
HCT: 43 % (ref 36.0–46.0)
Hemoglobin: 13.9 g/dL (ref 12.0–15.0)
Immature Granulocytes: 0 %
Lymphocytes Relative: 42 %
Lymphs Abs: 3.8 10*3/uL (ref 0.7–4.0)
MCH: 32 pg (ref 26.0–34.0)
MCHC: 32.3 g/dL (ref 30.0–36.0)
MCV: 99.1 fL (ref 80.0–100.0)
Monocytes Absolute: 0.5 10*3/uL (ref 0.1–1.0)
Monocytes Relative: 6 %
Neutro Abs: 4.6 10*3/uL (ref 1.7–7.7)
Neutrophils Relative %: 50 %
Platelets: 336 10*3/uL (ref 150–400)
RBC: 4.34 MIL/uL (ref 3.87–5.11)
RDW: 13.9 % (ref 11.5–15.5)
WBC: 9.1 10*3/uL (ref 4.0–10.5)
nRBC: 0 % (ref 0.0–0.2)

## 2022-05-02 LAB — LACTATE DEHYDROGENASE: LDH: 105 U/L (ref 98–192)

## 2022-05-02 LAB — TSH: TSH: 1.36 u[IU]/mL (ref 0.450–4.500)

## 2022-05-05 ENCOUNTER — Telehealth: Payer: Self-pay

## 2022-05-05 LAB — KAPPA/LAMBDA LIGHT CHAINS
Kappa free light chain: 23.2 mg/L — ABNORMAL HIGH (ref 3.3–19.4)
Kappa, lambda light chain ratio: 1.76 — ABNORMAL HIGH (ref 0.26–1.65)
Lambda free light chains: 13.2 mg/L (ref 5.7–26.3)

## 2022-05-05 NOTE — Telephone Encounter (Signed)
Jasmine Peck (Key: [facility]) PA Case ID #: [id number] Rx #: [id number] Need Help? Call us at [phone] Outcome Approved today PA Case: [id number], Status: Approved, Coverage Starts on: 05/05/2022 12:00:00 AM, Coverage Ends on: 05/05/2023 12:00:00 AM. Authorization Expiration Date: 05/04/2023 Drug Greggory Keen 2.5MG /0.5ML pen-injectors ePA cloud logo Form [employer] Electronic [place] Form [phone] NCPDP)  Pharmacy informed

## 2022-05-06 LAB — PROTEIN ELECTROPHORESIS, SERUM
A/G Ratio: 1.1 (ref 0.7–1.7)
Albumin ELP: 3.3 g/dL (ref 2.9–4.4)
Alpha-1-Globulin: 0.2 g/dL (ref 0.0–0.4)
Alpha-2-Globulin: 0.8 g/dL (ref 0.4–1.0)
Beta Globulin: 1 g/dL (ref 0.7–1.3)
Gamma Globulin: 1 g/dL (ref 0.4–1.8)
Globulin, Total: 3.1 g/dL (ref 2.2–3.9)
M-Spike, %: 0.3 g/dL — ABNORMAL HIGH
Total Protein ELP: 6.4 g/dL (ref 6.0–8.5)
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
## 2022-05-09 ENCOUNTER — Other Ambulatory Visit: Payer: Self-pay | Admitting: Family

## 2022-05-09 ENCOUNTER — Inpatient Hospital Stay (HOSPITAL_BASED_OUTPATIENT_CLINIC_OR_DEPARTMENT_OTHER): Payer: Medicaid Other | Admitting: Physician Assistant

## 2022-05-09 VITALS — BP 152/100 | HR 75 | Temp 98.5°F | Resp 18 | Ht 68.0 in | Wt 316.2 lb

## 2022-05-09 DIAGNOSIS — F17210 Nicotine dependence, cigarettes, uncomplicated: Secondary | ICD-10-CM | POA: Diagnosis not present

## 2022-05-09 DIAGNOSIS — I152 Hypertension secondary to endocrine disorders: Secondary | ICD-10-CM

## 2022-05-09 DIAGNOSIS — D472 Monoclonal gammopathy: Secondary | ICD-10-CM | POA: Diagnosis not present

## 2022-05-09 DIAGNOSIS — K219 Gastro-esophageal reflux disease without esophagitis: Secondary | ICD-10-CM

## 2022-05-09 DIAGNOSIS — E1142 Type 2 diabetes mellitus with diabetic polyneuropathy: Secondary | ICD-10-CM

## 2022-05-09 DIAGNOSIS — R9389 Abnormal findings on diagnostic imaging of other specified body structures: Secondary | ICD-10-CM | POA: Diagnosis not present

## 2022-05-09 NOTE — Patient Instructions (Addendum)
[facility] at [facility] **VISIT SUMMARY & IMPORTANT INSTRUCTIONS **   You were seen today by Rojelio Brenner PA-C for your MGUS.   Your MGUS labs are stable.  You continue to have an abnormal protein (M spike and elevated kappa light chains), but these have not increased compared to your last labs. You do not have any evidence of multiple myeloma cancer at this time. You will be due for repeat labs and whole-body x-rays in 6 months.  Please remember that when you come for your labs in 6 months you will also need to go to Radiology to get your x-rays done.  FOLLOW-UP APPOINTMENT: Office visit in 6 months (1 week after labs and x-rays)  ** Thank you for trusting me with your healthcare!  I strive to provide all of my patients with quality care at each visit.  If you receive a survey for this visit, I would be so grateful to you for taking the time to provide feedback.  Thank you in advance!  ~ Auriana Scalia                   Dr. Doreatha Massed   &   Rojelio Brenner, PA-C   - - - - - - - - - - - - - - - - - -    Thank you for choosing [facility] at [facility] to provide your oncology and hematology care.  To afford each patient quality time with our provider, please arrive at least 15 minutes before your scheduled appointment time.   If you have a lab appointment with the Cancer Center please come in thru the Main Entrance and check in at the main information desk.  You need to re-schedule your appointment should you arrive 10 or more minutes late.  We strive to give you quality time with our providers, and arriving late affects you and other patients whose appointments are after yours.  Also, if you no show three or more times for appointments you may be dismissed from the clinic at the providers discretion.     Again, thank you for choosing [facility].  Our hope is that these requests will decrease the amount of time that you  wait before being seen by our physicians.       _____________________________________________________________  Should you have questions after your visit to [facility], please contact our office at [phone] and follow the prompts.  Our office hours are 8:00 a.m. and 4:30 p.m. Monday - Friday.  Please note that voicemails left after 4:00 p.m. may not be returned until the following business day.  We are closed weekends and major holidays.  You do have access to a nurse 24-7, just call the main number to the clinic [phone] and do not press any options, hold on the line and a nurse will answer the phone.    For prescription refill requests, have your pharmacy contact our office and allow 72 hours.

## 2022-05-12 ENCOUNTER — Other Ambulatory Visit: Payer: Self-pay

## 2022-05-12 DIAGNOSIS — D472 Monoclonal gammopathy: Secondary | ICD-10-CM

## 2022-05-13 DIAGNOSIS — L409 Psoriasis, unspecified: Principal | ICD-10-CM

## 2022-05-13 NOTE — Unmapped (Signed)
Thank you for receiving care at [facility]! If you have any questions or concerns you have two ways to contact our team:     1) Nurse line: [phone]     2) MyChart messages: These messages are checked by the nurses during normal business hours 8:30 am-4:30 pm Monday-Friday every 24-48 hours and are for non-urgent, non-emergent concerns. You may be asked to return for a follow up visit if it is deemed your questions are best handled in the clinic setting.

## 2022-05-13 NOTE — Unmapped (Signed)
Addended by: Lamar Benes on: 05/13/2022 04:55 PM     Modules accepted: Level of Service

## 2022-05-13 NOTE — Unmapped (Signed)
[facility] Refill Coordination Note    Specialty Medication(s) to be Shipped:   Inflammatory Disorders: Cosentyx    Other medication(s) to be shipped: No additional medications requested for fill at this time     Morgan Edwards, DOB: 07-Nov-1975  Phone: [phone] (home)       All above HIPAA information was verified with patient.     Was a Nurse, learning disability used for this call? No    Completed refill call assessment today to schedule patient's medication shipment from the [facility] [phone]).  All relevant notes have been reviewed.     Specialty medication(s) and dose(s) confirmed: Regimen is correct and unchanged.   Changes to medications: Morgan Edwards reports no changes at this time.  Changes to insurance: No  New side effects reported not previously addressed with a pharmacist or physician: None reported  Questions for the pharmacist: No    Confirmed patient received a Conservation officer, historic buildings and a Surveyor, mining with first shipment. The patient will receive a drug information handout for each medication shipped and additional FDA Medication Guides as required.       DISEASE/MEDICATION-SPECIFIC INFORMATION        For patients on injectable medications: Patient currently has 0 doses left.  Next injection is scheduled for 05/21/22.    SPECIALTY MEDICATION ADHERENCE     Medication Adherence    Patient reported X missed doses in the last month: 0  Specialty Medication: COSENTYX PEN (2 PENS) 150 mg/mL  Patient is on additional specialty medications: No  Informant: patient              Were doses missed due to medication being on hold? No    Cosentyx 150 mg/ml: 0 days of medicine on hand        REFERRAL TO PHARMACIST     Referral to the pharmacist: Not needed      [facility]     Shipping address confirmed in Epic.     Patient was notified of new phone menu : No    Delivery Scheduled: Yes, Expected medication delivery date: 5/3.     Medication will be delivered via UPS to the prescription address in Epic WAM.    [name]   [facility] Specialty Technician

## 2022-05-15 MED FILL — COSENTYX PEN 300 MG/2 PENS (150 MG/ML) SUBCUTANEOUS: SUBCUTANEOUS | 28 days supply | Qty: 2 | Fill #1

## 2022-05-20 ENCOUNTER — Other Ambulatory Visit (HOSPITAL_COMMUNITY): Payer: Self-pay

## 2022-05-20 NOTE — Telephone Encounter (Signed)
PA has been approved. Ran test claim, came back refill too soon.

## 2022-05-26 ENCOUNTER — Telehealth: Payer: Self-pay | Admitting: Family

## 2022-05-26 MED ORDER — TIRZEPATIDE 5 MG/0.5ML ~~LOC~~ SOAJ: 5.0000 mg | SUBCUTANEOUS | 2 refills | Status: DC

## 2022-05-26 NOTE — Telephone Encounter (Signed)
Patient wants to talk to PCP, refused any information. [place] it was personal.

## 2022-05-26 NOTE — Telephone Encounter (Signed)
Mounjaro 5 mg Prescription sent to pharmacy    Jannifer Rodney, FNP

## 2022-05-26 NOTE — Telephone Encounter (Signed)
Needs to up tirzepatide [facility]) to the next dosage. [facility] [place]

## 2022-05-26 NOTE — Telephone Encounter (Signed)
Lmtcb.

## 2022-05-29 ENCOUNTER — Other Ambulatory Visit: Payer: Self-pay | Admitting: Family

## 2022-05-29 DIAGNOSIS — E1142 Type 2 diabetes mellitus with diabetic polyneuropathy: Secondary | ICD-10-CM

## 2022-06-06 NOTE — Unmapped (Signed)
[facility] Refill Coordination Note    Specialty Medication(s) to be Shipped:   Inflammatory Disorders: Cosentyx    Other medication(s) to be shipped: No additional medications requested for fill at this time     Morgan Edwards, DOB: [birth year]  Phone: [phone] (home)       All above HIPAA information was verified with patient.     Was a Nurse, learning disability used for this call? No    Completed refill call assessment today to schedule patient's medication shipment from the [facility] [phone]).  All relevant notes have been reviewed.     Specialty medication(s) and dose(s) confirmed: Regimen is correct and unchanged.   Changes to medications: Marit reports starting the following medications: Mounjaro 0.5mg  once weekly (discontinued Ozempic)  Changes to insurance: No  New side effects reported not previously addressed with a pharmacist or physician: None reported  Questions for the pharmacist: No    Confirmed patient received a Conservation officer, historic buildings and a Surveyor, mining with first shipment. The patient will receive a drug information handout for each medication shipped and additional FDA Medication Guides as required.       DISEASE/MEDICATION-SPECIFIC INFORMATION        For patients on injectable medications: Patient currently has 0 doses left.  Next injection is scheduled for 06/18/22.    SPECIALTY MEDICATION ADHERENCE     Medication Adherence    Patient reported X missed doses in the last month: 0  Specialty Medication: secukinumab (COSENTYX PEN, 2 PENS,) 150 mg/mL PnIj injection  Informant: patient              Were doses missed due to medication being on hold? No    Cosentyx 150 mg/ml: 0 days of medicine on hand        REFERRAL TO PHARMACIST     Referral to the pharmacist: Not needed      [facility]     Shipping address confirmed in Epic.     Patient was notified of new phone menu : No    Delivery Scheduled: Yes, Expected medication delivery date: 5/31. Medication will be delivered via UPS to the prescription address in Epic WAM.    [name]   [facility] Specialty Technician

## 2022-06-12 MED FILL — COSENTYX PEN 300 MG/2 PENS (150 MG/ML) SUBCUTANEOUS: SUBCUTANEOUS | 28 days supply | Qty: 2 | Fill #2

## 2022-06-19 ENCOUNTER — Other Ambulatory Visit: Payer: Self-pay | Admitting: Family

## 2022-06-19 DIAGNOSIS — E1159 Type 2 diabetes mellitus with other circulatory complications: Secondary | ICD-10-CM

## 2022-06-20 ENCOUNTER — Ambulatory Visit: Payer: Medicaid Other | Admitting: Family

## 2022-06-20 ENCOUNTER — Encounter: Payer: Self-pay | Admitting: Family

## 2022-06-20 VITALS — BP 142/83 | HR 91 | Temp 98.4°F | Ht 68.0 in | Wt 306.0 lb

## 2022-06-20 DIAGNOSIS — E1142 Type 2 diabetes mellitus with diabetic polyneuropathy: Secondary | ICD-10-CM | POA: Diagnosis not present

## 2022-06-20 DIAGNOSIS — F331 Major depressive disorder, recurrent, moderate: Secondary | ICD-10-CM | POA: Diagnosis not present

## 2022-06-20 DIAGNOSIS — Z6841 Body Mass Index (BMI) 40.0 and over, adult: Secondary | ICD-10-CM | POA: Diagnosis not present

## 2022-06-20 DIAGNOSIS — B20 Human immunodeficiency virus [HIV] disease: Secondary | ICD-10-CM

## 2022-06-20 DIAGNOSIS — E1159 Type 2 diabetes mellitus with other circulatory complications: Secondary | ICD-10-CM | POA: Diagnosis not present

## 2022-06-20 DIAGNOSIS — I152 Hypertension secondary to endocrine disorders: Secondary | ICD-10-CM

## 2022-06-20 DIAGNOSIS — F4310 Post-traumatic stress disorder, unspecified: Secondary | ICD-10-CM | POA: Diagnosis not present

## 2022-06-20 DIAGNOSIS — E785 Hyperlipidemia, unspecified: Secondary | ICD-10-CM | POA: Diagnosis not present

## 2022-06-20 DIAGNOSIS — E1169 Type 2 diabetes mellitus with other specified complication: Secondary | ICD-10-CM | POA: Diagnosis not present

## 2022-06-20 DIAGNOSIS — F411 Generalized anxiety disorder: Secondary | ICD-10-CM | POA: Diagnosis not present

## 2022-06-20 DIAGNOSIS — K219 Gastro-esophageal reflux disease without esophagitis: Secondary | ICD-10-CM | POA: Diagnosis not present

## 2022-06-20 DIAGNOSIS — L4050 Arthropathic psoriasis, unspecified: Secondary | ICD-10-CM

## 2022-06-20 DIAGNOSIS — F17200 Nicotine dependence, unspecified, uncomplicated: Secondary | ICD-10-CM

## 2022-06-20 MED ORDER — TIRZEPATIDE 5 MG/0.5ML ~~LOC~~ SOAJ
5.0000 mg | SUBCUTANEOUS | 2 refills | Status: DC
Start: 2022-06-20 — End: 2022-11-06

## 2022-06-20 NOTE — Patient Instructions (Signed)
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
## 2022-06-23 DIAGNOSIS — I10 Essential (primary) hypertension: Secondary | ICD-10-CM | POA: Diagnosis not present

## 2022-06-23 DIAGNOSIS — E119 Type 2 diabetes mellitus without complications: Secondary | ICD-10-CM | POA: Diagnosis not present

## 2022-06-23 DIAGNOSIS — B20 Human immunodeficiency virus [HIV] disease: Secondary | ICD-10-CM | POA: Diagnosis not present

## 2022-07-01 ENCOUNTER — Other Ambulatory Visit: Payer: Self-pay | Admitting: Family

## 2022-07-01 DIAGNOSIS — E1142 Type 2 diabetes mellitus with diabetic polyneuropathy: Secondary | ICD-10-CM
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
## 2022-07-02 ENCOUNTER — Other Ambulatory Visit: Payer: Self-pay | Admitting: Family

## 2022-07-02 ENCOUNTER — Ambulatory Visit
Admit: 2022-07-02 | Discharge: 2022-07-02 | Payer: BLUE CROSS/BLUE SHIELD | Attending: Student in an Organized Health Care Education/Training Program | Primary: Student in an Organized Health Care Education/Training Program

## 2022-07-02 ENCOUNTER — Other Ambulatory Visit: Admit: 2022-07-02 | Discharge: 2022-07-02 | Payer: BLUE CROSS/BLUE SHIELD

## 2022-07-02 DIAGNOSIS — Z79899 Other long term (current) drug therapy: Principal | ICD-10-CM

## 2022-07-02 DIAGNOSIS — L409 Psoriasis, unspecified: Principal | ICD-10-CM

## 2022-07-02 DIAGNOSIS — E1142 Type 2 diabetes mellitus with diabetic polyneuropathy: Secondary | ICD-10-CM

## 2022-07-02 MED ORDER — UREA 40 % TOPICAL FOAM
5 refills | 0.00000 days | Status: CP
Start: 2022-07-02 — End: ?

## 2022-07-02 MED ORDER — UREA 40 % LOTION
5 refills | 0.00000 days | Status: CP
Start: 2022-07-02 — End: ?

## 2022-07-02 MED ORDER — CLOBETASOL 0.05 % TOPICAL OINTMENT
Freq: Two times a day (BID) | TOPICAL | 2 refills | 0.00000 days | Status: CP | PRN
Start: 2022-07-02 — End: 2023-07-02

## 2022-07-02 NOTE — Telephone Encounter (Signed)
BYETTA 5 MCG PEN 5 MCG/0.02ML SOPN injection        Changed from: tirzepatide Greggory Keen) 5 MG/0.5ML Pen   Pharmacy comment: Alternative Requested:PRIOR AUTH REQUIRED.   All Pharmacy Suggested Alternatives:  exenatide (BYETTA 5 MCG PEN) 5 MCG/0.02ML SOPN injection liraglutide (VICTOZA) 18 MG/3ML SOPN Dulaglutide (TRULICITY) 0.75 MG/0.5ML SOPN Semaglutide,0.25 or 0.5MG /DOS, (OZEMPIC, 0.25 OR 0.5 MG/DOSE,) 2 MG/3ML SOPN

## 2022-07-02 NOTE — Unmapped (Signed)
I discussed this patient's case including the history, clinical findings and assessment and plan with the resident physician.  I reviewed the resident???s note.  I agree with the plan as documented in the resident???s note.  I was available.    Adah Stoneberg, MD

## 2022-07-03 NOTE — Unmapped (Signed)
[facility] Clinical Assessment & Refill Coordination Note    Morgan Edwards, DOB: 1975-06-04  Phone: [phone] (home)     All above HIPAA information was verified with patient.     Was a Nurse, learning disability used for this call? No    Specialty Medication(s):   Inflammatory Disorders: Cosentyx     Current Outpatient Medications   Medication Sig Dispense Refill    albuterol HFA 90 mcg/actuation inhaler Inhale 2 puffs every six (6) hours as needed. 18 g 0    amLODIPine (NORVASC) 10 MG tablet Take 1 tablet (10 mg total) by mouth daily.      cetirizine (ZYRTEC) 10 MG tablet Take 1 tablet (10 mg total) by mouth daily.      clobetasol (TEMOVATE) 0.05 % ointment Apply topically two (2) times a day as needed. Use on stubborn areas (feet, elbows, etc) until smooth. 60 g 2    DOVATO 50-300 mg Tab Take 1 tablet by mouth daily.      empty container Misc use as directed to dispose of Cosentyx 1 each 2    fluticasone propionate (FLONASE) 50 mcg/actuation nasal spray 2 sprays into each nostril daily as needed.      gabapentin (NEURONTIN) 600 MG tablet Take 1 tablet (600 mg total) by mouth two (2) times a day.      lisinopriL (PRINIVIL,ZESTRIL) 20 MG tablet Take 1 tablet (20 mg total) by mouth daily.      MOUNJARO 5 mg/0.5 mL PnIj Inject 5 mg under the skin.      omeprazole (PRILOSEC) 20 MG capsule Take 1 capsule (20 mg total) by mouth daily.      secukinumab (COSENTYX PEN) 150 mg/mL PnIj injection Inject under the skin.      secukinumab (COSENTYX PEN, 2 PENS,) 150 mg/mL PnIj injection Inject the contents of 2 pens (300 mg total) under the skin every twenty-eight (28) days. Maintenance dose. 2 mL 5    urea 40 % Foam Apply daily to feet after baths or showers. 115 g 5    urea 40 % Lotn Apply at night as needed for dryness 226 g 5     No current facility-administered medications for this visit.        Changes to medications: Jacoria reports no changes at this time.    Allergies   Allergen Reactions Atorvastatin Other (See Comments)     Severe Thrush     Clindamycin Rash    Erythromycin Diarrhea, Other (See Comments) and Itching     Abdominal pain       Changes to allergies: No    SPECIALTY MEDICATION ADHERENCE     secukinumab (COSENTYX PEN) 150 mg/mL PnIj injection: 0 days of medicine on hand   Medication Adherence    Patient reported X missed doses in the last month: 0  Specialty Medication: secukinumab (COSENTYX PEN) 150 mg/mL PnIj injection  Informant: patient  Confirmed plan for next specialty medication refill: delivery by pharmacy  Refills needed for supportive medications: not needed          Specialty medication(s) dose(s) confirmed: Regimen is correct and unchanged.     Are there any concerns with adherence? No    Adherence counseling provided? Not needed    CLINICAL MANAGEMENT AND INTERVENTION      Clinical Benefit Assessment:    Do you feel the medicine is effective or helping your condition? Yes    Clinical Benefit counseling provided? Not needed    Adverse  Effects Assessment:    Are you experiencing any side effects? No    Are you experiencing difficulty administering your medicine? No    Quality of Life Assessment:    Quality of Life    Rheumatology  Oncology  Dermatology  1. What impact has your specialty medication had on the symptoms of your skin condition (i.e. itchiness, soreness, stinging)?: Tremendous  2. What impact has your specialty medication had on your comfort level with your skin?: Tremendous  Cystic Fibrosis          How many days over the past month did your PP  keep you from your normal activities? For example, brushing your teeth or getting up in the morning. 0    Have you discussed this with your provider? Not needed    Acute Infection Status:    Acute infections noted within Epic:  MRSA  Patient reported infection: None    Therapy Appropriateness:    Is therapy appropriate and patient progressing towards therapeutic goals? Yes, therapy is appropriate and should be continued    DISEASE/MEDICATION-SPECIFIC INFORMATION      For patients on injectable medications: Patient currently has 0 doses left.  Next injection is scheduled for 07/10/2022.    Chronic Inflammatory Diseases: Have you experienced any flares in the last month? No    PATIENT SPECIFIC NEEDS     Does the patient have any physical, cognitive, or cultural barriers? No    Is the patient high risk? No    Did the patient require a clinical intervention? No    Does the patient require physician intervention or other additional services (i.e., nutrition, smoking cessation, social work)? No    SOCIAL DETERMINANTS OF HEALTH     At the [facility], we have learned that life circumstances - like trouble affording food, housing, utilities, or transportation can affect the health of many of our patients.   That is why we wanted to ask: are you currently experiencing any life circumstances that are negatively impacting your health and/or quality of life? Patient declined to answer    Social Determinants of Health     Financial Resource Strain: Low Risk  (12/12/2019)    Received from [facility], [facility]    Overall Financial Resource Strain (CARDIA)     Difficulty of Paying Living Expenses: Not hard at all   Internet Connectivity: Not on file   Food Insecurity: No Food Insecurity (11/10/2021)    Received from [facility], [facility]    Hunger Vital Sign     Worried About Running Out of Food in the Last Year: Never true     Ran Out of Food in the Last Year: Never true   Tobacco Use: High Risk (07/02/2022)    Patient History     Smoking Tobacco Use: Every Day     Smokeless Tobacco Use: Never     Passive Exposure: Not on file   Housing/Utilities: Not on file   Alcohol Use: Not At Risk (06/03/2019)    Alcohol Use     How often do you have a drink containing alcohol?: Never     How many drinks containing alcohol do you have on a typical day when you are drinking?: 1 - 2     How often do you have 5 or more drinks on one occasion?: Never   Transportation Needs: No Transportation Needs (11/10/2021)    Received from [facility], [facility]    [facility] - Transportation  Lack of Transportation (Medical): No     Lack of Transportation (Non-Medical): No   Substance Use: Not on file   Health Literacy: Not on file   Physical Activity: Inactive (12/12/2019)    Received from [facility], [facility]    Exercise Vital Sign     Days of Exercise per Week: 0 days     Minutes of Exercise per Session: 0 min   Interpersonal Safety: Not on file   Stress: Stress Concern Present (12/12/2019)    Received from [facility], [facility]    [facility] - Occupational Stress Questionnaire     Feeling of Stress : Rather much   Intimate Partner Violence: Not At Risk (11/10/2021)    Received from [facility], [facility]    Humiliation, Afraid, Rape, and Kick questionnaire     Fear of Current or Ex-Partner: No     Emotionally Abused: No     Physically Abused: No     Sexually Abused: No   Depression: Not at risk (10/05/2021)    PHQ-2     PHQ-2 Score: 0   Social Connections: Moderately Isolated (12/12/2019)    Received from [facility], [facility]    Social Connection and Isolation Panel [NHANES]     Frequency of Communication with Friends and Family: More than three times a week     Frequency of Social Gatherings with Friends and Family: Three times a week     Attends Religious Services: Never     Active Member of Clubs or Organizations: Yes     Attends Banker Meetings: Never     Marital Status: Never married       Would you be willing to receive help with any of the needs that you have identified today? Not applicable       SHIPPING     Specialty Medication(s) to be Shipped:   Inflammatory Disorders: Cosentyx    Other medication(s) to be shipped: No additional medications requested for fill at this time     Changes to insurance: No    Delivery Scheduled: Yes, Expected medication delivery date: 07/09/2022. Medication will be delivered via UPS to the confirmed prescription address in [facility].    The patient will receive a drug information handout for each medication shipped and additional FDA Medication Guides as required.  Verified that patient has previously received a Conservation officer, historic buildings and a Surveyor, mining.    The patient or caregiver noted above participated in the development of this care plan and knows that they can request review of or adjustments to the care plan at any time.      All of the patient's questions and concerns have been addressed.    Elnora Morrison, PharmD   [facility] Specialty Pharmacist

## 2022-07-06 LAB — TB AG2: TB AG2 VALUE: 0.01

## 2022-07-06 LAB — TB AG1: TB AG1 VALUE: 0.01

## 2022-07-06 LAB — QUANTIFERON TB GOLD PLUS
QUANTIFERON ANTIGEN 1 MINUS NIL: 0 [IU]/mL
QUANTIFERON ANTIGEN 2 MINUS NIL: 0 [IU]/mL
QUANTIFERON MITOGEN: 9.99 [IU]/mL
QUANTIFERON TB GOLD PLUS: NEGATIVE
QUANTIFERON TB NIL VALUE: 0.01 [IU]/mL

## 2022-07-06 LAB — TB NIL: TB NIL VALUE: 0.01

## 2022-07-06 LAB — TB MITOGEN: TB MITOGEN VALUE: 10

## 2022-07-07 NOTE — Unmapped (Signed)
TB test negative. Patient updated.

## 2022-07-08 MED FILL — COSENTYX PEN 300 MG/2 PENS (150 MG/ML) SUBCUTANEOUS: SUBCUTANEOUS | 28 days supply | Qty: 2 | Fill #3

## 2022-07-29 NOTE — Unmapped (Signed)
[facility] Refill Coordination Note    Specialty Medication(s) to be Shipped:   Inflammatory Disorders: Cosentyx    Other medication(s) to be shipped: No additional medications requested for fill at this time     Morgan Edwards, DOB: [birth year]  Phone: [phone] (home)       All above HIPAA information was verified with patient.     Was a Nurse, learning disability used for this call? No    Completed refill call assessment today to schedule patient's medication shipment from the [facility] [phone]).  All relevant notes have been reviewed.     Specialty medication(s) and dose(s) confirmed: Regimen is correct and unchanged.   Changes to medications: Tere reports no changes at this time.  Changes to insurance: No  New side effects reported not previously addressed with a pharmacist or physician: None reported  Questions for the pharmacist: No    Confirmed patient received a Conservation officer, historic buildings and a Surveyor, mining with first shipment. The patient will receive a drug information handout for each medication shipped and additional FDA Medication Guides as required.       DISEASE/MEDICATION-SPECIFIC INFORMATION        For patients on injectable medications: Patient currently has 0 doses left.  Next injection is scheduled for 08/06/2022.    SPECIALTY MEDICATION ADHERENCE              Were doses missed due to medication being on hold? No    Cosentyx 150 mg/ml: 0 days of medicine on hand       REFERRAL TO PHARMACIST     Referral to the pharmacist: Not needed      [facility]     Shipping address confirmed in Epic.       Delivery Scheduled: Yes, Expected medication delivery date: 08/01/2022.     Medication will be delivered via UPS to the prescription address in Epic [facility].    Elnora Morrison, PharmD   [facility] Specialty Pharmacist

## 2022-07-31 ENCOUNTER — Ambulatory Visit: Admit: 2022-07-31 | Discharge: 2022-07-31 | Payer: BLUE CROSS/BLUE SHIELD

## 2022-07-31 DIAGNOSIS — N879 Dysplasia of cervix uteri, unspecified: Secondary | ICD-10-CM | POA: Diagnosis not present

## 2022-07-31 MED FILL — COSENTYX PEN 300 MG/2 PENS (150 MG/ML) SUBCUTANEOUS: SUBCUTANEOUS | 28 days supply | Qty: 2 | Fill #4

## 2022-07-31 NOTE — Unmapped (Signed)
Brief Pre-operative History & Physical    Patient name: Morgan Edwards  CSN: [id number]  MRN: [id number]  Admit Date: 07/31/2022  Date of Surgery: 07/31/2022  Performing Service: [facility] Primary Gynecology    Code Status: Full Code      Assessment/Plan:      Morgan Edwards is a 47 y.o. 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     Consent obtained in office is accurate. Risks, benefits, and alternatives to surgery were reviewed, and all questions were answered.    Proceed to the OR as planned.         History of Present Illness:    Morgan Edwards is a 47 y.o. 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 She was recently seen in clinic, where a detailed HPI can be found. She was noted to benefit from:  Procedure(s) (LRB):  COLPOSCOPY OF THE CERVIX INCLUDING UPPER/ADJACENT VAGINA; WITH LOOP ELECTRODE BIOPSY(S) OF THE CERVIX (N/A).       Allergies  Atorvastatin, Clindamycin, and Erythromycin    Medications    Current Facility-Administered Medications   Medication Dose Route Frequency Provider Last Rate Last Admin    lactated Ringers infusion  10 mL/hr Intravenous Continuous Lamar Benes, MD           Vital Signs  BP 155/94  - Pulse 96  - Temp 36.3 ??C (97.3 ??F) (Temporal)  - Resp 11  - Ht 170.2 cm (5' 7)  - Wt (!) 136.2 kg (300 lb 3.2 oz)  - SpO2 99%  - BMI 47.02 kg/m??   Facility age limit for growth %iles is 20 years.  Facility age limit for growth %iles is 20 years..     Physical Exam  General: Well developed, appears stated age, in no acute distress  Mental status: Alert and oriented x3  Cardiovascular: Normal  Pulmonary: Symmetric chest rise, unlabored breathing  Relevant System for Surgery: Surgical site examination deferred to the OR    Labs and Studies:  Lab Results   Component Value Date    WBC 9.8 05/22/2021    HGB 13.2 05/22/2021    HCT 39.2 05/22/2021    PLT 365 05/22/2021       No results found for: PT, INR, APTT

## 2022-08-18 ENCOUNTER — Encounter: Payer: Self-pay | Admitting: Family

## 2022-08-18 ENCOUNTER — Ambulatory Visit: Payer: Medicaid Other | Admitting: Family

## 2022-08-21 NOTE — Unmapped (Signed)
[facility] Refill Coordination Note    Specialty Medication(s) to be Shipped:   Inflammatory Disorders: Cosentyx    Other medication(s) to be shipped: No additional medications requested for fill at this time     Morgan Edwards, DOB: [birth year]  Phone: [phone] (home)       All above HIPAA information was verified with patient.     Was a Nurse, learning disability used for this call? No    Completed refill call assessment today to schedule patient's medication shipment from the [facility] [phone]).  All relevant notes have been reviewed.     Specialty medication(s) and dose(s) confirmed: Regimen is correct and unchanged.   Changes to medications: Marva reports no changes at this time.  Changes to insurance: No  New side effects reported not previously addressed with a pharmacist or physician: None reported  Questions for the pharmacist: No    Confirmed patient received a Conservation officer, historic buildings and a Surveyor, mining with first shipment. The patient will receive a drug information handout for each medication shipped and additional FDA Medication Guides as required.       DISEASE/MEDICATION-SPECIFIC INFORMATION        For patients on injectable medications: Patient currently has 0 doses left.  Next injection is scheduled for 09/03/2022. Patient states she took last dose on 7/24    SPECIALTY MEDICATION ADHERENCE     Medication Adherence    Patient reported X missed doses in the last month: 0  Specialty Medication: secukinumab (COSENTYX PEN, 2 PENS,) 150 mg/mL PnIj injection  Patient is on additional specialty medications: No  Informant: patient              Were doses missed due to medication being on hold? No    Cosentyx 150 mg/ml: 0 days of medicine on hand       REFERRAL TO PHARMACIST     Referral to the pharmacist: Not needed      [facility]     Shipping address confirmed in Epic.       Delivery Scheduled: Yes, Expected medication delivery date: 08/28/2022.     Medication will be delivered via UPS to the prescription address in Epic WAM.    [name]   [facility] Specialty Technician

## 2022-08-27 MED FILL — COSENTYX PEN 300 MG/2 PENS (150 MG/ML) SUBCUTANEOUS: SUBCUTANEOUS | 28 days supply | Qty: 2 | Fill #5

## 2022-09-16 ENCOUNTER — Other Ambulatory Visit: Payer: Self-pay | Admitting: Family

## 2022-09-16 DIAGNOSIS — E1159 Type 2 diabetes mellitus with other circulatory complications: Secondary | ICD-10-CM

## 2022-09-16 DIAGNOSIS — I152 Hypertension secondary to endocrine disorders: Secondary | ICD-10-CM

## 2022-09-22 DIAGNOSIS — L409 Psoriasis, unspecified: Principal | ICD-10-CM

## 2022-09-22 MED ORDER — EMPTY CONTAINER
0 refills | 0 days
Start: 2022-09-22 — End: ?

## 2022-09-22 MED ORDER — COSENTYX PEN 300 MG/2 PENS (150 MG/ML) SUBCUTANEOUS
SUBCUTANEOUS | 5 refills | 28 days
Start: 2022-09-22 — End: ?

## 2022-09-22 NOTE — Unmapped (Signed)
[facility] Refill Coordination Note    Specialty Medication(s) to be Shipped:   Inflammatory Disorders: Cosentyx    Other medication(s) to be shipped:  Sharps container     Morgan Edwards, DOB: [birth year]  Phone: [phone] (home)       All above HIPAA information was verified with patient.     Was a Nurse, learning disability used for this call? No    Completed refill call assessment today to schedule patient's medication shipment from the [facility] [phone]).  All relevant notes have been reviewed.     Specialty medication(s) and dose(s) confirmed: Regimen is correct and unchanged.   Changes to medications: Arnell reports no changes at this time.  Changes to insurance: No  New side effects reported not previously addressed with a pharmacist or physician: None reported  Questions for the pharmacist: No    Confirmed patient received a Conservation officer, historic buildings and a Surveyor, mining with first shipment. The patient will receive a drug information handout for each medication shipped and additional FDA Medication Guides as required.       DISEASE/MEDICATION-SPECIFIC INFORMATION        For patients on injectable medications: Patient currently has 0 doses left.  Next injection is scheduled for 9/19.    SPECIALTY MEDICATION ADHERENCE     Medication Adherence    Patient reported X missed doses in the last month: 0  Specialty Medication: secukinumab (COSENTYX PEN, 2 PENS,) 150 mg/mL PnIj injection  Patient is on additional specialty medications: No              Were doses missed due to medication being on hold? No    Cosentyx 150 mg/ml: 0 doses of medicine on hand        REFERRAL TO PHARMACIST     Referral to the pharmacist: Not needed      [facility]     Shipping address confirmed in Epic.       Delivery Scheduled: Yes, Expected medication delivery date: 09/30/22.  However, Rx request for refills was sent to the provider as there are none remaining.     Medication will be delivered via UPS to the prescription address in Epic WAM.    Willette Pa   [facility] Specialty Technician

## 2022-09-22 NOTE — Unmapped (Signed)
Refill for Cosentyx

## 2022-09-23 MED ORDER — COSENTYX PEN 300 MG/2 PENS (150 MG/ML) SUBCUTANEOUS
SUBCUTANEOUS | 5 refills | 28.00000 days | Status: CP
Start: 2022-09-23 — End: ?
  Filled 2022-09-29: qty 2, 28d supply, fill #0

## 2022-09-29 MED FILL — EMPTY CONTAINER: 120 days supply | Qty: 1 | Fill #0

## 2022-10-02 ENCOUNTER — Ambulatory Visit (INDEPENDENT_AMBULATORY_CARE_PROVIDER_SITE_OTHER): Payer: Medicaid Other

## 2022-10-02 DIAGNOSIS — E1142 Type 2 diabetes mellitus with diabetic polyneuropathy: Secondary | ICD-10-CM | POA: Diagnosis not present

## 2022-10-02 LAB — HM DIABETES EYE EXAM

## 2022-10-02 NOTE — Progress Notes (Signed)
Jasmine Peck arrived 10/02/2022 and has given verbal consent to obtain images and complete their overdue diabetic retinal screening.  The images have been sent to an ophthalmologist or optometrist for review and interpretation.  Results will be sent back to Junie Spencer, FNP for review.  Patient has been informed they will be contacted when we receive the results via telephone or MyChart

## 2022-10-15 ENCOUNTER — Telehealth: Payer: Self-pay | Admitting: Family

## 2022-10-15 NOTE — Telephone Encounter (Signed)
Patient wants a letter written to support for her to get disability for mental and physical reasons.

## 2022-10-16 ENCOUNTER — Ambulatory Visit: Payer: Medicaid Other | Admitting: Family

## 2022-10-16 ENCOUNTER — Other Ambulatory Visit: Payer: Self-pay | Admitting: Family

## 2022-10-16 ENCOUNTER — Ambulatory Visit (INDEPENDENT_AMBULATORY_CARE_PROVIDER_SITE_OTHER): Payer: Medicaid Other

## 2022-10-16 ENCOUNTER — Encounter: Payer: Self-pay | Admitting: Family

## 2022-10-16 VITALS — BP 137/81 | HR 84 | Temp 97.7°F | Ht 68.0 in | Wt 305.5 lb

## 2022-10-16 DIAGNOSIS — M542 Cervicalgia: Secondary | ICD-10-CM

## 2022-10-16 DIAGNOSIS — F411 Generalized anxiety disorder: Secondary | ICD-10-CM | POA: Diagnosis not present

## 2022-10-16 DIAGNOSIS — Z87828 Personal history of other (healed) physical injury and trauma: Secondary | ICD-10-CM

## 2022-10-16 DIAGNOSIS — D3130 Benign neoplasm of unspecified choroid: Secondary | ICD-10-CM

## 2022-10-16 DIAGNOSIS — M5412 Radiculopathy, cervical region: Secondary | ICD-10-CM | POA: Diagnosis not present

## 2022-10-16 MED ORDER — BACLOFEN 10 MG PO TABS
10.0000 mg | ORAL_TABLET | Freq: Three times a day (TID) | ORAL | 0 refills | Status: DC
Start: 2022-10-16 — End: 2023-01-26

## 2022-10-16 MED ORDER — CELECOXIB 200 MG PO CAPS
200.0000 mg | ORAL_CAPSULE | Freq: Two times a day (BID) | ORAL | 1 refills | Status: DC
Start: 2022-10-16 — End: 2023-01-26
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
## 2022-10-16 NOTE — Telephone Encounter (Signed)
Pt seen today.   Jannifer Rodney, FNP

## 2022-10-16 NOTE — Patient Instructions (Signed)
Cervical Radiculopathy  Cervical radiculopathy happens when a nerve in the neck (a cervical nerve) is pinched or bruised. This condition can happen because of an injury to the cervical spine (vertebrae) in the neck, or as part of the normal aging process. Pressure on the cervical nerves can cause pain or numbness that travels from the neck all the way down to the arm and fingers. This condition usually gets better with rest. Treatment may be needed if the condition does not improve. What are the causes? This condition may be caused by: A neck injury. A bulging (herniated) disk. Muscle spasms. Muscle tightness in the neck due to overuse. Arthritis. Breakdown or degeneration in the bones and joints of the spine (spondylosis) due to aging. Bone spurs that may develop near the cervical nerves. What are the signs or symptoms? Symptoms of this condition include: Pain. The pain may travel from the neck to the arm and hand. The pain can be severe or irritating. It may get worse when you move your neck. Numbness or tingling in your arm or hand. Weakness in the affected arm and hand, in severe cases. How is this diagnosed? This condition may be diagnosed based on your symptoms, your medical history, and a physical exam. You may also have tests, including: X-rays. CT scan. MRI. Electromyogram (EMG). Nerve conduction tests. How is this treated? In many cases, treatment is not needed for this condition. With rest, the condition usually gets better over time. If treatment is needed, options may include: Wearing a soft neck collar (cervical collar) for short periods of time. Doing physical therapy to strengthen your neck muscles. Taking medicines. These may include NSAIDs, such as ibuprofen, or oral corticosteroids. Having spinal injections, in severe cases. Having surgery. This may be needed if other treatments do not help. Different types of surgery may be done depending on the cause of this  condition. Follow these instructions at home: If you have a cervical collar: Wear it as told by your health care provider. Remove it only as told by your health care provider. Ask your health care provider if you can remove the cervical collar for cleaning and bathing. If you are allowed to remove the collar for cleaning or bathing: Follow instructions from your health care provider about how to remove the collar safely. Clean the collar by wiping it with mild soap and water and drying it completely. Take out any removable pads in the collar every 1-2 days, and wash them by hand with soap and water. Let them air-dry completely before you put them back in the collar. Check your skin under the collar for irritation or sores. If you see any, tell your health care provider. Managing pain     Take over-the-counter and prescription medicines only as told by your health care provider. If directed, put ice on the affected area. To do this: If you have a soft neck collar, remove it as told by your health care provider. Put ice in a plastic bag. Place a towel between your skin and the bag. Leave the ice on for 20 minutes, 2-3 times a day. Remove the ice if your skin turns bright red. This is very important. If you cannot feel pain, heat, or cold, you have a greater risk of damage to the area. If applying ice does not help, you can try using heat. Use the heat source that your health care provider recommends, such as a moist heat pack or a heating pad. Place a towel between   your skin and the heat source. Leave the heat on for 20-30 minutes. Remove the heat if your skin turns bright red. This is especially important if you are unable to feel pain, heat, or cold. You have a greater risk of getting burned. Try a gentle neck and shoulder massage to help relieve symptoms. Activity Rest as needed. Return to your normal activities as told by your health care provider. Ask your health care provider what  activities are safe for you. Do stretching and strengthening exercises as told by your health care provider or your physical therapist. You may have to avoid lifting. Ask your health care provider how much you can safely lift. General instructions Use a flat pillow when you sleep. Do not drive while wearing a cervical collar. If you do not have a cervical collar, ask your health care provider if it is safe to drive while your neck heals. Ask your health care provider if the medicine prescribed to you requires you to avoid driving or using machinery. Do not use any products that contain nicotine or tobacco. These products include cigarettes, chewing tobacco, and vaping devices, such as e-cigarettes. If you need help quitting, ask your health care provider. Keep all follow-up visits. This is important. Contact a health care provider if: Your condition does not improve with treatment. Get help right away if: Your pain gets much worse and is not controlled with medicines. You have weakness or numbness in your hand, arm, face, or leg. You have a high fever. You have a stiff, rigid neck. You lose control of your bowels or your bladder (have incontinence). You have trouble with walking, balance, or speaking. Summary Cervical radiculopathy happens when a nerve in the neck is pinched or bruised. A nerve can get pinched from a bulging disk, arthritis, muscle spasms, or an injury to the neck. Symptoms include pain, tingling, or numbness radiating from the neck to the arm or hand. Weakness can also occur in severe cases. Treatment may include rest, wearing a cervical collar, and physical therapy. Medicines may be prescribed to help with pain. In severe cases, injections or surgery may be needed. This information is not intended to replace advice given to you by your health care provider. Make sure you discuss any questions you have with your health care provider. Document Revised: 07/05/2020 Document  Reviewed: 07/05/2020 Elsevier Patient Education  2024 [employer] Inc[employer]  

## 2022-10-22 NOTE — Unmapped (Signed)
[facility] Refill Coordination Note    Specialty Medication(s) to be Shipped:   Inflammatory Disorders: Cosentyx    Other medication(s) to be shipped: No additional medications requested for fill at this time     Morgan Edwards, DOB: 28-Dec-1975  Phone: [phone] (home)       All above HIPAA information was verified with patient.     Was a Nurse, learning disability used for this call? No    Completed refill call assessment today to schedule patient's medication shipment from the [facility] [phone]).  All relevant notes have been reviewed.     Specialty medication(s) and dose(s) confirmed: Regimen is correct and unchanged.   Changes to medications: Morgan Edwards reports no changes at this time.  Changes to insurance: No  New side effects reported not previously addressed with a pharmacist or physician: None reported  Questions for the pharmacist: No    Confirmed patient received a Conservation officer, historic buildings and a Surveyor, mining with first shipment. The patient will receive a drug information handout for each medication shipped and additional FDA Medication Guides as required.       DISEASE/MEDICATION-SPECIFIC INFORMATION        For patients on injectable medications: Patient currently has 0 doses left.  Next injection is scheduled for 10/17.    SPECIALTY MEDICATION ADHERENCE     Medication Adherence    Patient reported X missed doses in the last month: 0  Specialty Medication: secukinumab (COSENTYX PEN, 2 PENS,) 150 mg/mL PnIj injection  Patient is on additional specialty medications: No  Informant: patient              Were doses missed due to medication being on hold? No    Cosentyx 150 mg/ml: 0 doses of medicine on hand        REFERRAL TO PHARMACIST     Referral to the pharmacist: Not needed      [facility]     Shipping address confirmed in Epic.       Delivery Scheduled: Yes, Expected medication delivery date: 10/14.     Medication will be delivered via UPS to the prescription address in Epic WAM.    Morgan Edwards   [facility] Specialty Technician

## 2022-10-27 MED FILL — COSENTYX PEN 300 MG/2 PENS (150 MG/ML) SUBCUTANEOUS: SUBCUTANEOUS | 28 days supply | Qty: 2 | Fill #1

## 2022-10-29 ENCOUNTER — Other Ambulatory Visit: Payer: Self-pay | Admitting: Family

## 2022-10-29 DIAGNOSIS — Z1231 Encounter for screening mammogram for malignant neoplasm of breast: Secondary | ICD-10-CM

## 2022-10-30 ENCOUNTER — Other Ambulatory Visit: Payer: Self-pay | Admitting: *Deleted

## 2022-10-30 ENCOUNTER — Inpatient Hospital Stay: Payer: Medicaid Other | Attending: Hematology

## 2022-10-30 ENCOUNTER — Ambulatory Visit (HOSPITAL_COMMUNITY)
Admission: RE | Admit: 2022-10-30 | Discharge: 2022-10-30 | Disposition: A | Discharge status: Home / Self Care | Payer: Medicaid Other | Source: Ambulatory Visit | Attending: Physician Assistant | Admitting: Physician Assistant

## 2022-10-30 DIAGNOSIS — F17210 Nicotine dependence, cigarettes, uncomplicated: Secondary | ICD-10-CM | POA: Diagnosis not present

## 2022-10-30 DIAGNOSIS — D472 Monoclonal gammopathy: Secondary | ICD-10-CM | POA: Insufficient documentation

## 2022-10-30 DIAGNOSIS — M47814 Spondylosis without myelopathy or radiculopathy, thoracic region: Secondary | ICD-10-CM | POA: Diagnosis not present

## 2022-10-30 DIAGNOSIS — Z79899 Other long term (current) drug therapy: Secondary | ICD-10-CM | POA: Diagnosis not present

## 2022-10-30 LAB — CBC WITH DIFFERENTIAL/PLATELET
Abs Immature Granulocytes: 0.02 10*3/uL (ref 0.00–0.07)
Basophils Absolute: 0.1 10*3/uL (ref 0.0–0.1)
Basophils Relative: 1 %
Eosinophils Absolute: 0.1 10*3/uL (ref 0.0–0.5)
Eosinophils Relative: 1 %
HCT: 41.7 % (ref 36.0–46.0)
Hemoglobin: 13.4 g/dL (ref 12.0–15.0)
Immature Granulocytes: 0 %
Lymphocytes Relative: 42 %
Lymphs Abs: 3.6 10*3/uL (ref 0.7–4.0)
MCH: 33.2 pg (ref 26.0–34.0)
MCHC: 32.1 g/dL (ref 30.0–36.0)
MCV: 103.2 fL — ABNORMAL HIGH (ref 80.0–100.0)
Monocytes Absolute: 0.5 10*3/uL (ref 0.1–1.0)
Monocytes Relative: 6 %
Neutro Abs: 4.3 10*3/uL (ref 1.7–7.7)
Neutrophils Relative %: 50 %
Platelets: 320 10*3/uL (ref 150–400)
RBC: 4.04 MIL/uL (ref 3.87–5.11)
RDW: 13 % (ref 11.5–15.5)
WBC: 8.6 10*3/uL (ref 4.0–10.5)
nRBC: 0 % (ref 0.0–0.2)

## 2022-10-30 LAB — COMPREHENSIVE METABOLIC PANEL
ALT: 13 U/L (ref 0–44)
AST: 12 U/L — ABNORMAL LOW (ref 15–41)
Albumin: 3.7 g/dL (ref 3.5–5.0)
Alkaline Phosphatase: 61 U/L (ref 38–126)
Anion gap: 9 (ref 5–15)
BUN: 17 mg/dL (ref 6–20)
CO2: 23 mmol/L (ref 22–32)
Calcium: 8.2 mg/dL — ABNORMAL LOW (ref 8.9–10.3)
Chloride: 104 mmol/L (ref 98–111)
Creatinine, Ser: 0.97 mg/dL (ref 0.44–1.00)
GFR, Estimated: >60 mL/min (ref >60)
Glucose, Bld: 127 mg/dL — ABNORMAL HIGH (ref 70–99)
Potassium: 3.9 mmol/L (ref 3.5–5.1)
Sodium: 136 mmol/L (ref 135–145)
Total Bilirubin: 0.6 mg/dL (ref 0.3–1.2)
Total Protein: 6.9 g/dL (ref 6.5–8.1)

## 2022-10-30 LAB — LACTATE DEHYDROGENASE: LDH: 113 U/L (ref 98–192)

## 2022-10-31 LAB — KAPPA/LAMBDA LIGHT CHAINS
Kappa free light chain: 20.8 mg/L — ABNORMAL HIGH (ref 3.3–19.4)
Kappa, lambda light chain ratio: 1.55 (ref 0.26–1.65)
Lambda free light chains: 13.4 mg/L (ref 5.7–26.3)

## 2022-11-02 LAB — PROTEIN ELECTROPHORESIS, SERUM
A/G Ratio: 1.3 (ref 0.7–1.7)
Albumin ELP: 3.7 g/dL (ref 2.9–4.4)
Alpha-1-Globulin: 0.2 g/dL (ref 0.0–0.4)
Alpha-2-Globulin: 0.8 g/dL (ref 0.4–1.0)
Beta Globulin: 0.9 g/dL (ref 0.7–1.3)
Gamma Globulin: 0.9 g/dL (ref 0.4–1.8)
Globulin, Total: 2.8 g/dL (ref 2.2–3.9)
M-Spike, %: 0.3 g/dL — ABNORMAL HIGH
Total Protein ELP: 6.5 g/dL (ref 6.0–8.5)

## 2022-11-04 ENCOUNTER — Encounter: Payer: Self-pay | Admitting: Family

## 2022-11-06 ENCOUNTER — Inpatient Hospital Stay: Payer: Medicaid Other | Admitting: Oncology

## 2022-11-06 ENCOUNTER — Encounter: Payer: Self-pay | Admitting: Oncology

## 2022-11-06 VITALS — BP 154/98 | HR 76 | Temp 98.0°F | Resp 18 | Wt 312.6 lb

## 2022-11-06 DIAGNOSIS — D472 Monoclonal gammopathy: Secondary | ICD-10-CM | POA: Diagnosis not present

## 2022-11-06 DIAGNOSIS — Z79899 Other long term (current) drug therapy: Secondary | ICD-10-CM | POA: Diagnosis not present

## 2022-11-06 DIAGNOSIS — F17210 Nicotine dependence, cigarettes, uncomplicated: Secondary | ICD-10-CM | POA: Diagnosis not present
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
## 2022-11-08 ENCOUNTER — Ambulatory Visit
Admit: 2022-11-08 | Discharge: 2022-11-08 | Disposition: A | Discharge status: Home / Self Care | Payer: BLUE CROSS/BLUE SHIELD

## 2022-11-08 DIAGNOSIS — M5431 Sciatica, right side: Principal | ICD-10-CM

## 2022-11-08 DIAGNOSIS — E669 Obesity, unspecified: Secondary | ICD-10-CM | POA: Diagnosis not present

## 2022-11-08 DIAGNOSIS — E119 Type 2 diabetes mellitus without complications: Secondary | ICD-10-CM | POA: Diagnosis not present

## 2022-11-08 DIAGNOSIS — M5441 Lumbago with sciatica, right side: Secondary | ICD-10-CM | POA: Diagnosis not present

## 2022-11-08 DIAGNOSIS — Z79899 Other long term (current) drug therapy: Secondary | ICD-10-CM | POA: Diagnosis not present

## 2022-11-08 DIAGNOSIS — F17210 Nicotine dependence, cigarettes, uncomplicated: Secondary | ICD-10-CM | POA: Diagnosis not present

## 2022-11-08 DIAGNOSIS — Z21 Asymptomatic human immunodeficiency virus [HIV] infection status: Secondary | ICD-10-CM | POA: Diagnosis not present

## 2022-11-08 DIAGNOSIS — I10 Essential (primary) hypertension: Secondary | ICD-10-CM | POA: Diagnosis not present

## 2022-11-08 DIAGNOSIS — Z6841 Body Mass Index (BMI) 40.0 and over, adult: Secondary | ICD-10-CM | POA: Diagnosis not present

## 2022-11-08 MED ORDER — NAPROXEN 500 MG TABLET
ORAL_TABLET | Freq: Two times a day (BID) | ORAL | 0 refills | 10 days | Status: CP | PRN
Start: 2022-11-08 — End: ?

## 2022-11-08 MED ORDER — METHOCARBAMOL 500 MG TABLET
ORAL_TABLET | Freq: Two times a day (BID) | ORAL | 0 refills | 10 days | Status: CP
Start: 2022-11-08 — End: 2022-11-18

## 2022-11-08 MED ORDER — TRAMADOL 50 MG TABLET
ORAL_TABLET | Freq: Four times a day (QID) | ORAL | 0 refills | 3 days | Status: CP | PRN
Start: 2022-11-08 — End: 2022-11-13

## 2022-11-08 MED ADMIN — ketorolac (TORADOL) injection 30 mg: 30 mg | INTRAMUSCULAR | @ 11:00:00 | Stop: 2022-11-08

## 2022-11-08 MED ADMIN — traMADol (ULTRAM) tablet 50 mg: 50 mg | ORAL | @ 11:00:00 | Stop: 2022-11-08

## 2022-11-08 MED ADMIN — methocarbamol (ROBAXIN) tablet 500 mg: 500 mg | ORAL | @ 11:00:00 | Stop: 2022-11-08

## 2022-11-08 MED ADMIN — dexAMETHasone (DECADRON) injection 10 mg: 10 mg | INTRAMUSCULAR | @ 11:00:00 | Stop: 2022-11-08

## 2022-11-08 NOTE — Unmapped (Signed)
Emergency Department Provider Note        ED Clinical Impression     Final diagnoses:   Sciatica of right side (Primary)       ED Assessment/Plan       Condition: Stable  Disposition: Discharge    This chart has been completed using Dragon Medical Dictation software, and while attempts have been made to ensure accuracy, certain words and phrases may not be transcribed as intended.     History     Chief Complaint   Patient presents with    Back Pain     HPI    Morgan Edwards is a 47 y.o. female  who presents today to the  emergency department complaining of right-sided lower back pain radiating to the right leg.  Patient says it feels it is more sciatica.  Points to the buttock area.  Pain is worse with standing for long periods of time.  She denies any trauma.  She denies any bowel or bladder incontinence.  States symptoms as moderate.        Allergies: is allergic to atorvastatin, clindamycin, and erythromycin.  Medications: has a current medication list which includes the following long-term medication(s): albuterol and gabapentin.  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), Depression, recurrent (CMS-HCC) (12/06/2012), Diabetes mellitus, type 2 (CMS-HCC) (11/23/2012), Essential hypertension (11/23/2012), GAD (generalized anxiety disorder) (04/04/2014), GERD (gastroesophageal reflux disease) (since 2010), HIV disease (CMS-HCC) (2014), MRSA (methicillin resistant Staphylococcus aureus), Obesity, morbid with BMI of 45.0-49.9, adult (11/23/2012), Obstructive sleep apnea (02/01/2013), Panic attack as reaction to stress (11/03/2013), Pneumonia (10/2021), Psoriasis, PTSD w/ depression (12/06/2012), Restless leg syndrome (02/01/2013), Tobacco use (11/23/2012), Tonsil and adenoid disease, chronic, and Vitamin D deficiency (04/05/2014).  PSHx:  has a past surgical history that includes Cholecystectomy (2000); Dilation and curettage of uterus (0981,1914); Abscess drainage; Dental surgery; and full dent restor:may incl oral exm;dent xrays;prophy/fl tx;dent restor;pulp tx;dent extr;dent ap (Bilateral, 11/27/2014).  SocHx:  reports that she has been smoking cigarettes. She has a 12.5 pack-year smoking history. She has never used smokeless tobacco. She reports that she does not drink alcohol and does not use drugs.  Allergies, Medications, Medical, Surgical, and Social History were reviewed as documented above.      Social Determinants of Health with Concerns     Internet Connectivity: Not on file   Housing/Utilities: Not on file   Tobacco Use: High Risk (11/08/2022)    Patient History     Smoking Tobacco Use: Every Day     Smokeless Tobacco Use: Never     Passive Exposure: Not on file   Interpersonal Safety: Unknown (11/08/2022)    Interpersonal Safety     Unsafe Where You Currently Live: Not on file     Physically Hurt by Anyone: Not on file     Abused by Anyone: Not on file   Physical Activity: Inactive (12/12/2019)    Received from [facility], [facility]    Exercise Vital Sign     Days of Exercise per Week: 0 days     Minutes of Exercise per Session: 0 min   Stress: Stress Concern Present (12/12/2019)    Received from [facility]    [facility] of Occupational Health - Occupational Stress Questionnaire     Feeling of Stress : Rather much   Substance Use: Not on file   Social Connections: Moderately Isolated (12/12/2019)    Received [facility], [facility]  Social Connection and Isolation Panel [NHANES]     Frequency of Communication with Friends and Family: More than three times a week     Frequency of Social Gatherings with Friends and Family: Three times a week     Attends Religious Services: Never     Active Member of Clubs or Organizations: Yes     Attends Banker Meetings: Never     Marital Status: Never married   Health Literacy: Not on file         Review Of Systems    Review of Systems   Constitutional:  Negative for fever.   HENT:  Negative for congestion.    Respiratory:  Negative for chest tightness and shortness of breath.    Cardiovascular:  Negative for chest pain.   Gastrointestinal:  Negative for abdominal pain.   Musculoskeletal:         Right-sided back pain.   Skin:  Negative for color change.   Psychiatric/Behavioral:  Negative for behavioral problems.    All other systems reviewed and are negative.      Physical Exam     BP 152/105  - Pulse 90  - Temp 36.7 ??C (98.1 ??F) (Oral)  - Resp 16  - Ht 175.3 cm (5' 9)  - Wt (!) 136.1 kg (300 lb)  - SpO2 97%  - BMI 44.30 kg/m??     Physical Exam  Vitals and nursing note reviewed.   Constitutional:       General: She is not in acute distress.     Appearance: She is obese.   HENT:      Head: Normocephalic.   Eyes:      Conjunctiva/sclera: Conjunctivae normal.   Cardiovascular:      Rate and Rhythm: Regular rhythm.      Pulses: Normal pulses.      Heart sounds: Normal heart sounds.   Pulmonary:      Effort: No respiratory distress.      Breath sounds: Normal breath sounds.   Abdominal:      General: There is no distension.   Musculoskeletal:         General: Tenderness present. No deformity.      Comments: There is right lower back tenderness over the sciatic nerve.  There is no midline tenderness or step-offs.  There is pain with straight leg raise of the right leg at about 30 degrees.  No saddle anesthesia.   Skin:     General: Skin is warm.      Capillary Refill: Capillary refill takes 2 to 3 seconds.      Comments: Normal cap refill.   Neurological:      General: No focal deficit present.   Psychiatric:         Mood and Affect: Mood normal.         ED Course   Medical Decision Making  Clinical picture is consistent with sciatica.  Will treat patient symptomatically.    7:44 AM  Patient is doing well.  Feels better.  She is stable for discharge.    I have reviewed my clinical findings and studies and my clinical impression with the patient. The patient has expressed understanding that at this time there is no evidence for a more malignant underlying process, but the patient also understands that early in the process of a condition such as this, an initial workup can be falsely reassuring. I have counseled the patient and discussed follow-up with the patient,  stressing the importance of appropriate follow-up. I have also counseled the patient to return if worse or any concerns. Routine discharge counseling was given to the patient and the patient understands that worsening, changing or persistent symptoms should prompt an immediate call or follow up with their primary physician or return to the emergency department for reevaluation. Patient has expressed understanding.          Problems Addressed:  Sciatica of right side: acute illness or injury that poses a threat to life or bodily functions    Risk  Prescription drug management.  Decision regarding hospitalization.         Procedures     No results found for this visit on 11/08/22 (from the past 4464 hour(s)).      ED Results  No results found for any visits on 11/08/22.  No results found.    Medications Administered:   Medications   ketorolac (TORADOL) injection 30 mg (30 mg Intramuscular Given 11/08/22 0654)   dexAMETHasone (DECADRON) injection 10 mg (10 mg Intramuscular Given 11/08/22 0653)   methocarbamol (ROBAXIN) tablet 500 mg (500 mg Oral Given 11/08/22 0654)   traMADol (ULTRAM) tablet 50 mg (50 mg Oral Given 11/08/22 2725)       Discharge Medications (Medications Prescribed during this  ED visit and Patient's Home Medications) :      Your Medication List        START taking these medications      methocarbamol 500 MG tablet  Commonly known as: ROBAXIN  Take 1 tablet (500 mg total) by mouth two (2) times a day for 10 days.     naproxen 500 MG tablet  Commonly known as: NAPROSYN  Take 1 tablet (500 mg total) by mouth two (2) times a day as needed (pain).     traMADol 50 mg tablet  Commonly known as: ULTRAM  Take 1 tablet (50 mg total) by mouth every six (6) hours as needed for pain for up to 5 days.            ASK your doctor about these medications      albuterol 90 mcg/actuation inhaler  Commonly known as: PROVENTIL HFA;VENTOLIN HFA  Inhale 2 puffs every six (6) hours as needed.     amlodipine 10 MG tablet  Commonly known as: NORVASC  Take 1 tablet (10 mg total) by mouth daily.     cetirizine 10 MG tablet  Commonly known as: ZYRTEC  Take 1 tablet (10 mg total) by mouth daily.     clobetasol 0.05 % ointment  Commonly known as: TEMOVATE  Apply topically two (2) times a day as needed. Use on stubborn areas (feet, elbows, etc) until smooth.     COSENTYX PEN 150 mg/mL Pnij injection  Generic drug: secukinumab  Inject under the skin.     COSENTYX PEN (2 PENS) 150 mg/mL Pnij injection  Generic drug: secukinumab  Inject the contents of 2 pens (300 mg total) under the skin every twenty-eight (28) days. Maintenance dose.     DOVATO 50-300 mg Tab  Generic drug: dolutegravir-lamiVUDine  Take 1 tablet by mouth daily.     empty container Misc  use as directed to dispose of Cosentyx     empty container Misc  Use as directed     fluticasone propionate 50 mcg/actuation nasal spray  Commonly known as: FLONASE  2 sprays into each nostril daily as needed.     gabapentin 600 MG tablet  Commonly known as: NEURONTIN  Take 1 tablet (600 mg total) by mouth two (2) times a day.     lisinopril 20 MG tablet  Commonly known as: PRINIVIL,ZESTRIL  Take 1 tablet (20 mg total) by mouth daily.     MOUNJARO 5 mg/0.5 mL Pnij  Generic drug: tirzepatide  Inject 5 mg under the skin.     omeprazole 20 MG capsule  Commonly known as: PriLOSEC  Take 1 capsule (20 mg total) by mouth daily.     urea 40 % Foam  Apply daily to feet after baths or showers.     urea 40 % Lotn  Apply at night as needed for dryness                 Bernerd Pho, MD  11/08/22 [phone]

## 2022-11-08 NOTE — Unmapped (Signed)
Patient here from home for evaluation of right hip pain that radiates down right leg x4 days ago. States that she thinks it is sciatica. Hx of same years ago.

## 2022-11-20 NOTE — Unmapped (Signed)
[facility] Refill Coordination Note    Specialty Medication(s) to be Shipped:   Inflammatory Disorders: Cosentyx    Other medication(s) to be shipped: No additional medications requested for fill at this time     Morgan Edwards, DOB: 1975-03-21  Phone: [phone] (home)       All above HIPAA information was verified with patient.     Was a Nurse, learning disability used for this call? No    Completed refill call assessment today to schedule patient's medication shipment from the [facility] [phone]).  All relevant notes have been reviewed.     Specialty medication(s) and dose(s) confirmed: Regimen is correct and unchanged.   Changes to medications: Nyjae reports no changes at this time.  Changes to insurance: No  New side effects reported not previously addressed with a pharmacist or physician: None reported  Questions for the pharmacist: No    Confirmed patient received a Conservation officer, historic buildings and a Surveyor, mining with first shipment. The patient will receive a drug information handout for each medication shipped and additional FDA Medication Guides as required.       DISEASE/MEDICATION-SPECIFIC INFORMATION        For patients on injectable medications: Patient currently has 0 doses left.  Next injection is scheduled for 11/14.    SPECIALTY MEDICATION ADHERENCE     Medication Adherence    Patient reported X missed doses in the last month: 0  Specialty Medication: secukinumab (COSENTYX PEN, 2 PENS,) 150 mg/mL PnIj injection  Patient is on additional specialty medications: No  Informant: patient              Were doses missed due to medication being on hold? No    Cosentyx 150 mg/ml: 0 doses of medicine on hand        REFERRAL TO PHARMACIST     Referral to the pharmacist: Not needed      [facility]     Shipping address confirmed in Epic.       Delivery Scheduled: Yes, Expected medication delivery date: 11/12.     Medication will be delivered via UPS to the prescription address in Epic WAM.    [name]   [facility] Specialty Technician

## 2022-11-24 MED FILL — COSENTYX PEN 300 MG/2 PENS (150 MG/ML) SUBCUTANEOUS: SUBCUTANEOUS | 28 days supply | Qty: 2 | Fill #2

## 2022-11-26 DIAGNOSIS — Z6841 Body Mass Index (BMI) 40.0 and over, adult: Secondary | ICD-10-CM | POA: Diagnosis not present

## 2022-11-26 DIAGNOSIS — H6692 Otitis media, unspecified, left ear: Secondary | ICD-10-CM | POA: Diagnosis not present

## 2022-11-26 DIAGNOSIS — R030 Elevated blood-pressure reading, without diagnosis of hypertension: Secondary | ICD-10-CM | POA: Diagnosis not present

## 2022-11-26 DIAGNOSIS — R0981 Nasal congestion: Secondary | ICD-10-CM | POA: Diagnosis not present

## 2022-12-02 ENCOUNTER — Other Ambulatory Visit: Payer: Self-pay | Admitting: Family

## 2022-12-02 DIAGNOSIS — I152 Hypertension secondary to endocrine disorders: Secondary | ICD-10-CM

## 2022-12-08 DIAGNOSIS — I10 Essential (primary) hypertension: Secondary | ICD-10-CM | POA: Diagnosis not present

## 2022-12-08 DIAGNOSIS — J0100 Acute maxillary sinusitis, unspecified: Secondary | ICD-10-CM | POA: Diagnosis not present

## 2022-12-08 DIAGNOSIS — Z6841 Body Mass Index (BMI) 40.0 and over, adult: Secondary | ICD-10-CM | POA: Diagnosis not present

## 2022-12-24 NOTE — Unmapped (Signed)
[facility] Refill Coordination Note    Specialty Medication(s) to be Shipped:   Inflammatory Disorders: Cosentyx    Other medication(s) to be shipped: No additional medications requested for fill at this time     Morgan Edwards, DOB: 26-Nov-1975  Phone: [phone] (home)       All above HIPAA information was verified with patient.     Was a Nurse, learning disability used for this call? No    Completed refill call assessment today to schedule patient's medication shipment from the [facility] [phone]).  All relevant notes have been reviewed.     Specialty medication(s) and dose(s) confirmed: Regimen is correct and unchanged.   Changes to medications: Morgan Edwards reports no changes at this time.  Changes to insurance: No  New side effects reported not previously addressed with a pharmacist or physician: None reported  Questions for the pharmacist: No    Confirmed patient received a Conservation officer, historic buildings and a Surveyor, mining with first shipment. The patient will receive a drug information handout for each medication shipped and additional FDA Medication Guides as required.       DISEASE/MEDICATION-SPECIFIC INFORMATION        For patients on injectable medications: Patient currently has 0 doses left.  Next injection is scheduled for 12/17.    SPECIALTY MEDICATION ADHERENCE     Medication Adherence    Patient reported X missed doses in the last month: 0  Specialty Medication: secukinumab (COSENTYX PEN, 2 PENS,) 150 mg/mL PnIj injection  Patient is on additional specialty medications: No  Informant: patient              Were doses missed due to medication being on hold? No    Cosentyx 150 mg/ml: 0 doses of medicine on hand        REFERRAL TO PHARMACIST     Referral to the pharmacist: Not needed      [facility]     Shipping address confirmed in Epic.       Delivery Scheduled: Yes, Expected medication delivery date: 12/13.     Medication will be delivered via UPS to the prescription address in Epic WAM.    [name]   [facility] Specialty Technician

## 2022-12-25 MED FILL — COSENTYX PEN 300 MG/2 PENS (150 MG/ML) SUBCUTANEOUS: SUBCUTANEOUS | 28 days supply | Qty: 2 | Fill #3

## 2022-12-29 NOTE — Unmapped (Signed)
Pt wishes to have LEEP under sedation. Transferred to [facility] for scheduling.

## 2023-01-02 ENCOUNTER — Other Ambulatory Visit: Payer: Self-pay | Admitting: Family

## 2023-01-02 DIAGNOSIS — E1159 Type 2 diabetes mellitus with other circulatory complications: Secondary | ICD-10-CM

## 2023-01-16 ENCOUNTER — Ambulatory Visit
Admission: RE | Admit: 2023-01-16 | Discharge: 2023-01-16 | Disposition: A | Discharge status: Home / Self Care | Payer: Medicaid Other | Source: Ambulatory Visit | Attending: Family | Admitting: Family

## 2023-01-16 DIAGNOSIS — Z1231 Encounter for screening mammogram for malignant neoplasm of breast: Secondary | ICD-10-CM

## 2023-01-26 ENCOUNTER — Other Ambulatory Visit: Payer: Self-pay | Admitting: Family

## 2023-01-26 ENCOUNTER — Ambulatory Visit: Payer: Medicaid Other | Admitting: Family

## 2023-01-26 ENCOUNTER — Encounter: Payer: Self-pay | Admitting: Family

## 2023-01-26 VITALS — BP 167/87 | HR 77 | Temp 97.2°F | Ht 69.0 in | Wt 314.6 lb

## 2023-01-26 DIAGNOSIS — F4310 Post-traumatic stress disorder, unspecified: Secondary | ICD-10-CM | POA: Diagnosis not present

## 2023-01-26 DIAGNOSIS — F331 Major depressive disorder, recurrent, moderate: Secondary | ICD-10-CM

## 2023-01-26 DIAGNOSIS — E785 Hyperlipidemia, unspecified: Secondary | ICD-10-CM | POA: Diagnosis not present

## 2023-01-26 DIAGNOSIS — E1142 Type 2 diabetes mellitus with diabetic polyneuropathy: Secondary | ICD-10-CM | POA: Diagnosis not present

## 2023-01-26 DIAGNOSIS — E1159 Type 2 diabetes mellitus with other circulatory complications: Secondary | ICD-10-CM

## 2023-01-26 DIAGNOSIS — B20 Human immunodeficiency virus [HIV] disease: Secondary | ICD-10-CM

## 2023-01-26 DIAGNOSIS — E1169 Type 2 diabetes mellitus with other specified complication: Secondary | ICD-10-CM

## 2023-01-26 DIAGNOSIS — Z6841 Body Mass Index (BMI) 40.0 and over, adult: Secondary | ICD-10-CM | POA: Diagnosis not present

## 2023-01-26 DIAGNOSIS — F411 Generalized anxiety disorder: Secondary | ICD-10-CM | POA: Diagnosis not present

## 2023-01-26 DIAGNOSIS — M5412 Radiculopathy, cervical region: Secondary | ICD-10-CM

## 2023-01-26 DIAGNOSIS — M542 Cervicalgia: Secondary | ICD-10-CM

## 2023-01-26 DIAGNOSIS — I152 Hypertension secondary to endocrine disorders: Secondary | ICD-10-CM | POA: Diagnosis not present

## 2023-01-26 DIAGNOSIS — K219 Gastro-esophageal reflux disease without esophagitis: Secondary | ICD-10-CM

## 2023-01-26 LAB — CBC WITH DIFFERENTIAL/PLATELET
Basophils Absolute: 0.1 10*3/uL (ref 0.0–0.2)
Basos: 1 %
EOS (ABSOLUTE): 0.1 10*3/uL (ref 0.0–0.4)
Eos: 1 %
Hematocrit: 44.3 % (ref 34.0–46.6)
Hemoglobin: 14.8 g/dL (ref 11.1–15.9)
Immature Grans (Abs): 0 10*3/uL (ref 0.0–0.1)
Immature Granulocytes: 0 %
Lymphocytes Absolute: 3 10*3/uL (ref 0.7–3.1)
Lymphs: 38 %
MCH: 33.2 pg — ABNORMAL HIGH (ref 26.6–33.0)
MCHC: 33.4 g/dL (ref 31.5–35.7)
MCV: 99 fL — ABNORMAL HIGH (ref 79–97)
Monocytes Absolute: 0.6 10*3/uL (ref 0.1–0.9)
Monocytes: 8 %
Neutrophils Absolute: 4.2 10*3/uL (ref 1.4–7.0)
Neutrophils: 52 %
Platelets: 309 10*3/uL (ref 150–450)
RBC: 4.46 x10E6/uL (ref 3.77–5.28)
RDW: 12.5 % (ref 11.7–15.4)
WBC: 7.9 10*3/uL (ref 3.4–10.8)

## 2023-01-26 LAB — CMP14+EGFR
ALT: 11 [IU]/L (ref 0–32)
AST: 10 [IU]/L (ref 0–40)
Albumin: 4.3 g/dL (ref 3.9–4.9)
Alkaline Phosphatase: 78 [IU]/L (ref 44–121)
BUN/Creatinine Ratio: 11 (ref 9–23)
BUN: 10 mg/dL (ref 6–24)
Bilirubin Total: 0.5 mg/dL (ref 0.0–1.2)
CO2: 23 mmol/L (ref 20–29)
Calcium: 9.5 mg/dL (ref 8.7–10.2)
Chloride: 102 mmol/L (ref 96–106)
Creatinine, Ser: 0.92 mg/dL (ref 0.57–1.00)
Globulin, Total: 2.5 g/dL (ref 1.5–4.5)
Glucose: 98 mg/dL (ref 70–99)
Potassium: 4.9 mmol/L (ref 3.5–5.2)
Sodium: 139 mmol/L (ref 134–144)
Total Protein: 6.8 g/dL (ref 6.0–8.5)
eGFR: 77 mL/min/{1.73_m2} (ref >59)

## 2023-01-26 LAB — LIPID PANEL
Chol/HDL Ratio: 3 {ratio} (ref 0.0–4.4)
Cholesterol, Total: 158 mg/dL (ref 100–199)
HDL: 52 mg/dL (ref >39)
LDL Chol Calc (NIH): 92 mg/dL (ref 0–99)
Triglycerides: 74 mg/dL (ref 0–149)
VLDL Cholesterol Cal: 14 mg/dL (ref 5–40)

## 2023-01-26 LAB — BAYER DCA HB A1C WAIVED: HB A1C (BAYER DCA - WAIVED): 5.5 % (ref 4.8–5.6)

## 2023-01-26 MED ORDER — EMPTY CONTAINER
0 refills | 0.00 days
Start: 2023-01-26 — End: ?

## 2023-01-26 MED ORDER — OMEPRAZOLE 20 MG PO CPDR: DELAYED_RELEASE_CAPSULE | ORAL | 1 refills | Status: DC

## 2023-01-26 MED ORDER — GABAPENTIN 600 MG PO TABS: 600.0000 mg | ORAL_TABLET | Freq: Three times a day (TID) | ORAL | 5 refills | Status: DC

## 2023-01-26 MED ORDER — ESCITALOPRAM OXALATE 20 MG PO TABS: 20.0000 mg | ORAL_TABLET | Freq: Every day | ORAL | 2 refills | Status: DC

## 2023-01-26 MED ORDER — ROSUVASTATIN CALCIUM 5 MG PO TABS: 5.0000 mg | ORAL_TABLET | ORAL | 3 refills | Status: DC

## 2023-01-26 MED ORDER — TIRZEPATIDE 5 MG/0.5ML ~~LOC~~ SOAJ: 5.0000 mg | SUBCUTANEOUS | 1 refills | Status: DC

## 2023-01-26 MED ORDER — TIRZEPATIDE 2.5 MG/0.5ML ~~LOC~~ SOAJ: 2.5000 mg | SUBCUTANEOUS | 0 refills | Status: DC

## 2023-01-26 MED ORDER — LISINOPRIL 20 MG PO TABS: 20.0000 mg | ORAL_TABLET | Freq: Every day | ORAL | 3 refills | Status: DC

## 2023-01-26 MED ORDER — PRAZOSIN HCL 2 MG PO CAPS: 2.0000 mg | ORAL_CAPSULE | Freq: Every day | ORAL | 1 refills | Status: DC

## 2023-01-26 MED ORDER — CELECOXIB 200 MG PO CAPS: 200.0000 mg | ORAL_CAPSULE | Freq: Two times a day (BID) | ORAL | 1 refills | Status: DC

## 2023-01-26 MED ORDER — BUSPIRONE HCL 7.5 MG PO TABS: 7.5000 mg | ORAL_TABLET | Freq: Three times a day (TID) | ORAL | 1 refills | Status: DC | PRN

## 2023-01-26 MED ORDER — AMLODIPINE BESYLATE 10 MG PO TABS: 10.0000 mg | ORAL_TABLET | Freq: Every day | ORAL | 3 refills | Status: DC

## 2023-01-26 NOTE — Patient Instructions (Signed)
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
## 2023-01-26 NOTE — Unmapped (Signed)
[facility] Refill Coordination Note    Specialty Medication(s) to be Shipped:   Inflammatory Disorders: Cosentyx    Other medication(s) to be shipped: No additional medications requested for fill at this time     Morgan Edwards, DOB: [birth year]  Phone: [phone] (home)       All above HIPAA information was verified with patient.     Was a Nurse, learning disability used for this call? No    Completed refill call assessment today to schedule patient's medication shipment from the [facility] [phone]).  All relevant notes have been reviewed.     Specialty medication(s) and dose(s) confirmed: Regimen is correct and unchanged.   Changes to medications: Morgan Edwards reports no changes at this time.  Changes to insurance: No  New side effects reported not previously addressed with a pharmacist or physician: None reported  Questions for the pharmacist: No    Confirmed patient received a Conservation officer, historic buildings and a Surveyor, mining with first shipment. The patient will receive a drug information handout for each medication shipped and additional FDA Medication Guides as required.       DISEASE/MEDICATION-SPECIFIC INFORMATION        For patients on injectable medications: Patient currently has 0 doses left.  Next injection is scheduled for 1/15.    SPECIALTY MEDICATION ADHERENCE     Medication Adherence    Patient reported X missed doses in the last month: 0  Specialty Medication: secukinumab (COSENTYX PEN, 2 PENS,) 150 mg/mL PnIj injection  Patient is on additional specialty medications: No  Informant: patient              Were doses missed due to medication being on hold? No    Cosentyx 150 mg/ml: 0 doses of medicine on hand        REFERRAL TO PHARMACIST     Referral to the pharmacist: Not needed      [facility]     Shipping address confirmed in Epic.       Delivery Scheduled: Yes, Expected medication delivery date: 1/16.     Medication will be delivered via UPS to the prescription address in Epic WAM.    [name]   [name] Specialty Technician

## 2023-01-27 LAB — MICROALBUMIN / CREATININE URINE RATIO
Creatinine, Urine: 42 mg/dL
Microalb/Creat Ratio: 35 mg/g{creat} — ABNORMAL HIGH (ref 0–29)
Microalbumin, Urine: 14.7 ug/mL

## 2023-01-28 MED FILL — EMPTY CONTAINER: 120 days supply | Qty: 1 | Fill #0

## 2023-01-28 MED FILL — COSENTYX PEN 300 MG/2 PENS (150 MG/ML) SUBCUTANEOUS: SUBCUTANEOUS | 28 days supply | Qty: 2 | Fill #4

## 2023-01-31 ENCOUNTER — Other Ambulatory Visit: Payer: Self-pay | Admitting: Family

## 2023-01-31 DIAGNOSIS — E1142 Type 2 diabetes mellitus with diabetic polyneuropathy: Secondary | ICD-10-CM

## 2023-02-04 NOTE — Telephone Encounter (Signed)
Called patient she needs a letter stating that she needs dentures to be able to eat due to medical necessity. Please advise if it is okay to write letter for patient.

## 2023-02-06 ENCOUNTER — Encounter: Payer: Self-pay | Admitting: Family

## 2023-02-09 ENCOUNTER — Ambulatory Visit
Admit: 2023-02-09 | Payer: BLUE CROSS/BLUE SHIELD | Attending: Obstetrics & Gynecology | Primary: Obstetrics & Gynecology

## 2023-02-09 DIAGNOSIS — F4310 Post-traumatic stress disorder, unspecified: Secondary | ICD-10-CM | POA: Diagnosis not present

## 2023-02-09 DIAGNOSIS — R7303 Prediabetes: Secondary | ICD-10-CM | POA: Diagnosis not present

## 2023-02-09 DIAGNOSIS — D069 Carcinoma in situ of cervix, unspecified: Secondary | ICD-10-CM | POA: Diagnosis not present

## 2023-02-09 DIAGNOSIS — Z13220 Encounter for screening for lipoid disorders: Secondary | ICD-10-CM | POA: Diagnosis not present

## 2023-02-09 DIAGNOSIS — I10 Essential (primary) hypertension: Secondary | ICD-10-CM | POA: Diagnosis not present

## 2023-02-09 DIAGNOSIS — Z0131 Encounter for examination of blood pressure with abnormal findings: Secondary | ICD-10-CM | POA: Diagnosis not present

## 2023-02-09 DIAGNOSIS — Z1389 Encounter for screening for other disorder: Secondary | ICD-10-CM | POA: Diagnosis not present

## 2023-02-09 DIAGNOSIS — B20 Human immunodeficiency virus [HIV] disease: Secondary | ICD-10-CM | POA: Diagnosis not present

## 2023-02-09 NOTE — Unmapped (Signed)
Pt no show for visit today. Please contact her to reschedule follow up

## 2023-02-09 NOTE — Unmapped (Unsigned)
Provider: Reginold Agent, MD  Division: Ob/Gyn [facility]    ASSESSMENT AND PLAN     @[email or web address] @      Medical decision making  {gendreview diagnostic review, record review, obtained records:56995} ***    I personally spent *** minutes face-to-face and non face-to-face in the care of this patient, which indicates all pre, intra and post visit time on the date of service     SUBJECTIVE     Chief Complaint:  ***     Ms. Morgan Edwards is a 48 y.o. [phone] who presents upon request of Referred Self with complaints of ***    Pathology 06/2021:  A: Cervix, endocervix curettage  - High grade squamous intraepithelial lesion (CIN 2 and CIN 3), present as detached and partial fragments, see comment  -Fragments of benign endocervical glands and benign endocervical glandular tissue with acute cervicitis and reactive epithelial changes     B: Cervix, biopsy  -Low grade squamous intraepithelial lesion (CIN 1)  -Scant fragments of benign endocervical glands     C: Endometrium, curettage  -Proliferative endometrium with glandular and stromal breakdown    Past Medical History:   Diagnosis Date    Anxiety and depression 12/06/2012    2014-11 reports chronic depression 2nd to multiple traumatic experiences.      Cervical dysplasia 10/26/2015    Cervical Dysplasia 07/2014: ASC-H, HPV pos 09/2014: CIN 1 at 9 o'clock, ECC neg 10/2015: ASC-H, HPV pos 12/2015: Colpo at 12 o clock, ECC, both negative    Depression, recurrent (CMS-HCC) 12/06/2012    Diabetes mellitus, type 2 (CMS-HCC) 11/23/2012    No meds at start of pregnancy -> glyburide 2.5 mg qhs -> glyburide 5 mg qhs A1c 5.1 (Jan 2015)    Essential hypertension 11/23/2012    On 100mg  Labetalol BID at start of pregnancy Baseline HELLP labs wnl    GAD (generalized anxiety disorder) 04/04/2014    GERD (gastroesophageal reflux disease) since 2010    HIV disease (CMS-HCC) 2014    Dx in pregnancy    MRSA (methicillin resistant Staphylococcus aureus)     Obesity, morbid with BMI of 45.0-49.9, adult 11/23/2012    BMI 49 at start of pregnancy TWG- 12 kg (4/2) Monthly growth [place]  [ ]  Consider ppx lovenox while in house after delivery     Obstructive sleep apnea 02/01/2013    did not tolerate PAP    Panic attack as reaction to stress 11/03/2013    Pneumonia 10/2021    Psoriasis     PTSD w/ depression 12/06/2012    2014-11 reports chronic depression 2nd to multiple traumatic experiences.      Restless leg syndrome 02/01/2013    2015-01 Sleep study results = possible restless leg syndrome; NEEDS EVALUATION AND FERRITIN     Tobacco use 11/23/2012    1/2ppd at start of pregnancy -> 3 cig/day Patches don't work  4/2 - not motivated to quit     Tonsil and adenoid disease, chronic     Vitamin D deficiency 04/05/2014     Past Surgical History:   Procedure Laterality Date    ABSCESS DRAINAGE      CHOLECYSTECTOMY  2000    [facility]    DENTAL SURGERY      DILATION AND CURETTAGE OF UTERUS  [phone]    x2 for heavy bleeding    FULL DENT RESTOR:MAY INCL ORAL EXM;DENT XRAYS;PROPHY/FL TX;DENT RESTOR;PULP TX;DENT EXTR;DENT AP Bilateral 11/27/2014    Procedure: Full Dental Restor:May Incl Oral Exam;Dent Xrays;Prophy/Fl  Tx;Dent Restor;Pulp Tx;Dent Extr;Dent Appliances;  Surgeon: Roderick Pee, DDS;  Location: MAIN OR [place];  Service: Oral Medicine     OB History       Gravida   4    Para   3    Term   3    Preterm        AB   1    Living   3         SAB   1    IAB        Ectopic        Molar        Multiple        Live Births   3          Obstetric Comments   Induced at 37 weeks for both children due to large babies             Family History   Problem Relation Age of Onset    Diabetes Mother     Heart disease Mother     Diabetes Father     Clotting disorder Neg Hx     Anesthesia problems Neg Hx     Melanoma Neg Hx     Basal cell carcinoma Neg Hx     Squamous cell carcinoma Neg Hx     Celiac disease Neg Hx     Cholelithiasis Neg Hx     Cirrhosis Neg Hx     Colorectal Cancer Neg Hx     Crohn's disease Neg Hx Esophageal cancer Neg Hx     Hemochromatosis Neg Hx     Inflammatory bowel disease Neg Hx     Irritable bowel syndrome Neg Hx     Liver cancer Neg Hx     Pancreatic cancer Neg Hx     Pancreatitis Neg Hx     Stomach cancer Neg Hx     Ulcerative colitis Neg Hx     Bleeding Disorder Neg Hx      Social History     Socioeconomic History    Marital status: Single   Tobacco Use    Smoking status: Every Day     Current packs/day: 0.50     Average packs/day: 0.5 packs/day for 25.0 years (12.5 ttl pk-yrs)     Types: Cigarettes    Smokeless tobacco: Never    Tobacco comments:     3 cigarettes day   Vaping Use    Vaping status: Never Used   Substance and Sexual Activity    Alcohol use: No     Alcohol/week: 0.0 standard drinks of alcohol    Drug use: No    Sexual activity: Not Currently     Partners: Male   Other Topics Concern    Do you use sunscreen? No    Tanning bed use? Yes    Are you easily burned? Yes    Excessive sun exposure? No    Blistering sunburns? Yes   Social History Narrative    Single, not sexually active    Lives with mother, middle and youngest daughters as well as autistic grandson    Oldest daughter lives in [place]    Grew up in foster homes    Works at a gas station    Applying for disability     Social Drivers of [employer]: Low Risk  (12/12/2019)    Received from [facility], [facility]    Overall  Financial Resource Strain (CARDIA)     Difficulty of Paying Living Expenses: Not hard at all   Food Insecurity: No Food Insecurity (11/10/2021)    Received from [facility], [facility]    Hunger Vital Sign     Worried About Running Out of Food in the Last Year: Never true     Ran Out of Food in the Last Year: Never true   Transportation Needs: No Transportation Needs (11/10/2021)    Received from [facility], [facility]    [facility] - Transportation     Lack of Transportation (Medical): No     Lack of Transportation (Non-Medical): No   Physical Activity: Inactive (12/12/2019) Received from [facility], [facility]    Exercise Vital Sign     Days of Exercise per Week: 0 days     Minutes of Exercise per Session: 0 min   Stress: Stress Concern Present (12/12/2019)    Received from [facility], [facility]    [facility] - Occupational Stress Questionnaire     Feeling of Stress : Rather much   Social Connections: Moderately Isolated (12/12/2019)    Received from [facility], [facility]    Social Connection and Isolation Panel [NHANES]     Frequency of Communication with Friends and Family: More than three times a week     Frequency of Social Gatherings with Friends and Family: Three times a week     Attends Religious Services: Never     Active Member of Clubs or Organizations: Yes     Attends Banker Meetings: Never     Marital Status: Never married       Medications  Current Outpatient Medications   Medication Sig Dispense Refill    albuterol HFA 90 mcg/actuation inhaler Inhale 2 puffs every six (6) hours as needed. 18 g 0    amLODIPine (NORVASC) 10 MG tablet Take 1 tablet (10 mg total) by mouth daily.      cetirizine (ZYRTEC) 10 MG tablet Take 1 tablet (10 mg total) by mouth daily.      clobetasol (TEMOVATE) 0.05 % ointment Apply topically two (2) times a day as needed. Use on stubborn areas (feet, elbows, etc) until smooth. 60 g 2    DOVATO 50-300 mg Tab Take 1 tablet by mouth daily.      empty container (SHARPS-A-GATOR DISPOSAL SYSTEM) Misc Use as directed 1 each 0    empty container Misc use as directed to dispose of Cosentyx 1 each 2    empty container Misc Use as directed 1 each 0    fluticasone propionate (FLONASE) 50 mcg/actuation nasal spray 2 sprays into each nostril daily as needed.      gabapentin (NEURONTIN) 600 MG tablet Take 1 tablet (600 mg total) by mouth two (2) times a day.      lisinopriL (PRINIVIL,ZESTRIL) 20 MG tablet Take 1 tablet (20 mg total) by mouth daily.      MOUNJARO 5 mg/0.5 mL PnIj Inject 5 mg under the skin. naproxen (NAPROSYN) 500 MG tablet Take 1 tablet (500 mg total) by mouth two (2) times a day as needed (pain). 20 tablet 0    omeprazole (PRILOSEC) 20 MG capsule Take 1 capsule (20 mg total) by mouth daily.      secukinumab (COSENTYX PEN) 150 mg/mL PnIj injection Inject under the skin.      secukinumab (COSENTYX PEN, 2 PENS,) 150 mg/mL PnIj injection Inject the contents of 2 pens (300  mg total) under the skin every twenty-eight (28) days. Maintenance dose. 2 mL 5    urea 40 % Foam Apply daily to feet after baths or showers. 115 g 5    urea 40 % Lotn Apply at night as needed for dryness 226 g 5     No current facility-administered medications for this visit.       Allergies  Allergies   Allergen Reactions    Atorvastatin Other (See Comments)     Severe Thrush     Clindamycin Rash    Erythromycin Diarrhea, Other (See Comments) and Itching     Abdominal pain       Review of Systems  {gendrostop 12pt, noted in HPI, not obtained:56994}    General:                      {gendfever fever, fatigue, weight loss:56946::none}  Gyn:                            {gendgyn vaginal odor, vaginal discharge, itching or burning, bleeding with intercourse, painful intercourse:56951::none}  Skin:                            {gendskin rash, itching, mole change:56952::none}  HEENT:                      {gendHEENT headaches, hearing loss, sore throat, dental problems, sinus problems:56953::none}  Lymph:                        {gendlymph swollen lymph nodes, no swollen lymph nodes:56992::none}  Breasts:                       {gendbreasts lumps or masses, pain, nipple discharge:56965::none}  Respiratory:                {gendrespiratory shortness of breath, cough, wheezing:56966::none}  Cardiovascular:           {gendcardiovascular chest pain, heart palpitations, leg swelling:56971::none}  GI:                               {gendGI nausea, vomiting, diarrhea, constipation, bloating, heartburn, blood in stool, leakage of stool:56967::none}      Urinary:                       {gendurinary frequency, pain, blood in urine, leakage of urine, side or back pain, incomplete emptying:56969::none}  Musculoskeletal:         {gendmusculoskeletal muscle weakness, joint pain, joint swelling, movement limitations:56970::none}  Endocrine:                   {gendendocrine frequent thirst, heat intolerance, cold intolerance, hot flashes, dry skin:56954::none}  Psychological:             {gendpsychological depression, anxiety, panic attacks, SI/HI:56963::none}    OBJECTIVE     General: ***  There were no vitals taken for this visit.    Abdomen:  Soft, non-tender, No masses, no organomegaly    Pelvic Exam:   EG/BUS:  Normal  Vagina:  Normal mucosa without evidence of lesion  Cervix:   Normal  Uterus:  Normal size, *** position, mobile, non tender  Adnexa:  No fullness or tenderness  Pelvic Floor: Non tender    A chaperone was present for any sensitive portions of the exam (if performed) including but not limited to, breast and pelvic examination.

## 2023-02-10 DIAGNOSIS — R0981 Nasal congestion: Secondary | ICD-10-CM | POA: Diagnosis not present

## 2023-02-10 DIAGNOSIS — R051 Acute cough: Secondary | ICD-10-CM | POA: Diagnosis not present

## 2023-02-10 DIAGNOSIS — I10 Essential (primary) hypertension: Secondary | ICD-10-CM | POA: Diagnosis not present

## 2023-02-10 DIAGNOSIS — Z6841 Body Mass Index (BMI) 40.0 and over, adult: Secondary | ICD-10-CM | POA: Diagnosis not present

## 2023-02-10 NOTE — Unmapped (Addendum)
Patient called [facility] requesting a virtual appointment with Dr. Erick Blinks to discuss LEEP procedure under sedation.  Pt states she was unable to make her appointment yesterday with provider due to transportation problems. Pt spoke to [place] at [facility], her appt now scheduled for 02/20/23 at 8:20a. Sending provider message to confirm virtual appointment would be ok.    Dr. Erick Blinks advised a virtual appt would be acceptable to discuss her LEEP procedure. LVM for pt.

## 2023-02-20 ENCOUNTER — Encounter
Admit: 2023-02-20 | Discharge: 2023-02-21 | Payer: BLUE CROSS/BLUE SHIELD | Attending: Obstetrics & Gynecology | Primary: Obstetrics & Gynecology

## 2023-02-20 DIAGNOSIS — D069 Carcinoma in situ of cervix, unspecified: Principal | ICD-10-CM

## 2023-02-20 NOTE — Unmapped (Signed)
[facility] Refill Coordination Note    Specialty Medication(s) to be Shipped:   Inflammatory Disorders: Cosentyx    Other medication(s) to be shipped: No additional medications requested for fill at this time     Morgan Edwards, DOB: [birth year]  Phone: [phone] (home)       All above HIPAA information was verified with patient.     Was a Nurse, learning disability used for this call? No    Completed refill call assessment today to schedule patient's medication shipment from the [facility] [phone]).  All relevant notes have been reviewed.     Specialty medication(s) and dose(s) confirmed: Regimen is correct and unchanged.   Changes to medications: Morgan Edwards reports no changes at this time.  Changes to insurance: No  New side effects reported not previously addressed with a pharmacist or physician: None reported  Questions for the pharmacist: No    Confirmed patient received a Conservation officer, historic buildings and a Surveyor, mining with first shipment. The patient will receive a drug information handout for each medication shipped and additional FDA Medication Guides as required.       DISEASE/MEDICATION-SPECIFIC INFORMATION        For patients on injectable medications: Patient currently has 0 doses left.  Next injection is scheduled for 2/13.    SPECIALTY MEDICATION ADHERENCE     Medication Adherence    Patient reported X missed doses in the last month: 0  Specialty Medication: secukinumab (COSENTYX PEN, 2 PENS,) 150 mg/mL PnIj injection  Patient is on additional specialty medications: No  Informant: patient              Were doses missed due to medication being on hold? No    Cosentyx 150 mg/ml: 0 doses of medicine on hand        REFERRAL TO PHARMACIST     Referral to the pharmacist: Not needed      [facility]     Shipping address confirmed in Epic.       Delivery Scheduled: Yes, Expected medication delivery date: 2/12.     Medication will be delivered via UPS to the prescription address in Epic WAM.    Morgan Edwards   [facility] Specialty Technician

## 2023-02-20 NOTE — Unmapped (Signed)
Provider: Reginold Agent, MD  Division: Ob/Gyn [facility]      Virtual Video Encounter       The patient reports they are physically located in [place] and is currently: at home. I conducted a audio/video visit. I spent  39m 43s on the video call with the patient. I spent an additional 5 minutes on pre- and post-visit activities on the date of service .        ASSESSMENT AND PLAN     Cervical intraepithelial neoplasia grade 3: Reviewed LEEP procedure and questions answered.  - Prep for procedure    Consent for Operation or Procedure: Provider Certification  I hereby certify that the nature, purpose, benefits, usual and most frequent risks of, and alternatives to, the operation or procedure have been explained to the patient (or person authorized to sign for the patient) either by a physician or by the provider who is to perform the operation or procedure. Those risks include: bleeding, infection, damage to cervix. We also discussed incomplete resolution of symptoms and possibility of future surgery. We reviewed post-op pain management and recovery expectations. The patient has had an opportunity to ask questions, and that those questions have been answered. The patient or the patient's representative has been advised that selected tasks may be performed by assistants to the primary health care provider(s) to include trainees and learners. I believe that the patient (or person authorized to sign for the patient) understands what has been explained, and has consented to the operation or procedure.        Medical Decision Making:  I personally reviewed previous records. reports    17 minutes was spent on this medically necessary service. Pt visit by Virtual Video in the setting of State of Emergency due to COVID-19 Pandemic.       SUBJECTIVE     Chief Complaint:  Discuss LEEP     Ms. Morgan Edwards is a 48 y.o. [phone] who presents via Virtual Video  to discuss LEEP procedure. She has been diagnosed with MGUS and is followed at [facility]. She reports easy bruisablity and bleeding and has had 2 D&C in the past    Pathology 06/2021:  A: Cervix, endocervix curettage  - High grade squamous intraepithelial lesion (CIN 2 and CIN 3), present as detached and partial fragments, see comment  -Fragments of benign endocervical glands and benign endocervical glandular tissue with acute cervicitis and reactive epithelial changes     B: Cervix, biopsy  -Low grade squamous intraepithelial lesion (CIN 1)  -Scant fragments of benign endocervical glands     C: Endometrium, curettage  -Proliferative endometrium with glandular and stromal breakdown    HIV: Non detected by patient report. 01/2023    Past Medical History:   Diagnosis Date    Anxiety and depression 12/06/2012    2014-11 reports chronic depression 2nd to multiple traumatic experiences.      Cervical dysplasia 10/26/2015    Cervical Dysplasia 07/2014: ASC-H, HPV pos 09/2014: CIN 1 at 9 o'clock, ECC neg 10/2015: ASC-H, HPV pos 12/2015: Colpo at 12 o clock, ECC, both negative    Depression, recurrent (CMS-HCC) 12/06/2012    Diabetes mellitus, type 2 (CMS-HCC) 11/23/2012    No meds at start of pregnancy -> glyburide 2.5 mg qhs -> glyburide 5 mg qhs A1c 5.1 (Jan 2015)    Essential hypertension 11/23/2012    On 100mg  Labetalol BID at start of pregnancy Baseline HELLP labs wnl    GAD (generalized anxiety disorder)  04/04/2014    GERD (gastroesophageal reflux disease) since 2010    HIV disease (CMS-HCC) 2014    Dx in pregnancy    MRSA (methicillin resistant Staphylococcus aureus)     Obesity, morbid with BMI of 45.0-49.9, adult 11/23/2012    BMI 49 at start of pregnancy TWG- 12 kg (4/2) Monthly growth [place]  [ ]  Consider ppx lovenox while in house after delivery     Obstructive sleep apnea 02/01/2013    did not tolerate PAP    Panic attack as reaction to stress 11/03/2013    Pneumonia 10/2021    Psoriasis     PTSD w/ depression 12/06/2012    2014-11 reports chronic depression 2nd to multiple traumatic experiences.      Restless leg syndrome 02/01/2013    2015-01 Sleep study results = possible restless leg syndrome; NEEDS EVALUATION AND FERRITIN     Tobacco use 11/23/2012    1/2ppd at start of pregnancy -> 3 cig/day Patches don't work  4/2 - not motivated to quit     Tonsil and adenoid disease, chronic     Vitamin D deficiency 04/05/2014     Past Surgical History:   Procedure Laterality Date    ABSCESS DRAINAGE      CHOLECYSTECTOMY  2000    [facility]    DENTAL SURGERY      DILATION AND CURETTAGE OF UTERUS  [phone]    x2 for heavy bleeding    FULL DENT RESTOR:MAY INCL ORAL EXM;DENT XRAYS;PROPHY/FL TX;DENT RESTOR;PULP TX;DENT EXTR;DENT AP Bilateral 11/27/2014    Procedure: Full Dental Restor:May Incl Oral Exam;Dent Xrays;Prophy/Fl Tx;Dent Restor;Pulp Tx;Dent Extr;Dent Appliances;  Surgeon: Roderick Pee, DDS;  Location: MAIN OR [place];  Service: Oral Medicine     OB History       Gravida   4    Para   3    Term   3    Preterm        AB   1    Living   3         SAB   1    IAB        Ectopic        Molar        Multiple        Live Births   3          Obstetric Comments   Induced at 37 weeks for both children due to large babies             Family History   Problem Relation Age of Onset    Diabetes Mother     Heart disease Mother     Diabetes Father     Clotting disorder Neg Hx     Anesthesia problems Neg Hx     Melanoma Neg Hx     Basal cell carcinoma Neg Hx     Squamous cell carcinoma Neg Hx     Celiac disease Neg Hx     Cholelithiasis Neg Hx     Cirrhosis Neg Hx     Colorectal Cancer Neg Hx     Crohn's disease Neg Hx     Esophageal cancer Neg Hx     Hemochromatosis Neg Hx     Inflammatory bowel disease Neg Hx     Irritable bowel syndrome Neg Hx     Liver cancer Neg Hx     Pancreatic cancer Neg Hx     Pancreatitis Neg Hx  Stomach cancer Neg Hx     Ulcerative colitis Neg Hx     Bleeding Disorder Neg Hx      Social History     Socioeconomic History    Marital status: Single   Tobacco Use    Smoking status: Every Day     Current packs/day: 0.50     Average packs/day: 0.5 packs/day for 25.0 years (12.5 ttl pk-yrs)     Types: Cigarettes    Smokeless tobacco: Never    Tobacco comments:     3 cigarettes day   Vaping Use    Vaping status: Never Used   Substance and Sexual Activity    Alcohol use: No     Alcohol/week: 0.0 standard drinks of alcohol    Drug use: No    Sexual activity: Not Currently     Partners: Male   Other Topics Concern    Do you use sunscreen? No    Tanning bed use? Yes    Are you easily burned? Yes    Excessive sun exposure? No    Blistering sunburns? Yes   Social History Narrative    Single, not sexually active    Lives with mother, middle and youngest daughters as well as autistic grandson    Oldest daughter lives in [place]    Grew up in foster homes    Works at a gas station    Applying for disability     Social Drivers of [employer]: Low Risk  (12/12/2019)    Received from [facility], [facility]    Overall Financial Resource Strain (CARDIA)     Difficulty of Paying Living Expenses: Not hard at all   Food Insecurity: No Food Insecurity (11/10/2021)    Received from [facility], [facility]    Hunger Vital Sign     Worried About Running Out of Food in the Last Year: Never true     Ran Out of Food in the Last Year: Never true   Transportation Needs: No Transportation Needs (11/10/2021)    Received from [facility], [facility]    PRAPARE - Transportation     Lack of Transportation (Medical): No     Lack of Transportation (Non-Medical): No   Physical Activity: Inactive (12/12/2019)    Received from [facility], [facility]    Exercise Vital Sign     Days of Exercise per Week: 0 days     Minutes of Exercise per Session: 0 min   Stress: Stress Concern Present (12/12/2019)    Received from [facility], [facility]    [facility] of Occupational [facility] - Occupational Stress Questionnaire     Feeling of Stress : Rather much   Social Connections: Moderately Isolated (12/12/2019)    Received from [facility], [facility]    Social Connection and Isolation Panel [NHANES]     Frequency of Communication with Friends and Family: More than three times a week     Frequency of Social Gatherings with Friends and Family: Three times a week     Attends Religious Services: Never     Active Member of Clubs or Organizations: Yes     Attends Banker Meetings: Never     Marital Status: Never married       Medications  Current Outpatient Medications   Medication Sig Dispense Refill    albuterol HFA 90 mcg/actuation inhaler Inhale 2 puffs every six (6) hours as needed. 18  g 0    amLODIPine (NORVASC) 10 MG tablet Take 1 tablet (10 mg total) by mouth daily.      cetirizine (ZYRTEC) 10 MG tablet Take 1 tablet (10 mg total) by mouth daily.      clobetasol (TEMOVATE) 0.05 % ointment Apply topically two (2) times a day as needed. Use on stubborn areas (feet, elbows, etc) until smooth. 60 g 2    DOVATO 50-300 mg Tab Take 1 tablet by mouth daily.      empty container (SHARPS-A-GATOR DISPOSAL SYSTEM) Misc Use as directed 1 each 0    empty container Misc use as directed to dispose of Cosentyx 1 each 2    empty container Misc Use as directed 1 each 0    fluticasone propionate (FLONASE) 50 mcg/actuation nasal spray 2 sprays into each nostril daily as needed.      gabapentin (NEURONTIN) 600 MG tablet Take 1 tablet (600 mg total) by mouth two (2) times a day.      lisinopriL (PRINIVIL,ZESTRIL) 20 MG tablet Take 1 tablet (20 mg total) by mouth daily.      MOUNJARO 5 mg/0.5 mL PnIj Inject 5 mg under the skin.      naproxen (NAPROSYN) 500 MG tablet Take 1 tablet (500 mg total) by mouth two (2) times a day as needed (pain). 20 tablet 0    omeprazole (PRILOSEC) 20 MG capsule Take 1 capsule (20 mg total) by mouth daily.      secukinumab (COSENTYX PEN) 150 mg/mL PnIj injection Inject under the skin.      secukinumab (COSENTYX PEN, 2 PENS,) 150 mg/mL PnIj injection Inject the contents of 2 pens (300 mg total) under the skin every twenty-eight (28) days. Maintenance dose. 2 mL 5    urea 40 % Foam Apply daily to feet after baths or showers. 115 g 5    urea 40 % Lotn Apply at night as needed for dryness 226 g 5     No current facility-administered medications for this visit.       Allergies  Allergies   Allergen Reactions    Atorvastatin Other (See Comments)     Severe Thrush     Clindamycin Rash    Erythromycin Diarrhea, Other (See Comments) and Itching     Abdominal pain       Review of Systems  Pertinent items are noted in HPI.    OBJECTIVE     General: WDWNF in NAD  There were no vitals taken for this visit.     Pelvic Exam not performed due to circumstances of phone visit

## 2023-02-24 MED FILL — COSENTYX PEN 300 MG/2 PENS (150 MG/ML) SUBCUTANEOUS: SUBCUTANEOUS | 28 days supply | Qty: 2 | Fill #5

## 2023-03-03 NOTE — Unmapped (Signed)
 Na

## 2023-03-04 NOTE — Unmapped (Signed)
 Na

## 2023-03-17 ENCOUNTER — Telehealth: Payer: Self-pay

## 2023-03-17 NOTE — Telephone Encounter (Signed)
 Copied from CRM [id number]. Topic: General - Other >> Mar 16, 2023  5:00 PM Alessandra Bevels wrote: Reason for CRM: Brett Fairy is calling from [facility] Department of Social Services calling to request an updated letter from [place] on the patients ability to work. Requesting the letter to state is the patient able to work? And if not for how long? CB- [phone] Fax [phone]

## 2023-03-17 NOTE — Telephone Encounter (Signed)
 FAXED TO FAX NUMBER GIVEN

## 2023-03-17 NOTE — Telephone Encounter (Signed)
 Note sent to MyChart.

## 2023-03-24 DIAGNOSIS — L409 Psoriasis, unspecified: Principal | ICD-10-CM

## 2023-03-24 MED ORDER — COSENTYX PEN 300 MG/2 PENS (150 MG/ML) SUBCUTANEOUS
SUBCUTANEOUS | 5 refills | 28.00 days
Start: 2023-03-24 — End: ?

## 2023-03-25 MED ORDER — COSENTYX PEN 300 MG/2 PENS (150 MG/ML) SUBCUTANEOUS
SUBCUTANEOUS | 5 refills | 28.00 days
Start: 2023-03-25 — End: ?

## 2023-03-25 NOTE — Unmapped (Signed)
 Appointment on 05/27 at 10:15 am   Patient wants to know if bridge can be given at this time?    Thanks  Bear Stearns

## 2023-03-25 NOTE — Unmapped (Signed)
 03/25/2023-At this time, we are unable to schedule delivery for Morgan Edwards's Cosentyx. Provider has denied refill request until patient has an appointment. Next appointment is scheduled 06/09/2023. Forde Radon

## 2023-03-26 ENCOUNTER — Other Ambulatory Visit: Payer: Self-pay | Admitting: Family

## 2023-03-26 DIAGNOSIS — L409 Psoriasis, unspecified: Principal | ICD-10-CM

## 2023-03-26 DIAGNOSIS — Z712 Person consulting for explanation of examination or test findings: Secondary | ICD-10-CM | POA: Diagnosis not present

## 2023-03-26 DIAGNOSIS — Z0131 Encounter for examination of blood pressure with abnormal findings: Secondary | ICD-10-CM | POA: Diagnosis not present

## 2023-03-26 DIAGNOSIS — D069 Carcinoma in situ of cervix, unspecified: Secondary | ICD-10-CM | POA: Diagnosis not present

## 2023-03-26 DIAGNOSIS — F4310 Post-traumatic stress disorder, unspecified: Secondary | ICD-10-CM | POA: Diagnosis not present

## 2023-03-26 DIAGNOSIS — E119 Type 2 diabetes mellitus without complications: Secondary | ICD-10-CM | POA: Diagnosis not present

## 2023-03-26 DIAGNOSIS — I10 Essential (primary) hypertension: Secondary | ICD-10-CM | POA: Diagnosis not present

## 2023-03-26 DIAGNOSIS — Z1389 Encounter for screening for other disorder: Secondary | ICD-10-CM | POA: Diagnosis not present

## 2023-03-26 MED ORDER — COSENTYX PEN 300 MG/2 PENS (150 MG/ML) SUBCUTANEOUS
SUBCUTANEOUS | 2 refills | 28.00 days | Status: CP
Start: 2023-03-26 — End: ?
  Filled 2023-04-02: qty 2, 28d supply, fill #0

## 2023-03-26 NOTE — Telephone Encounter (Signed)
 MOUNJARO 5 MG/0.5ML Pen   Pharmacy comment: Alternative Requested:NOT COVERED.

## 2023-03-26 NOTE — Unmapped (Signed)
 Returned call to patient .  Per patient, has now developed in last month,  psoriasis on palms of hands and bottom of feet.  PCP wanted her to come in to Dermatology sooner than her May appt if possible.  Patient is taking Cosentyx and clobetasol ointment.      Informed her will have scheduler call her for an earlier appt.

## 2023-03-26 NOTE — Unmapped (Signed)
 Returned call to patient after she LVM on nurse line stating has a spot on hand and foot that looks like psoriasis and wants to know if should be seen sooner.    LVM on generic vm letting her know her Dr's office returned call and for her to call back to [phone].

## 2023-03-27 ENCOUNTER — Other Ambulatory Visit: Payer: Self-pay | Admitting: Family Medicine

## 2023-03-27 ENCOUNTER — Ambulatory Visit: Payer: Medicaid Other | Admitting: Family

## 2023-03-27 ENCOUNTER — Other Ambulatory Visit: Payer: Self-pay | Admitting: Family

## 2023-03-27 ENCOUNTER — Encounter: Payer: Self-pay | Admitting: Family

## 2023-03-27 DIAGNOSIS — L409 Psoriasis, unspecified: Secondary | ICD-10-CM

## 2023-04-01 NOTE — Unmapped (Signed)
 [facility] Refill Coordination Note    Specialty Medication(s) to be Shipped:   Inflammatory Disorders: Cosentyx    Other medication(s) to be shipped: No additional medications requested for fill at this time     Morgan Edwards, DOB: [birth year]  Phone: [phone] (home)       All above HIPAA information was verified with patient.     Was a Nurse, learning disability used for this call? No    Completed refill call assessment today to schedule patient's medication shipment from the [facility]  [phone]).  All relevant notes have been reviewed.     Specialty medication(s) and dose(s) confirmed: Regimen is correct and unchanged.   Changes to medications: Destyne reports no changes at this time.  Changes to insurance: No  New side effects reported not previously addressed with a pharmacist or physician: None reported  Questions for the pharmacist: No    Confirmed patient received a Conservation officer, historic buildings and a Surveyor, mining with first shipment. The patient will receive a drug information handout for each medication shipped and additional FDA Medication Guides as required.       DISEASE/MEDICATION-SPECIFIC INFORMATION        For patients on injectable medications: Patient currently has 0 doses left.  Next injection is scheduled for 04/01/23.    SPECIALTY MEDICATION ADHERENCE     Medication Adherence    Patient reported X missed doses in the last month: 1  Specialty Medication: secukinumab (COSENTYX PEN, 2 PENS,) 150 mg/mL PnIj injection  Patient is on additional specialty medications: No  Patient is on more than two specialty medications: No  Any gaps in refill history greater than 2 weeks in the last 3 months: no  Demonstrates understanding of importance of adherence: yes              Were doses missed due to medication being on hold? No    COSENTYX PEN (2 PENS) 150   mg/ml: 0 days of medicine on hand       REFERRAL TO PHARMACIST     Referral to the pharmacist: Not needed      [facility]     Shipping address confirmed in Epic.     Cost and Payment: Patient has a copay of $4. They are aware and have authorized the pharmacy to charge the credit card on file.    Delivery Scheduled: Yes, Expected medication delivery date: 04/03/23.     Medication will be delivered via UPS to the prescription address in Epic WAM.    Moshe Salisbury   [facility]  Specialty Technician

## 2023-04-09 DIAGNOSIS — F4310 Post-traumatic stress disorder, unspecified: Secondary | ICD-10-CM | POA: Diagnosis not present

## 2023-04-09 DIAGNOSIS — H5213 Myopia, bilateral: Secondary | ICD-10-CM | POA: Diagnosis not present

## 2023-04-11 DIAGNOSIS — Z1211 Encounter for screening for malignant neoplasm of colon: Secondary | ICD-10-CM | POA: Diagnosis not present

## 2023-04-11 DIAGNOSIS — Z1212 Encounter for screening for malignant neoplasm of rectum: Secondary | ICD-10-CM | POA: Diagnosis not present

## 2023-04-15 NOTE — Unmapped (Signed)
 Your prior authorization for Cosentyx has been approved!  Return to DashboardEligible commercially insured patients can activate $0 Copay??? by visiting [email or web address] or by calling 1-844-COSENTYX [phone]).    ???Limitations apply. Up to a $16,000 annual limit. Offer not valid under Medicare, Medicaid, or any other federal or state program. [employer] reserves the right to rescind, revoke, or amend this program without notice. Limitations may apply in [place] and [place]. For complete Terms & Conditions details, call [phone].  Message from plan: PA Case: [id number], Status: Approved, Coverage Starts on: 04/15/2023 12:00:00 AM, Coverage Ends on: 04/14/2024 12:00:00 AM.. Authorization Expiration Date: April 14, 2024.    Received fax stating PA for Cosentyx has been approved and is good until 04/14/2024. Approval letter to be scanned into patients chart. Pt was informed via MyChart.

## 2023-04-16 DIAGNOSIS — F4310 Post-traumatic stress disorder, unspecified: Secondary | ICD-10-CM | POA: Diagnosis not present

## 2023-04-21 ENCOUNTER — Ambulatory Visit: Admit: 2023-04-21 | Discharge: 2023-04-22

## 2023-04-21 DIAGNOSIS — Z79899 Other long term (current) drug therapy: Principal | ICD-10-CM

## 2023-04-21 DIAGNOSIS — L409 Psoriasis, unspecified: Principal | ICD-10-CM

## 2023-04-21 MED ORDER — CLOBETASOL 0.05 % TOPICAL OINTMENT
Freq: Two times a day (BID) | TOPICAL | 6 refills | 0.00 days | Status: CP | PRN
Start: 2023-04-21 — End: 2024-04-20

## 2023-04-21 MED ORDER — COSENTYX PEN 300 MG/2 PENS (150 MG/ML) SUBCUTANEOUS
SUBCUTANEOUS | 11 refills | 28.00 days | Status: CP
Start: 2023-04-21 — End: ?
  Filled 2023-05-21: qty 2, 28d supply, fill #0

## 2023-04-21 NOTE — Unmapped (Addendum)
 Meet your team:     Your intake nurse is: Daxson Reffett    Please remember to fill out the survey you will receive after your visit. Your comments help us  continue to improve our care.      Thanks in advance!      [facility] Clinical Staff     Psoriasis: Care Instructions  Overview  Psoriasis (say suh-RY-uh-sus) is a long-term skin problem that causes thick, white, silvery, or red patches on the skin. The patches may be small or large, and they occur most often on the knees, elbows, scalp, hands, feet, or lower back.  The skin may be scaly. If the condition is severe, your skin can become itchy and tender. Psoriasis also can be embarrassing if the patches are on visible areas.  You can treat psoriasis with good care at home and with medicine from your doctor. You may put medicine on your skin and take pills or have shots to stop the redness and swelling. Your doctor also may suggest ultraviolet light treatments.  Follow-up care is a key part of your treatment and safety. Be sure to make and go to all appointments, and call your doctor if you are having problems. It's also a good idea to know your test results and keep a list of the medicines you take.  How can you care for yourself at home?  If your doctor prescribes medicine, use it exactly as prescribed. Follow your doctor's advice for sunlight or ultraviolet light treatment. Call your doctor if you think you are having a problem with your medicine.  Protect your skin:  Keep your skin moist. After bathing, put an ointment, cream, or lotion on your skin while it is still damp. This seals in moisture. Use over-the-counter products that your doctor suggests. These may include Cetaphil, Lubriderm, or Eucerin. Petroleum jelly (such as Vaseline) and vegetable shortening (such as Crisco) also work.  If you have psoriasis on your scalp, use a shampoo with salicylic acid, such as Neutrogena T/Sal.  Avoid harsh skin products, such as those that contain alcohol.  Try to prevent sunburn. Although short periods of sun exposure reduce psoriasis in most people, too much sun can damage the skin and cause skin cancer. In addition, sunburns can trigger psoriasis. Use sunscreen on areas of your skin that do not have psoriasis. Make sure to use a broad-spectrum sunscreen that has a sun protection factor (SPF) of 30 or higher. Use it every day, even when it is cloudy.  Take care to avoid accidents such as cutting or scraping your skin. An injury to the skin can cause psoriasis patches to form anywhere on the body, including the area of the injury.  Try making one or more changes to your daily habits to help with managing your psoriasis. For example:  Try to control stress and anxiety. They may cause psoriasis to appear suddenly or can make symptoms worse.  If you smoke, think about quitting. If you need help quitting, talk to your doctor about stop-smoking programs and medicines.  If you drink, limit or reduce the amount of alcohol you drink.  If you are overweight, see if you can lose some weight.  Seek support from family and friends. Talk to a counselor or other professional if you feel sad about your condition and need more help.  When should you call for help?   Call your doctor now or seek immediate medical care if:    You have signs of infection, such as:  Increased pain, swelling, warmth, or redness.  Red streaks leading from the area.  Pus draining from the area.  A fever.   Watch closely for changes in your health, and be sure to contact your doctor if:    You have swelling, stiffness, or pain in your joints.     You do not get better as expected.   Where can you learn more?  Go to [facility] at [email or web address]://my[facility]chart.Armed forces logistics/support/administrative officer in the Menu. Enter [id number] in the search box to learn more about Psoriasis: Care Instructions.  Current as of: November 28, 2021  Content Version: 14.3  ?? 2024 [place], [place].   Care instructions adapted under license by [facility]. If you have questions about a medical condition or this instruction, always ask your healthcare professional. Laren Player, [facility], disclaims any warranty or liability for your use of this information.       Over the counter UREA 40% Cream
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
## 2023-04-22 ENCOUNTER — Other Ambulatory Visit: Payer: Self-pay | Admitting: Family

## 2023-04-22 DIAGNOSIS — F4310 Post-traumatic stress disorder, unspecified: Secondary | ICD-10-CM

## 2023-04-22 DIAGNOSIS — F411 Generalized anxiety disorder: Secondary | ICD-10-CM

## 2023-04-23 DIAGNOSIS — F4310 Post-traumatic stress disorder, unspecified: Secondary | ICD-10-CM | POA: Diagnosis not present

## 2023-04-27 ENCOUNTER — Inpatient Hospital Stay: Attending: Hematology

## 2023-04-27 ENCOUNTER — Other Ambulatory Visit: Payer: Self-pay

## 2023-04-27 DIAGNOSIS — D472 Monoclonal gammopathy: Secondary | ICD-10-CM

## 2023-04-27 LAB — COMPREHENSIVE METABOLIC PANEL WITH GFR
ALT: 12 U/L (ref 0–44)
AST: 14 U/L — ABNORMAL LOW (ref 15–41)
Albumin: 3.7 g/dL (ref 3.5–5.0)
Alkaline Phosphatase: 62 U/L (ref 38–126)
Anion gap: 9 (ref 5–15)
BUN: 15 mg/dL (ref 6–20)
CO2: 25 mmol/L (ref 22–32)
Calcium: 8.7 mg/dL — ABNORMAL LOW (ref 8.9–10.3)
Chloride: 104 mmol/L (ref 98–111)
Creatinine, Ser: 0.91 mg/dL (ref 0.44–1.00)
GFR, Estimated: >60 mL/min (ref >60)
Glucose, Bld: 103 mg/dL — ABNORMAL HIGH (ref 70–99)
Potassium: 4 mmol/L (ref 3.5–5.1)
Sodium: 138 mmol/L (ref 135–145)
Total Bilirubin: 0.4 mg/dL (ref 0.0–1.2)
Total Protein: 6.8 g/dL (ref 6.5–8.1)

## 2023-04-27 LAB — CBC WITH DIFFERENTIAL/PLATELET
Abs Immature Granulocytes: 0.02 10*3/uL (ref 0.00–0.07)
Basophils Absolute: 0.1 10*3/uL (ref 0.0–0.1)
Basophils Relative: 1 %
Eosinophils Absolute: 0.1 10*3/uL (ref 0.0–0.5)
Eosinophils Relative: 1 %
HCT: 44.2 % (ref 36.0–46.0)
Hemoglobin: 14.3 g/dL (ref 12.0–15.0)
Immature Granulocytes: 0 %
Lymphocytes Relative: 33 %
Lymphs Abs: 3.1 10*3/uL (ref 0.7–4.0)
MCH: 33.1 pg (ref 26.0–34.0)
MCHC: 32.4 g/dL (ref 30.0–36.0)
MCV: 102.3 fL — ABNORMAL HIGH (ref 80.0–100.0)
Monocytes Absolute: 0.7 10*3/uL (ref 0.1–1.0)
Monocytes Relative: 7 %
Neutro Abs: 5.4 10*3/uL (ref 1.7–7.7)
Neutrophils Relative %: 58 %
Platelets: 311 10*3/uL (ref 150–400)
RBC: 4.32 MIL/uL (ref 3.87–5.11)
RDW: 13.2 % (ref 11.5–15.5)
WBC: 9.2 10*3/uL (ref 4.0–10.5)
nRBC: 0 % (ref 0.0–0.2)

## 2023-04-27 LAB — LACTATE DEHYDROGENASE: LDH: 117 U/L (ref 98–192)

## 2023-04-28 LAB — KAPPA/LAMBDA LIGHT CHAINS
Kappa free light chain: 22.2 mg/L — ABNORMAL HIGH (ref 3.3–19.4)
Kappa, lambda light chain ratio: 1.41 (ref 0.26–1.65)
Lambda free light chains: 15.8 mg/L (ref 5.7–26.3)

## 2023-04-29 LAB — PROTEIN ELECTROPHORESIS, SERUM
A/G Ratio: 1.3 (ref 0.7–1.7)
Albumin ELP: 3.6 g/dL (ref 2.9–4.4)
Alpha-1-Globulin: 0.2 g/dL (ref 0.0–0.4)
Alpha-2-Globulin: 0.7 g/dL (ref 0.4–1.0)
Beta Globulin: 0.9 g/dL (ref 0.7–1.3)
Gamma Globulin: 0.9 g/dL (ref 0.4–1.8)
Globulin, Total: 2.8 g/dL (ref 2.2–3.9)
M-Spike, %: 0.3 g/dL — ABNORMAL HIGH
Total Protein ELP: 6.4 g/dL (ref 6.0–8.5)

## 2023-04-30 ENCOUNTER — Other Ambulatory Visit (HOSPITAL_COMMUNITY): Payer: Self-pay

## 2023-05-01 NOTE — Progress Notes (Signed)
 VIRTUAL VISIT via TELEPHONE NOTE [facility]   I connected with Jasmine Peck  on 05/04/23 at  3:27 PM by telephone and verified that I am speaking with the correct person using two identifiers.  Location: Patient: Home Provider: [facility]   I discussed the limitations, risks, security and privacy concerns of performing an evaluation and management service by telephone and the availability of in person appointments. I also discussed with the patient that there may be a patient responsible charge related to this service. The patient expressed understanding and agreed to proceed.  REASON FOR VISIT:  Follow-up for IgA kappa MGUS   PRIOR THERAPY: None   CURRENT THERAPY: Surveillance  INTERVAL HISTORY:  Jasmine Peck is contacted today for follow-up of IgA kappa MGUS.  She was last seen by NP Charlton Cooler on 11/06/2022.   At today's visit, she reports feeling fairly well.  She denies any recent hospital stays or major changes to her health since her last visit.  She denies any new onset bone pain or fractures.  She denies any B symptoms such as fever, chills, night sweats, unintentional weight loss.  No new neurologic symptoms such as tinnitus, new-onset hearing loss, blurred vision, headache, or dizziness.  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  No new masses or lymphadenopathy.   She has 75% energy and 100% appetite. She endorses that she is maintaining a stable weight.   ASSESSMENT & PLAN:  1.   IgA kappa MGUS: - Discovered during work-up by rheumatology for psoriatic arthritis - Initial work-up showed SPEP with M spike 0.4 and immunofixation showing IgA kappa.  LDH, UPEP with reflex, beta-2  microglobulin and serum free light chains all within normal limits. - Skeletal survey (04/24/2021): Questionable faint small lucencies noted over both proximal humeri Repeat skeletal survey (11/05/2021) showed faint lucency at proximal right fibular  metadiaphysis, no other lytic lesions or lucencies identified Right tibia/fibula x-ray (05/02/2022) negative for any osseous abnormalities - Most recent skeletal survey (10/30/2022): No lytic lesions of axial or appendicular skeleton - Most recent MGUS/myeloma panel (04/27/2023): Stable M spike 0.3 Normal FLC ratio 1.41 (kappa 22.2, lambda 15.8) Normal LDH No CRAB features at this time: Hgb 14.3, creatinine 0.91, calcium  8.7 - She does not have any B symptoms or new onset bone pains.    - We discussed the normal pathophysiology of MGUS and 1% per year chance of transformation to myeloma. - PLAN: Repeat MGUS panel in 6 months (PLUS skeletal survey) - RTC in 6 months for follow-up visit - We will consider bone marrow biopsy if she has any significant deviations from her baseline labs or develops any concerning B symptoms or CRAB features.   2.  Tonsillar calcifications - Skeletal survey (04/24/2021) revealed cervical spine calcifications over the region of the submandibular glands; calcified submandibular process including malignancy unable to be excluded. - Physical exam did not reveal any abnormalities when she was seen in office on 05/01/2021 - She smokes 1 pack/day cigarettes x25 years - Maxillofacial CT scan (05/13/2021): Radiographic abnormality correlates with calcifications within the enlarged palatine tonsils; negative for mass or salivary calculus; calcifications correlate with bilateral palatine tonsillitis. - PLAN: Discussed with patient that these findings are benign, but if they are bothersome or symptomatic, she can see her dentist or be referred to ENT.   3.  Psoriatic arthritis: - Previously started on methotrexate and topical creams.  Unfortunately developed an anaphylactic reaction to methotrexate.  This was discontinued.   --  She is now taking Cosentyx - PLAN: Continue follow-up with rheumatology   4.   HIV: - Followed by infectious disease every 6 months. - Well  controlled-viral load undetectable per patient. - PLAN: Continue follow-up with infectious disease.   5.  Social history: - No family history of blood disorders or cancer per patient. - Distant family relatives had esophageal cancer. - Current everyday smoker for the past 25 years.  Approximately 1 pack/day. - She does not yet meet criteria for LDCT lung cancer screening due to age < 71   PLAN SUMMARY: >> Labs in 6 months = CBC/D, CMP, SPEP, light chains, LDH >> SKELETAL SURVEY (whole body x-ray) in 6 months >> OFFICE visit in 6 months (1 week after labs/x-ray)     REVIEW OF SYSTEMS:   Review of Systems  Constitutional:  Negative for chills, diaphoresis, fever, malaise/fatigue and weight loss.  Respiratory:  Negative for cough and shortness of breath.   Cardiovascular:  Negative for chest pain and palpitations.  Gastrointestinal:  Negative for abdominal pain, blood in stool, melena, nausea and vomiting.  Neurological:  Negative for dizziness and headaches.  Psychiatric/Behavioral:  The patient is nervous/anxious.      PHYSICAL EXAM: (per limitations of virtual telephone visit)  The patient is alert and oriented x 3, exhibiting adequate mentation, good mood, and ability to speak in full sentences and execute sound judgement.  WRAP UP:   I discussed the assessment and treatment plan with the patient. The patient was provided an opportunity to ask questions and all were answered. The patient agreed with the plan and demonstrated an understanding of the instructions.   The patient was advised to call back or seek an in-person evaluation if the symptoms worsen or if the condition fails to improve as anticipated.  I provided 12 minutes of non-face-to-face time during this encounter, with > 10 minutes of medical discussion.  Sonnie Dusky, PA-C 05/04/23 3:32 PM

## 2023-05-01 NOTE — Unmapped (Deleted)
 [facility] and Home Delivery Pharmacy Refill Coordination Note    Morgan Edwards, [place]: Jun 29, 1975  Phone: [phone] (home)       All above HIPAA information was verified with patient.         04/30/2023     4:29 PM   Specialty Rx Medication Refill Questionnaire   Which Medications would you like refilled and shipped? Cosentyx none   Please list all current allergies: Erythromycin,clydmacin   Have you missed any doses in the last 30 days? No   Have you had any changes to your medication(s) since your last refill? No   How many days remaining of each medication do you have at home? None   If receiving an injectable medication, next injection date is 05/25/2023   Have you experienced any side effects in the last 30 days? No   Please enter the full address (street address, city, state, zip code) where you would like your medication(s) to be delivered to. [address] [place] [address]   Please specify on which day you would like your medication(s) to arrive. Note: if you need your medication(s) within 3 days, please call the pharmacy to schedule your order at [phone]  05/23/2023   Has your insurance changed since your last refill? No   Would you like a pharmacist to call you to discuss your medication(s)? No   Do you require a signature for your package? (Note: if we are billing Medicare Part B or your order contains a controlled substance, we will require a signature) No   I have been provided my out of pocket cost for my medication and approve the pharmacy to charge the amount to my credit card on file. No   Additional Comments: Thank you         Completed refill call assessment today to schedule patient's medication shipment from the [facility] Pharm[facility] [phone]).  All relevant notes have been reviewed.       Confirmed patient received a Conservation officer, historic buildings and a Surveyor, mining with first shipment. The patient will receive a drug information handout for each medication shipped and additional FDA Medication Guides as required.         REFERRAL TO PHARMACIST     Referral to the pharmacist: Not needed      [facility]     Shipping address confirmed in Epic.     Delivery Scheduled: {Blank:19197::Yes, Expected medication delivery date: ***.,Yes, Expected medication delivery date: ***.  However, Rx request for refills was sent to the provider as there are none remaining.,Patient declined refill at this time due to ***.,No, cannot schedule delivery at this time as there are outstanding items that need addressed.  This note has been handed off to the provider for follow up.,Due to patient insurance changes, unable to fill at [facility], please route Rx to *** specialty pharmacy}     Medication will be delivered via {Blank:19197::UPS,Next Day Courier,Same Day Courier,Clinic Courier - *** clinic,***} to the {Blank:19197::prescription,temporary} address in Epic WAM.    Omar Bibber, PharmD   [facility] Pharm[facility] Specialty {Blank:19197::Pharmacist,Technician}

## 2023-05-04 ENCOUNTER — Inpatient Hospital Stay

## 2023-05-04 ENCOUNTER — Inpatient Hospital Stay: Admitting: Physician Assistant

## 2023-05-04 DIAGNOSIS — F331 Major depressive disorder, recurrent, moderate: Secondary | ICD-10-CM | POA: Diagnosis not present

## 2023-05-04 DIAGNOSIS — D472 Monoclonal gammopathy: Secondary | ICD-10-CM

## 2023-05-07 ENCOUNTER — Inpatient Hospital Stay: Payer: Medicaid Other

## 2023-05-08 DIAGNOSIS — F4310 Post-traumatic stress disorder, unspecified: Secondary | ICD-10-CM | POA: Diagnosis not present

## 2023-05-11 NOTE — Unmapped (Signed)
**Morgan Edwards De-Identified via Obfuscation** ..  05/11/2023 - Patient originally requested delivery for 05/23/2023. Delivery is not possible on this date due to no  UPS Saturday delivery; refrigerated medication. I have reached out to the patient and confirmed that delivery on 05/22/2023 is ok.  ..05/11/2023 - Patient approved $4 copay while confirming new delivery date.      Morgan Morgan Edwards    Morgan Morgan Edwards, [place]: 1975/07/24  Phone: [phone] (home)       All above HIPAA information was verified with patient.         04/30/2023     4:29 PM   Specialty Rx Medication Refill Questionnaire   Which Medications would you like refilled and shipped? Cosentyx none   Please list all current allergies: Erythromycin,clydmacin   Have you missed any doses in the last 30 days? No   Have you had any changes to your medication(s) since your last refill? No   How many days remaining of each medication do you have at home? None   If receiving an injectable medication, next injection date is 05/25/2023   Have you experienced any side effects in the last 30 days? No   Please enter the full address (street address, city, state, zip code) where you would like your medication(s) to be delivered to. [address] [place], [address]   Please specify on which day you would like your medication(s) to arrive. Morgan Edwards: if you need your medication(s) within 3 days, please call the pharmacy to schedule your order at [phone]  05/23/2023   Has your insurance changed since your last refill? No   Would you like a pharmacist to call you to discuss your medication(s)? No   Do you require a signature for your package? (Morgan Edwards: if we are billing Medicare Part B or your order contains a controlled substance, we will require a signature) No   I have been provided my out of pocket cost for my medication and approve the pharmacy to charge the amount to my credit card on file. No   Additional Comments: Thank you         Completed refill call assessment today to schedule patient's medication shipment from the [facility] Delivery Pharm[facility] [phone]).  All relevant notes have been reviewed.       Confirmed patient received a Conservation officer, historic buildings and a Surveyor, mining with first shipment. The patient will receive a drug information handout for each medication shipped and additional FDA Medication Guides as required.         REFERRAL TO PHARMACIST     Referral to the pharmacist: Not needed      [facility]     Shipping address confirmed in Epic.     Cost and Payment: Patient has a copay of $4. They are aware and have authorized the pharmacy to charge the credit card on file.    Delivery Scheduled: Yes, Expected medication delivery date: 5/9.     Medication will be delivered via UPS to the prescription address in Epic WAM.    [name]

## 2023-05-14 ENCOUNTER — Inpatient Hospital Stay: Payer: Medicaid Other | Admitting: Oncology

## 2023-05-26 ENCOUNTER — Other Ambulatory Visit: Payer: Self-pay | Admitting: Family

## 2023-05-26 ENCOUNTER — Encounter: Payer: Self-pay | Admitting: Family

## 2023-05-26 ENCOUNTER — Ambulatory Visit: Admitting: Family

## 2023-05-26 ENCOUNTER — Other Ambulatory Visit (HOSPITAL_COMMUNITY): Payer: Self-pay

## 2023-05-26 ENCOUNTER — Telehealth: Payer: Self-pay

## 2023-05-26 VITALS — BP 140/85 | HR 82 | Temp 96.0°F | Wt 314.0 lb

## 2023-05-26 DIAGNOSIS — F411 Generalized anxiety disorder: Secondary | ICD-10-CM | POA: Diagnosis not present

## 2023-05-26 DIAGNOSIS — E1142 Type 2 diabetes mellitus with diabetic polyneuropathy: Secondary | ICD-10-CM | POA: Diagnosis not present

## 2023-05-26 DIAGNOSIS — F331 Major depressive disorder, recurrent, moderate: Secondary | ICD-10-CM

## 2023-05-26 DIAGNOSIS — B20 Human immunodeficiency virus [HIV] disease: Secondary | ICD-10-CM

## 2023-05-26 DIAGNOSIS — F4310 Post-traumatic stress disorder, unspecified: Secondary | ICD-10-CM | POA: Diagnosis not present

## 2023-05-26 DIAGNOSIS — M5412 Radiculopathy, cervical region: Secondary | ICD-10-CM

## 2023-05-26 DIAGNOSIS — M542 Cervicalgia: Secondary | ICD-10-CM

## 2023-05-26 DIAGNOSIS — L409 Psoriasis, unspecified: Secondary | ICD-10-CM | POA: Diagnosis not present

## 2023-05-26 DIAGNOSIS — E1169 Type 2 diabetes mellitus with other specified complication: Secondary | ICD-10-CM

## 2023-05-26 DIAGNOSIS — Z0000 Encounter for general adult medical examination without abnormal findings: Secondary | ICD-10-CM | POA: Diagnosis not present

## 2023-05-26 DIAGNOSIS — G4733 Obstructive sleep apnea (adult) (pediatric): Secondary | ICD-10-CM

## 2023-05-26 DIAGNOSIS — F17200 Nicotine dependence, unspecified, uncomplicated: Secondary | ICD-10-CM

## 2023-05-26 DIAGNOSIS — K219 Gastro-esophageal reflux disease without esophagitis: Secondary | ICD-10-CM | POA: Diagnosis not present

## 2023-05-26 DIAGNOSIS — I152 Hypertension secondary to endocrine disorders: Secondary | ICD-10-CM | POA: Diagnosis not present

## 2023-05-26 DIAGNOSIS — E785 Hyperlipidemia, unspecified: Secondary | ICD-10-CM

## 2023-05-26 DIAGNOSIS — E1159 Type 2 diabetes mellitus with other circulatory complications: Secondary | ICD-10-CM | POA: Diagnosis not present

## 2023-05-26 DIAGNOSIS — Z0001 Encounter for general adult medical examination with abnormal findings: Secondary | ICD-10-CM | POA: Diagnosis not present

## 2023-05-26 LAB — BAYER DCA HB A1C WAIVED: HB A1C (BAYER DCA - WAIVED): 5.3 % (ref 4.8–5.6)

## 2023-05-26 MED ORDER — OMEPRAZOLE 20 MG PO CPDR: DELAYED_RELEASE_CAPSULE | ORAL | 1 refills | Status: DC

## 2023-05-26 MED ORDER — GABAPENTIN 600 MG PO TABS: 600.0000 mg | ORAL_TABLET | Freq: Three times a day (TID) | ORAL | 5 refills | Status: DC

## 2023-05-26 MED ORDER — CELECOXIB 200 MG PO CAPS: 200.0000 mg | ORAL_CAPSULE | Freq: Two times a day (BID) | ORAL | 1 refills | Status: DC

## 2023-05-26 MED ORDER — PRAZOSIN HCL 2 MG PO CAPS: 2.0000 mg | ORAL_CAPSULE | Freq: Every day | ORAL | 1 refills | Status: DC

## 2023-05-26 MED ORDER — ROSUVASTATIN CALCIUM 5 MG PO TABS: 5.0000 mg | ORAL_TABLET | ORAL | 3 refills | Status: DC

## 2023-05-26 MED ORDER — FLUTICASONE PROPIONATE 50 MCG/ACT NA SUSP: 2.0000 | Freq: Every day | NASAL | 1 refills | Status: DC

## 2023-05-26 MED ORDER — BUSPIRONE HCL 7.5 MG PO TABS: 7.5000 mg | ORAL_TABLET | Freq: Three times a day (TID) | ORAL | 0 refills | Status: DC | PRN

## 2023-05-26 MED ORDER — TIRZEPATIDE 7.5 MG/0.5ML ~~LOC~~ SOAJ: 7.5000 mg | SUBCUTANEOUS | 2 refills | Status: DC

## 2023-05-26 MED ORDER — LISINOPRIL 40 MG PO TABS: 40.0000 mg | ORAL_TABLET | Freq: Every day | ORAL | 3 refills | Status: DC

## 2023-05-26 MED ORDER — BREZTRI AEROSPHERE 160-9-4.8 MCG/ACT IN AERO: 2.0000 | INHALATION_SPRAY | Freq: Two times a day (BID) | RESPIRATORY_TRACT | 11 refills | Status: DC

## 2023-05-26 MED ORDER — AMLODIPINE BESYLATE 10 MG PO TABS: 10.0000 mg | ORAL_TABLET | Freq: Every day | ORAL | 3 refills | Status: DC

## 2023-05-26 MED ORDER — CETIRIZINE HCL 10 MG PO TABS: 10.0000 mg | ORAL_TABLET | Freq: Every day | ORAL | 1 refills | Status: DC

## 2023-05-26 MED ORDER — FUROSEMIDE 20 MG PO TABS: 20.0000 mg | ORAL_TABLET | Freq: Every day | ORAL | 0 refills | Status: DC

## 2023-05-26 MED ORDER — ESCITALOPRAM OXALATE 20 MG PO TABS: 20.0000 mg | ORAL_TABLET | Freq: Every day | ORAL | 2 refills | Status: DC

## 2023-05-26 MED ORDER — ACCU-CHEK SOFTCLIX LANCETS MISC: 3 refills | Status: AC

## 2023-05-26 MED ORDER — CLOBETASOL PROPIONATE 0.05 % EX OINT: TOPICAL_OINTMENT | Freq: Two times a day (BID) | CUTANEOUS | 2 refills | Status: DC

## 2023-05-26 NOTE — Telephone Encounter (Signed)
 Pharmacy Patient Advocate Encounter   Received notification from CoverMyMeds that prior authorization for Mounjaro  7.5MG /0.5ML auto-injectors is required/requested.   Insurance verification completed.   The patient is insured through [employer] .   Per test claim: The current 84 day co-pay is, $4.00.  No PA needed at this time. This test claim was processed through [facility]- copay amounts may vary at other pharmacies due to pharmacy/plan contracts, or as the patient moves through the different stages of their insurance plan.
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
## 2023-05-27 LAB — CMP14+EGFR
ALT: 12 IU/L (ref 0–32)
AST: 12 IU/L (ref 0–40)
Albumin: 4 g/dL (ref 3.9–4.9)
Alkaline Phosphatase: 72 IU/L (ref 44–121)
BUN/Creatinine Ratio: 18 (ref 9–23)
BUN: 17 mg/dL (ref 6–24)
Bilirubin Total: 0.4 mg/dL (ref 0.0–1.2)
CO2: 24 mmol/L (ref 20–29)
Calcium: 8.8 mg/dL (ref 8.7–10.2)
Chloride: 104 mmol/L (ref 96–106)
Creatinine, Ser: 0.94 mg/dL (ref 0.57–1.00)
Globulin, Total: 2.6 g/dL (ref 1.5–4.5)
Glucose: 83 mg/dL (ref 70–99)
Potassium: 4.6 mmol/L (ref 3.5–5.2)
Sodium: 141 mmol/L (ref 134–144)
Total Protein: 6.6 g/dL (ref 6.0–8.5)
eGFR: 75 mL/min/{1.73_m2} (ref >59)

## 2023-05-27 LAB — LIPID PANEL
Chol/HDL Ratio: 3 ratio (ref 0.0–4.4)
Cholesterol, Total: 124 mg/dL (ref 100–199)
HDL: 42 mg/dL (ref >39)
LDL Chol Calc (NIH): 68 mg/dL (ref 0–99)
Triglycerides: 65 mg/dL (ref 0–149)
VLDL Cholesterol Cal: 14 mg/dL (ref 5–40)

## 2023-05-27 LAB — CBC WITH DIFFERENTIAL/PLATELET
Basophils Absolute: 0.1 10*3/uL (ref 0.0–0.2)
Basos: 1 %
EOS (ABSOLUTE): 0.2 10*3/uL (ref 0.0–0.4)
Eos: 2 %
Hematocrit: 43.2 % (ref 34.0–46.6)
Hemoglobin: 14.4 g/dL (ref 11.1–15.9)
Immature Grans (Abs): 0 10*3/uL (ref 0.0–0.1)
Immature Granulocytes: 0 %
Lymphocytes Absolute: 3.5 10*3/uL — ABNORMAL HIGH (ref 0.7–3.1)
Lymphs: 39 %
MCH: 33.5 pg — ABNORMAL HIGH (ref 26.6–33.0)
MCHC: 33.3 g/dL (ref 31.5–35.7)
MCV: 101 fL — ABNORMAL HIGH (ref 79–97)
Monocytes Absolute: 0.5 10*3/uL (ref 0.1–0.9)
Monocytes: 6 %
Neutrophils Absolute: 4.8 10*3/uL (ref 1.4–7.0)
Neutrophils: 52 %
Platelets: 310 10*3/uL (ref 150–450)
RBC: 4.3 x10E6/uL (ref 3.77–5.28)
RDW: 13 % (ref 11.7–15.4)
WBC: 9.1 10*3/uL (ref 3.4–10.8)

## 2023-05-27 LAB — VITAMIN B12: Vitamin B-12: 302 pg/mL (ref 232–1245)

## 2023-05-27 LAB — TSH: TSH: 1.65 u[IU]/mL (ref 0.450–4.500)

## 2023-05-28 ENCOUNTER — Ambulatory Visit: Payer: Self-pay | Admitting: Family

## 2023-05-31 ENCOUNTER — Other Ambulatory Visit: Payer: Self-pay | Admitting: Family

## 2023-06-02 ENCOUNTER — Other Ambulatory Visit (HOSPITAL_COMMUNITY): Payer: Self-pay

## 2023-06-09 ENCOUNTER — Ambulatory Visit: Admitting: Family

## 2023-06-11 NOTE — Unmapped (Signed)
 [facility] Clinical Assessment & Refill Coordination Note    Morgan Edwards, DOB: 1975-09-18  Phone: [phone] (home)     All above HIPAA information was verified with patient.     Was a Nurse, learning disability used for this call? No    Specialty Medication(s):   Inflammatory Disorders: Cosentyx     Current Outpatient Medications   Medication Sig Dispense Refill    albuterol HFA 90 mcg/actuation inhaler Inhale 2 puffs every six (6) hours as needed. 18 g 0    amLODIPine (NORVASC) 10 MG tablet Take 1 tablet (10 mg total) by mouth daily.      cetirizine (ZYRTEC) 10 MG tablet Take 1 tablet (10 mg total) by mouth daily.      clobetasol (TEMOVATE) 0.05 % ointment Apply topically two (2) times a day as needed. Use on stubborn areas (feet, elbows, etc) until smooth. 60 g 6    DOVATO 50-300 mg Tab Take 1 tablet by mouth daily.      empty container (SHARPS-A-GATOR DISPOSAL SYSTEM) Misc Use as directed 1 each 0    empty container Misc use as directed to dispose of Cosentyx 1 each 2    empty container Misc Use as directed 1 each 0    fluticasone propionate (FLONASE) 50 mcg/actuation nasal spray 2 sprays into each nostril daily as needed.      gabapentin (NEURONTIN) 600 MG tablet Take 1 tablet (600 mg total) by mouth two (2) times a day.      lisinopriL (PRINIVIL,ZESTRIL) 20 MG tablet Take 1 tablet (20 mg total) by mouth daily.      naproxen (NAPROSYN) 500 MG tablet Take 1 tablet (500 mg total) by mouth two (2) times a day as needed (pain). 20 tablet 0    omeprazole (PRILOSEC) 20 MG capsule Take 1 capsule (20 mg total) by mouth daily.      secukinumab (COSENTYX PEN) 150 mg/mL PnIj injection Inject under the skin.      secukinumab (COSENTYX PEN, 2 PENS,) 150 mg/mL PnIj injection Inject the contents of 2 pens (300 mg total) under the skin every twenty-eight (28) days. Maintenance dose. 2 mL 11    urea 40 % Lotn Apply at night as needed for dryness 226 g 5     No current facility-administered medications for this visit.        Changes to medications: Morgan Edwards reports no changes at this time.    Medication list has been reviewed and updated in Epic: Yes    Allergies   Allergen Reactions    Atorvastatin Other (See Comments)     Severe Thrush     Clindamycin Rash    Erythromycin Diarrhea, Other (See Comments) and Itching     Abdominal pain       Changes to allergies: No    Allergies have been reviewed and updated in Epic: Yes    SPECIALTY MEDICATION ADHERENCE     Cosentyx - 0 left    Medication Adherence    Patient reported X missed doses in the last month: 0  Specialty Medication: Cosentyx          Specialty medication(s) dose(s) confirmed: Regimen is correct and unchanged.     Are there any concerns with adherence? No    Adherence counseling provided? Not needed    CLINICAL MANAGEMENT AND INTERVENTION      Clinical Benefit Assessment:    Do you feel the medicine is effective or helping your condition? Yes  Clinical Benefit counseling provided? Not needed    Adverse Effects Assessment:    Are you experiencing any side effects? No    Are you experiencing difficulty administering your medicine? No    Quality of Life Assessment:    Quality of Life    Rheumatology  Oncology  Dermatology  1. What impact has your specialty medication had on the symptoms of your skin condition (i.e. itchiness, soreness, stinging)?: Tremendous  2. What impact has your specialty medication had on your comfort level with your skin?: Tremendous  Cystic Fibrosis          How many days over the past month did your psoriasis  keep you from your normal activities? For example, brushing your teeth or getting up in the morning. Patient declined to answer    Have you discussed this with your provider? Not needed    Acute Infection Status:    Acute infections noted within Epic:  MRSA    Patient reported infection: None    Therapy Appropriateness:    Is therapy appropriate based on current medication list, adverse reactions, adherence, clinical benefit and progress toward achieving therapeutic goals? Yes, therapy is appropriate and should be continued     Clinical Intervention:    Was an intervention completed as part of this clinical assessment? No    DISEASE/MEDICATION-SPECIFIC INFORMATION      For patients on injectable medications: Patient currently has 0 doses left.  Next injection is scheduled for 6/9.    Chronic Inflammatory Diseases: Have you experienced any flares in the last month? No  Has this been reported to your provider? No    PATIENT SPECIFIC NEEDS     Does the patient have any physical, cognitive, or cultural barriers? No    Is the patient high risk? No    Does the patient require physician intervention or other additional services (i.e., nutrition, smoking cessation, social work)? No    Does the patient have an additional or emergency contact listed in their chart? Yes    SOCIAL DETERMINANTS OF HEALTH     At the [facility], we have learned that life circumstances - like trouble affording food, housing, utilities, or transportation can affect the health of many of our patients.   That is why we wanted to ask: are you currently experiencing any life circumstances that are negatively impacting your health and/or quality of life? Patient declined to answer    Social Drivers of Health     Food Insecurity: No Food Insecurity (11/10/2021)    Received from [facility], [facility]    Hunger Vital Sign     Worried About Running Out of Food in the Last Year: Never true     Ran Out of Food in the Last Year: Never true   Tobacco Use: High Risk (01/26/2023)    Received from [facility]    Patient History     Smoking Tobacco Use: Every Day     Smokeless Tobacco Use: Never     Passive Exposure: Not on file   Transportation Needs: No Transportation Needs (11/10/2021)    Received from [facility], [facility]    [facility] - Transportation     Lack of Transportation (Medical): No     Lack of Transportation (Non-Medical): No   Alcohol Use: Not At Risk (06/03/2019)    Alcohol Use     How often do you have a drink containing alcohol?: Never     How many drinks containing alcohol  do you have on a typical day when you are drinking?: 1 - 2     How often do you have 5 or more drinks on one occasion?: Never   Housing: Not on file   Physical Activity: Inactive (12/12/2019)    Received from [facility], [facility]    Exercise Vital Sign     Days of Exercise per Week: 0 days     Minutes of Exercise per Session: 0 min   Utilities: Not At Risk (11/10/2021)    Received from [facility], [facility]    [facility] Utilities     Threatened with loss of utilities: No   Stress: Stress Concern Present (12/12/2019)    Received from [facility], [facility]    [facility] of Occupational Health - Occupational Stress Questionnaire     Feeling of Stress : Rather much   Interpersonal Safety: Not on file   Substance Use: Not on file (11/20/2022)   Intimate Partner Violence: Not At Risk (11/10/2021)    Received from [facility], [facility]    Humiliation, Afraid, Rape, and Kick questionnaire     Fear of Current or Ex-Partner: No     Emotionally Abused: No     Physically Abused: No     Sexually Abused: No   Social Connections: Moderately Isolated (12/12/2019)    Received from [facility], [facility]    Social Connection and Isolation Panel [NHANES]     Frequency of Communication with Friends and Family: More than three times a week     Frequency of Social Gatherings with Friends and Family: Three times a week     Attends Religious Services: Never     Active Member of Clubs or Organizations: Yes     Attends Banker Meetings: Never     Marital Status: Never married   Programmer, applications: Low Risk  (12/12/2019)    Received from [facility], [facility]    Overall Financial Resource Strain (CARDIA)     Difficulty of Paying Living Expenses: Not hard at all   Health Literacy: Not on file   Internet Connectivity: Not on file       Would you be willing to receive help with any of the needs that you have identified today? Not applicable       SHIPPING     Specialty Medication(s) to be Shipped:   Inflammatory Disorders: Cosentyx    Other medication(s) to be shipped: No additional medications requested for fill at this time     Changes to insurance: No    Cost and Payment: Patient has a copay of $4. They are aware and have authorized the pharmacy to charge the credit card on file.    Delivery Scheduled: Yes, Expected medication delivery date: 6/4.     Medication will be delivered via UPS to the confirmed prescription address in [facility].    The patient will receive a drug information handout for each medication shipped and additional FDA Medication Guides as required.  Verified that patient has previously received a Conservation officer, historic buildings and a Surveyor, mining.    The patient or caregiver noted above participated in the development of this care plan and knows that they can request review of or adjustments to the care plan at any time.      All of the patient's questions and concerns have been addressed.    Shima Compere [facility] Specialty Pharmacist

## 2023-06-16 MED FILL — COSENTYX PEN 300 MG/2 PENS (150 MG/ML) SUBCUTANEOUS PEN INJECTOR: SUBCUTANEOUS | 84 days supply | Qty: 6 | Fill #1

## 2023-06-26 ENCOUNTER — Other Ambulatory Visit: Payer: Self-pay | Admitting: Family

## 2023-06-26 DIAGNOSIS — I152 Hypertension secondary to endocrine disorders: Secondary | ICD-10-CM

## 2023-07-09 DIAGNOSIS — J039 Acute tonsillitis, unspecified: Secondary | ICD-10-CM | POA: Diagnosis not present

## 2023-07-09 DIAGNOSIS — R070 Pain in throat: Secondary | ICD-10-CM | POA: Diagnosis not present

## 2023-07-09 DIAGNOSIS — I10 Essential (primary) hypertension: Secondary | ICD-10-CM | POA: Diagnosis not present

## 2023-07-09 DIAGNOSIS — Z6841 Body Mass Index (BMI) 40.0 and over, adult: Secondary | ICD-10-CM | POA: Diagnosis not present

## 2023-07-23 ENCOUNTER — Other Ambulatory Visit: Payer: Self-pay | Admitting: Family

## 2023-07-27 ENCOUNTER — Other Ambulatory Visit: Payer: Self-pay | Admitting: Family

## 2023-07-27 ENCOUNTER — Ambulatory Visit: Admitting: Family

## 2023-07-28 ENCOUNTER — Encounter: Payer: Self-pay | Admitting: Family

## 2023-09-07 NOTE — Unmapped (Signed)
 error

## 2023-09-07 NOTE — Unmapped (Signed)
 [facility] Refill Coordination Note    Morgan Edwards, [place]: Oct 02, 1975  Phone: [phone] (home)       All above HIPAA information was verified with patient.         09/03/2023     5:17 PM   Specialty Rx Medication Refill Questionnaire   Which Medications would you like refilled and shipped? Cosentyx    Please list all current allergies: On file   Have you missed any doses in the last 30 days? No   Have you had any changes to your medication(s) since your last refill? No   How much of each medication do you have remaining at home? (eg. number of tablets, injections, etc.) None   If receiving an injectable medication, next injection date is 09/23/2023   Have you experienced any side effects in the last 30 days? No   Please enter the full address (street address, city, state, zip code) where you would like your medication(s) to be delivered to. [address] [place] [address]   Please specify on which day you would like your medication(s) to arrive. Note: if you need your medication(s) within 3 days, please call the pharmacy to schedule your order at [phone]  09/21/2023   Has your insurance changed since your last refill? No   Would you like a pharmacist to call you to discuss your medication(s)? No   Do you require a signature for your package? (Note: if we are billing Medicare Part B or your order contains a controlled substance, we will require a signature) No   I have been provided my out of pocket cost for my medication and approve the pharmacy to charge the amount to my credit card on file. Yes   Additional Comments: Thanks         Completed refill call assessment today to schedule patient's medication shipment from the [facility] [phone]).  All relevant notes have been reviewed.       Confirmed patient received a Conservation officer, historic buildings and a Surveyor, mining with first shipment. The patient will receive a drug information handout for each medication shipped and additional FDA Medication Guides as required.         REFERRAL TO PHARMACIST     Referral to the pharmacist: Not needed      [facility]     Shipping address confirmed in Epic.     Delivery Scheduled: Yes, Expected medication delivery date: 9/9.     Medication will be delivered via UPS to the prescription address in Epic WAM.    Morgan Edwards [facility] and [facility] [place]

## 2023-09-21 MED FILL — COSENTYX PEN 300 MG/2 PENS (150 MG/ML) SUBCUTANEOUS PEN INJECTOR: SUBCUTANEOUS | 84 days supply | Qty: 6 | Fill #2

## 2023-09-24 ENCOUNTER — Other Ambulatory Visit: Payer: Self-pay | Admitting: Family

## 2023-09-24 NOTE — Telephone Encounter (Signed)
 Called patient to make appt and states she will call 09-25-2023 to make her appt.

## 2023-09-24 NOTE — Telephone Encounter (Signed)
 Christy pt NTBS 30-d given 07/27/23

## 2023-10-21 ENCOUNTER — Other Ambulatory Visit: Payer: Self-pay | Admitting: Family

## 2023-11-03 ENCOUNTER — Ambulatory Visit (HOSPITAL_COMMUNITY)
Admission: RE | Admit: 2023-11-03 | Discharge: 2023-11-03 | Disposition: A | Discharge status: Home / Self Care | Source: Ambulatory Visit | Attending: Physician Assistant | Admitting: Physician Assistant

## 2023-11-03 ENCOUNTER — Inpatient Hospital Stay: Attending: Physician Assistant

## 2023-11-03 DIAGNOSIS — L409 Psoriasis, unspecified: Principal | ICD-10-CM

## 2023-11-03 DIAGNOSIS — Z79899 Other long term (current) drug therapy: Principal | ICD-10-CM

## 2023-11-03 DIAGNOSIS — Z0131 Encounter for examination of blood pressure with abnormal findings: Secondary | ICD-10-CM | POA: Diagnosis not present

## 2023-11-03 DIAGNOSIS — Z712 Person consulting for explanation of examination or test findings: Secondary | ICD-10-CM | POA: Diagnosis not present

## 2023-11-03 DIAGNOSIS — Z21 Asymptomatic human immunodeficiency virus [HIV] infection status: Secondary | ICD-10-CM | POA: Insufficient documentation

## 2023-11-03 DIAGNOSIS — D472 Monoclonal gammopathy: Secondary | ICD-10-CM | POA: Insufficient documentation

## 2023-11-03 DIAGNOSIS — L4050 Arthropathic psoriasis, unspecified: Secondary | ICD-10-CM | POA: Diagnosis not present

## 2023-11-03 DIAGNOSIS — M4316 Spondylolisthesis, lumbar region: Secondary | ICD-10-CM | POA: Diagnosis not present

## 2023-11-03 DIAGNOSIS — N3941 Urge incontinence: Secondary | ICD-10-CM | POA: Diagnosis not present

## 2023-11-03 DIAGNOSIS — D069 Carcinoma in situ of cervix, unspecified: Secondary | ICD-10-CM | POA: Diagnosis not present

## 2023-11-03 DIAGNOSIS — B20 Human immunodeficiency virus [HIV] disease: Secondary | ICD-10-CM | POA: Diagnosis not present

## 2023-11-03 DIAGNOSIS — E119 Type 2 diabetes mellitus without complications: Secondary | ICD-10-CM | POA: Diagnosis not present

## 2023-11-03 DIAGNOSIS — R350 Frequency of micturition: Secondary | ICD-10-CM | POA: Diagnosis not present

## 2023-11-03 DIAGNOSIS — Z3200 Encounter for pregnancy test, result unknown: Secondary | ICD-10-CM | POA: Diagnosis not present

## 2023-11-03 DIAGNOSIS — F17210 Nicotine dependence, cigarettes, uncomplicated: Secondary | ICD-10-CM | POA: Diagnosis not present

## 2023-11-03 DIAGNOSIS — Z1389 Encounter for screening for other disorder: Secondary | ICD-10-CM | POA: Diagnosis not present

## 2023-11-03 LAB — CBC WITH DIFFERENTIAL/PLATELET
Abs Immature Granulocytes: 0.01 K/uL (ref 0.00–0.07)
Basophils Absolute: 0.1 K/uL (ref 0.0–0.1)
Basophils Relative: 1 %
Eosinophils Absolute: 0.1 K/uL (ref 0.0–0.5)
Eosinophils Relative: 1 %
HCT: 44.8 % (ref 36.0–46.0)
Hemoglobin: 14.5 g/dL (ref 12.0–15.0)
Immature Granulocytes: 0 %
Lymphocytes Relative: 40 %
Lymphs Abs: 2.9 K/uL (ref 0.7–4.0)
MCH: 33.5 pg (ref 26.0–34.0)
MCHC: 32.4 g/dL (ref 30.0–36.0)
MCV: 103.5 fL — ABNORMAL HIGH (ref 80.0–100.0)
Monocytes Absolute: 0.5 K/uL (ref 0.1–1.0)
Monocytes Relative: 7 %
Neutro Abs: 3.8 K/uL (ref 1.7–7.7)
Neutrophils Relative %: 51 %
Platelets: 317 K/uL (ref 150–400)
RBC: 4.33 MIL/uL (ref 3.87–5.11)
RDW: 12.8 % (ref 11.5–15.5)
WBC: 7.4 K/uL (ref 4.0–10.5)
nRBC: 0 % (ref 0.0–0.2)

## 2023-11-03 LAB — COMPREHENSIVE METABOLIC PANEL WITH GFR
ALT: 10 U/L (ref 0–44)
AST: 15 U/L (ref 15–41)
Albumin: 4.3 g/dL (ref 3.5–5.0)
Alkaline Phosphatase: 79 U/L (ref 38–126)
Anion gap: 9 (ref 5–15)
BUN: 14 mg/dL (ref 6–20)
CO2: 29 mmol/L (ref 22–32)
Calcium: 9.1 mg/dL (ref 8.9–10.3)
Chloride: 104 mmol/L (ref 98–111)
Creatinine, Ser: 1 mg/dL (ref 0.44–1.00)
GFR, Estimated: >60 mL/min (ref >60)
Glucose, Bld: 83 mg/dL (ref 70–99)
Potassium: 4 mmol/L (ref 3.5–5.1)
Sodium: 143 mmol/L (ref 135–145)
Total Bilirubin: 0.5 mg/dL (ref 0.0–1.2)
Total Protein: 7.3 g/dL (ref 6.5–8.1)

## 2023-11-03 LAB — LACTATE DEHYDROGENASE: LDH: 233 U/L — ABNORMAL HIGH (ref 98–192)

## 2023-11-03 MED ORDER — COSENTYX PEN 300 MG/2 PENS (150 MG/ML) SUBCUTANEOUS PEN INJECTOR
SUBCUTANEOUS | 11 refills | 28.00000 days | Status: CP
Start: 2023-11-03 — End: ?
  Filled 2023-12-14: qty 2, 28d supply, fill #0

## 2023-11-03 MED ORDER — CLOBETASOL 0.05 % TOPICAL OINTMENT
Freq: Two times a day (BID) | TOPICAL | 6 refills | 0.00000 days | Status: CP | PRN
Start: 2023-11-03 — End: 2024-11-02

## 2023-11-03 MED ORDER — TRIAMCINOLONE ACETONIDE 0.1 % TOPICAL OINTMENT
Freq: Two times a day (BID) | TOPICAL | 3 refills | 0.00000 days | Status: CP
Start: 2023-11-03 — End: 2024-11-02

## 2023-11-03 NOTE — Unmapped (Signed)
 Dermatology Note     Assessment and Plan:    Psoriasis, chronic, not at treatment goal  - Diagnosis and treatment options discussed; side effects and r/b benefits discussed today  - Continue triamcinolone  (KENALOG ) 0.1 % ointment; Apply topically two (2) times a day as needed.  - Continue: Clobetasol  (TEMOVATE ) 0.05 % ointment; Apply topically two (2) times a day as needed. Use on stubborn areas (feet, elbows, etc) until smooth.  - Recommended over-the-counter 40% urea  cream alternating with the clobetasol .  - Continue secukinumab  (COSENTYX  PEN, 2 PENS,) 150 mg/mL PnIj injection; Inject the contents of 2 pens (300 mg total) under the skin every twenty-eight (28) days. Maintenance dose.  - Patient continues to have disease activity on elbows; discussed switching to a different biologic, however patient is otherwise well controlled on Cosentyx  so would like to continue that for now; will let us  know in the future if disease activity worsens     High risk medication use:  - Recheck tuberculosis test today; ordered for [facility]. Was negative in June 2024.    The patient was advised to call for an appointment should any new, changing, or symptomatic lesions develop.     RTC: Return in about 1 year (around 11/02/2024) for Psoriasis. or sooner as needed   _________________________________________________________________      Chief Complaint     Chief Complaint   Patient presents with    Follow-up     Patient reports area of concern on both elbows  Patient reports needing refills on topical medication        HPI     Morgan Edwards is a 48 y.o. female who presents as a returning patient (last seen 04/21/2023) to Dermatology for follow up of psoriasis.    Today:  - Psoriasis well controlled on Cosentyx ; she is tolerating medication  - Continues to have psoriasis on elbows, but otherwise is clear  - Needs refills of topicals    The patient denies any other new or changing lesions or areas of concern.     Pertinent Past Medical History     Problem List          Musculoskeletal and Integument    Psoriasis    Relevant Medications    secukinumab  (COSENTYX  PEN, 2 PENS,) 150 mg/mL PnIj injection    clobetasol  (TEMOVATE ) 0.05 % ointment    triamcinolone  (KENALOG ) 0.1 % ointment     Past Medical History, Family History, Social History, Medication List, Allergies, and Problem List were reviewed in the rooming section of Epic.     ROS: Other than symptoms mentioned in the HPI, no fevers, chills, or other skin complaints    Physical Examination     GENERAL: Well-appearing female in no acute distress, resting comfortably.  NEURO: Alert and oriented, answers questions appropriately  PSYCH: Normal mood and affect  RESP: No increased work of breathing  SKIN: Examination of the scalp, face, upper chest, and bilateral upper extremities was performed  Well demarcated erythematous plaques with silvery scale on elbows    All areas not commented on are within normal limits or unremarkable      (Approved Template 09/26/2019)

## 2023-11-04 LAB — KAPPA/LAMBDA LIGHT CHAINS
Kappa free light chain: 21.9 mg/L — ABNORMAL HIGH (ref 3.3–19.4)
Kappa, lambda light chain ratio: 1.42 (ref 0.26–1.65)
Lambda free light chains: 15.4 mg/L (ref 5.7–26.3)

## 2023-11-05 LAB — PROTEIN ELECTROPHORESIS, SERUM
A/G Ratio: 1.1 (ref 0.7–1.7)
Albumin ELP: 3.5 g/dL (ref 2.9–4.4)
Alpha-1-Globulin: 0.3 g/dL (ref 0.0–0.4)
Alpha-2-Globulin: 0.9 g/dL (ref 0.4–1.0)
Beta Globulin: 1 g/dL (ref 0.7–1.3)
Gamma Globulin: 1 g/dL (ref 0.4–1.8)
Globulin, Total: 3.2 g/dL (ref 2.2–3.9)
M-Spike, %: 0.4 g/dL — ABNORMAL HIGH
Total Protein ELP: 6.7 g/dL (ref 6.0–8.5)
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
## 2023-11-10 ENCOUNTER — Inpatient Hospital Stay: Admitting: Physician Assistant

## 2023-11-10 VITALS — BP 151/88 | HR 79 | Temp 98.6°F | Resp 18 | Ht 69.0 in | Wt 323.0 lb

## 2023-11-10 DIAGNOSIS — D472 Monoclonal gammopathy: Secondary | ICD-10-CM

## 2023-11-10 DIAGNOSIS — Z79899 Other long term (current) drug therapy: Secondary | ICD-10-CM | POA: Diagnosis not present

## 2023-11-10 DIAGNOSIS — L4050 Arthropathic psoriasis, unspecified: Secondary | ICD-10-CM | POA: Diagnosis not present

## 2023-11-10 DIAGNOSIS — F17210 Nicotine dependence, cigarettes, uncomplicated: Secondary | ICD-10-CM | POA: Diagnosis not present

## 2023-11-10 DIAGNOSIS — Z21 Asymptomatic human immunodeficiency virus [HIV] infection status: Secondary | ICD-10-CM | POA: Diagnosis not present

## 2023-11-10 NOTE — Patient Instructions (Signed)
 [facility] **VISIT SUMMARY & IMPORTANT INSTRUCTIONS **   You were seen today by Pleasant Barefoot PA-C for your MGUS.   Your MGUS labs are stable.  You continue to have an abnormal protein (M spike and elevated kappa light chains), but these have not increased compared to your last labs. You do not have any evidence of multiple myeloma cancer at this time.  You are at low/intermediate risk of progression to multiple myeloma (approximately 20% risk of progression over the next 20 years, or 1% risk per year). Since your labs have been stable and you are at low/intermediate risk of progression, we will switch you to annual labs and office visit at this time.  You will not need any additional x-rays unless you have a change in your labs or symptoms.  FOLLOW-UP APPOINTMENT: Office visit in 1 year  ** Thank you for trusting me with your healthcare!  I strive to provide all of my patients with quality care at each visit.  If you receive a survey for this visit, I would be so grateful to you for taking the time to provide feedback.  Thank you in advance!  ~ Daxson Reffett                                        Dr. Mickiel Davonna Pleasant Barefoot, PA-C     Delon Hope, NP   - - - - - - - - - - - - - - - - - -     Thank you for choosing [facility] at [facility] to provide your oncology and hematology care.  To afford each patient quality time with our provider, please arrive at least 15 minutes before your scheduled appointment time.   If you have a lab appointment with the Cancer Center please come in thru the Main Entrance and check in at the main information desk.  You need to re-schedule your appointment should you arrive 10 or more minutes late.  We strive to give you quality time with our providers, and arriving late affects you and other patients whose appointments are after yours.  Also, if you no show three or more times for  appointments you may be dismissed from the clinic at the providers discretion.     Again, thank you for choosing [facility].  Our hope is that these requests will decrease the amount of time that you wait before being seen by our physicians.       _____________________________________________________________  Should you have questions after your visit to [facility], please contact our office at [phone] and follow the prompts.  Our office hours are 8:00 a.m. and 4:30 p.m. Monday - Friday.  Please note that voicemails left after 4:00 p.m. may not be returned until the following business day.  We are closed weekends and major holidays.  You do have access to a nurse 24-7, just call the main number to the clinic [phone] and do not press any options, hold on the line and a nurse will answer the phone.    For prescription refill requests, have your pharmacy contact our office and allow 72 hours.

## 2023-11-10 NOTE — Progress Notes (Signed)
 Jaidin Ugarte 's Cosentyx was last filled for an 41 day supply on 9/8 Rescheduling refill call for November.

## 2023-11-10 NOTE — Progress Notes (Signed)
 Morgan Edwards 's Cosentyx  was last filled for an 20 day supply on 9/8  Rescheduling refill call for November.

## 2023-11-12 LAB — QUANTIFERON TB GOLD PLUS
QUANTIFERON MITOGEN VALUE: >10 [IU]/mL
QUANTIFERON NIL VALUE: 0.02 [IU]/mL
QUANTIFERON TB GOLD: NEGATIVE
QUANTIFERON TB1 AG VALUE: 0.02 [IU]/mL
QUANTIFERON TB2 AG VALUE: 0.04 [IU]/mL

## 2023-11-22 ENCOUNTER — Other Ambulatory Visit: Payer: Self-pay | Admitting: Family

## 2023-11-24 ENCOUNTER — Ambulatory Visit: Admit: 2023-11-24 | Payer: Medicaid (Managed Care)

## 2023-11-24 DIAGNOSIS — R350 Frequency of micturition: Principal | ICD-10-CM

## 2023-11-24 DIAGNOSIS — N3941 Urge incontinence: Secondary | ICD-10-CM | POA: Diagnosis not present

## 2023-11-24 DIAGNOSIS — R2689 Other abnormalities of gait and mobility: Secondary | ICD-10-CM | POA: Diagnosis not present

## 2023-11-24 NOTE — Progress Notes (Signed)
 [facility] PHYSICAL THERAPY [place]  OUTPATIENT PHYSICAL THERAPY  11/24/2023  Note Type: Evaluation       Patient Name: Morgan Edwards  Date of Birth:1975-05-06  Diagnosis:   Encounter Diagnoses   Name Primary?    Urinary frequency Yes    Pelvic floor weakness      Referring Provider:  Osa Noretta BODILY     Date of Onset of Impairment-No date available  Date PT Care Plan Established or Reviewed-11/24/2023  Date PT Treatment Started-11/24/2023   Visit Count: 1  Plan of Care Effective Date: 11/24/2023 - 01/22/2024     1 of 16 visits, Reassessment due 12/24/2023     Assessment/Plan:    Assessment  Assessment details:    Impairments indicating medical necessity and functional limitations include: significant pelvic floor and abdominal mm weakness and incoordination, mildly limited ROM, balance and gait impairments with urinary incontinence that is limiting pt ability to complete basic ADL's and home/community ambulation without difficulty. Patient will benefit from skilled PT intervention to address current body structure impairments and activity limitations to return to functions of  independence with transfers, ADLs, and gt.       Impairments: core weakness, impaired ADLs, impaired coordination, urinary incontinence and pain        Prognosis: good prognosis      Specific Comorbidities: HTN, OSA, DM2, psoriasis, GERD    Examination of Body Systems: activity/participation, musculoskeletal, neurological and urogenital    Clinical Decision Making: moderate    Positive Prognosis Rationale: motivated for treatment and safety awareness.  Negative Prognosis Rationale: body habitus.    Clinical Presentation: stable    Therapy Goals      Goals:      Patient Goals: to strengthen pelvic floor to decrease incontinence     Short Term Goals to be reached in 4 weeks:   Pt will demonstrate:  1-Independence with initial HEP for self-management of sxs.  2-Understanding of and need for consistency with behavioral changes such as but not limited to appropriate hydration, potential changes to diet, routine diaphragmatic breathing, daily HEP practice, improved defecation mechanics and voiding habits including use of squatty potty for voiding, relaxation exercises, mindfulness activities, and use of physical supports.  3-Levator ani strength >/= 3/5 with >/= 5 second hold followed by complete relaxation for improved pelvic floor muscle strength and coordination, for improved closure of sphincters to progress toward preventing incontinence.  4-Rising no more than 2 times per night to urinate.    Long Term Goals to be reached in 12 weeks:   Pt will demonstrate:  1-Independence with HEP for self-management of sxs.  2-Rising no more than 1 time per night to urinate.  3-Urinary incontinence prevented 90% of time with cough/laugh/sneeze/lift for improved quality of life, skin integrity, and return to prior level of function.  4-Levator ani strength >/= 4/5 with >/= 10 second hold followed by complete relaxation for improved pelvic floor muscle strength and coordination, for improved closure of sphincters to prevent incontinence.    Plan    Therapy options: will be seen for skilled physical therapy services    Planned therapy interventions: 20560, 20561-Dry Needling 1-2, 3+ areas, 97010-Cold Packs/Hot Packs, 02967, G0283-Electrical Stimulation (unattended, attended), 97110-Therapeutic Exercises, 97112-Neuromuscular Re-education, 97140-Manual Therapy, 97530-Therapeutic Activities and 97750-Physical Performance Test      Frequency: 1-2x/week.    Duration in weeks: 8-12    Education provided to: patient.    Education provided: Anatomy, HEP, LA contraction to prevent SUI, Body awareness, Fluid intake and  Treatment options and plan    Education results: verbalized good understanding and demonstrates understanding.    Communication/Consultation: Initial note sent to Referring Provider.    Next visit plan:        TA ex ed  Progress w core and PFM, TA strengthening  Dry needling for PTNS if desired     Total Treatment Time: 62    Total Timed Code Minutes: 30    Treatment rendered today:      Neuromuscular Reed: 30 minutes  -Pt ed in PFM mm weakness and POC for decreasing UI  -Pt ed in PFM contraction in hook lying, verbal and tactile cueing required  -Coordination of PFM contraction with breathing  -Introduced TA contraction in hook lying, verbal and tactile cueing required, will req further practice  -Pelvic bracing  -Increasing awareness of PFM   -Supine overflow exercises incl:  -Hook lying Bridges w PFM  -Hook lying isometric bil hip adduction w PFM contraction   -Hook lying PFM w palm press and chin tuck  -Home exercise program instruction   Pt was instructed in above exercises and provided w verbal and visual cueing required for follow through. Pt was also instructed in home exercise program and was provided w printed instructions for home compliance.    Plan details: Therapeutic exercise, therapeutic activities, Neuromuscular Re-education, gt training, work conditioning/Job simulation, and pt education to improve independence and safety with gt, ADLs, and self care. Modalities (heat, ice, ultrasound, electrical stimulation, dry needling) as needed for pain management or muscle retraining. Massage, mobilization and manual therapy as needed to increase ROM in restricted soft tissues and joints.       Subjective:     History of Present Condition     History of Present Condition/Chief Complaint:  Pt is a 48 yo female referred to pelvic floor PT for frequency of urination.  Subjective:  Pt reports urinary leaking at various times throughout the day and frequently barely making it to the bathroom in time.   Pt reports always feeling a heaviness in lower abdomen, pressure vaginally, but no pain.  Pain:     Current pain rating:  3    At best pain rating:  3    At worst pain rating:  5  Location:  Vaginal, lower abdomen    Quality:  Pressure    Aggravating factors: As the day progresses    Pain related Behaviors:  None    Progression:  Worsening    Red Flags:  None  Precautions/Equipment   Precautions:  None    Current Braces/Orthoses:  None    Equipment Currently Used:  None  Prior Functional Status     No physical limitations    Current Functional Status:    disturbed sleep, leisure activities, limited exercise, limited recreation, limited walking tolerance, limited household activities, limited travel and limited work capacity  Social Support:     Lives with:  Spouse  Barriers to Learning:  No Barriers    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 Leaks at least one time per day.  Pt reports  average void time about 45 minutes, not as frequently later in the day.   Feels like she does not empty fully. Frequently has to urinate again very soon after urinating.   Pt has had 3 babies, all weighing greater than 8 pounds, all born vaginally.   Sexually active w spouse, feels loose w difficulty w climax  Daily fluid intake: 48 oz water, 32 oz carbonated drink     Pelvic Floor  External exam:  Skin integrity sl atrophic, no scar, Introitus normal, neurologic normal  Tone WNL, no pain w palpation BLEs, gluts, piriformis, lower abdomen  Internal Vaginal Exam:  Sensation WNL, muscle tone WNL  Vaginal muscle strength based on digital palpation: Bulbo: 2/5, LA 1/5  Pt can hold contraction for 2 seconds, 2 times consecutively  No pain reported on initial penetration during digital exam  No pain with palpation  Coordination w TA: not addressed on initial eval  Coordination w diaphragm: poor initially, fair w verbal and tactile cueing     Pelvic Outcome Measures:  Pelvic Floor Disability Index score = 13  --Pelvic Organ Prolapse Distress Inventory score 6/24 indicating 25% impairment  --Colorectal-Anal Distress Inventory score 0  --Urinary Distress Inventory score 7/24 indicating 29% impairment   Incontinence Impact Questionnaire score 2/21            PT Evaluation Charges  $$ 97162 - PT Evaluation - MOD Complexity [mins]: 32                       I attest that I have reviewed the above information.  Signed: Sari GORMAN Minder, PT  11/24/2023 11:11 PM

## 2023-12-01 DIAGNOSIS — R350 Frequency of micturition: Principal | ICD-10-CM

## 2023-12-01 DIAGNOSIS — N8189 Other female genital prolapse: Principal | ICD-10-CM

## 2023-12-01 DIAGNOSIS — N3941 Urge incontinence: Secondary | ICD-10-CM | POA: Diagnosis not present

## 2023-12-01 DIAGNOSIS — R2689 Other abnormalities of gait and mobility: Secondary | ICD-10-CM | POA: Diagnosis not present

## 2023-12-01 NOTE — Progress Notes (Signed)
 [facility] PHYSICAL THERAPY [place]  OUTPATIENT PHYSICAL THERAPY  12/01/2023  Note Type: Treatment Note       Patient Name: Morgan Edwards  Date of Birth:1975/04/13  Diagnosis:   Encounter Diagnoses   Name Primary?   ??? Urinary frequency Yes   ??? Pelvic floor weakness      Referring Provider:  Osa Noretta BODILY     Date of Onset of Impairment-No date available  Date PT Care Plan Established or Reviewed-11/25/2023  Date PT Treatment Started-11/24/2023   Visit Count: 2  Plan of Care Effective Date: 11/24/2023 - 01/22/2024     2 of 16 visits, Reassessment due 12/24/2023     Assessment/Plan:    Assessment  Assessment details:    Pt tolerated treatment well with progression of core and TA/PFM strengthening with added exercises in standing and functional postures with report of no pain. Pt demonstrates good understanding of updates to HEP and in need for full PFM relaxation between each repetition of OFM exercise to prevent soreness.    Impairments indicating medical necessity and functional limitations include: significant pelvic floor and abdominal mm weakness and incoordination, mildly limited ROM, balance and gait impairments with urinary incontinence that is limiting pt ability to complete basic ADL's and home/community ambulation without difficulty. Patient will benefit from skilled PT intervention to address current body structure impairments and activity limitations to return to functions of????independence with transfers, ADLs, and gt.       Impairments: core weakness, impaired ADLs, impaired coordination, urinary incontinence and pain        Prognosis: good prognosis      Specific Comorbidities: HTN, OSA, DM2, psoriasis, GERD    Examination of Body Systems: activity/participation, musculoskeletal, neurological and urogenital    Clinical Decision Making: moderate    Positive Prognosis Rationale: motivated for treatment and safety awareness.  Negative Prognosis Rationale: body habitus.    Clinical Presentation: stable    Therapy Goals      Goals:      Patient Goals: to strengthen pelvic floor to decrease incontinence     Short Term Goals to be reached in 4 weeks:   Pt will demonstrate:  1-Independence with initial HEP for self-management of sxs.  2-Understanding of and need for consistency with behavioral changes such as but not limited to appropriate hydration, potential changes to diet, routine diaphragmatic breathing, daily HEP practice, improved defecation mechanics and voiding habits including use of squatty potty for voiding, relaxation exercises, mindfulness activities, and use of physical supports.  3-Levator ani strength >/= 3/5 with >/= 5 second hold followed by complete relaxation for improved pelvic floor muscle strength and coordination, for improved closure of sphincters to progress toward preventing incontinence.  4-Rising no more than 2 times per night to urinate.    Long Term Goals to be reached in 12 weeks:   Pt will demonstrate:  1-Independence with HEP for self-management of sxs.  2-Rising no more than 1 time per night to urinate.  3-Urinary incontinence prevented 90% of time with cough/laugh/sneeze/lift for improved quality of life, skin integrity, and return to prior level of function.  4-Levator ani strength >/= 4/5 with >/= 10 second hold followed by complete relaxation for improved pelvic floor muscle strength and coordination, for improved closure of sphincters to prevent incontinence.    Plan    Therapy options: will be seen for skilled physical therapy services    Planned therapy interventions: 20560, 20561-Dry Needling 1-2, 3+ areas, 97010-Cold Packs/Hot Packs, 02967, G0283-Electrical Stimulation (unattended, attended),  97110-Therapeutic Exercises, 97112-Neuromuscular Re-education, 97140-Manual Therapy, 97530-Therapeutic Activities and 97750-Physical Performance Test      Frequency: 1-2x/week.    Duration in weeks: 8-12    Education provided to: patient.    Education provided: Anatomy, HEP, LA contraction to prevent SUI, Body awareness, Fluid intake and Treatment options and plan    Education results: verbalized good understanding and demonstrates understanding.    Communication/Consultation: Initial note sent to Referring Provider.    Next visit plan:        Progress w core and PFM, TA strengthening  Dry needling for PTNS if desired     Total Treatment Time: 55    Total Timed Code Minutes: 55    Treatment rendered today:      TE: 55 minutes  -Pt ed in TA contraction in hook lying, verbal and tactile cueing required  -Coordination of PFM contraction with TA contraction in hook lying  -Standing on Quantum w BUE support, PFM/TA in coordination w hip flexion, hip abduction 30# resistance 10 reps each  -PFM/TA activation w 6 step ups forward w BUE support 10 reps each LE  -PFM/TA activation w bil shld extension w red theraband resistance 10 reps w 3 sec holds  -Recumbent bike level 5 x 10 min    Reviewed for HEP:  -Pelvic bracing  -Increasing awareness of PFM   -Supine overflow exercises incl:  -Hook lying Bridges w PFM  -Hook lying isometric bil hip adduction w PFM contraction   -Hook lying PFM w palm press and chin tuck    Pt was instructed in above exercises and provided w verbal and visual cueing required for follow through. Pt was also instructed in additions to home exercise program and was provided w printed instructions for home compliance.    Plan details: Therapeutic exercise, therapeutic activities, Neuromuscular Re-education, gt training, work conditioning/Job simulation, and pt education to improve independence and safety with gt, ADLs, and self care. Modalities (heat, ice, ultrasound, electrical stimulation, dry needling) as needed for pain management or muscle retraining. Massage, mobilization and manual therapy as needed to increase ROM in restricted soft tissues and joints.       Subjective:     History of Present Condition     History of Present Condition/Chief Complaint:  Pt is a 48 yo female referred to pelvic floor PT for frequency of urination.  Subjective:  12/01/23 Pt repots compliance w HEP, has made her a little sore vaginally. Discussed doing PFM contractions at submaximal level and allow for full relaxation between each     On initial eval:  Pt reports urinary leaking at various times throughout the day and frequently barely making it to the bathroom in time.   Pt reports always feeling a heaviness in lower abdomen, pressure vaginally, but no pain.  Pain:     Current pain rating:  3    At best pain rating:  3    At worst pain rating:  5  Location:  Vaginal, lower abdomen    Quality:  Pressure    Aggravating factors:  As the day progresses    Pain related Behaviors:  None    Progression:  Worsening    Red Flags:  None  Precautions/Equipment   Precautions:  None    Current Braces/Orthoses:  None    Equipment Currently Used:  None  Prior Functional Status     No physical limitations    Current Functional Status:    disturbed sleep, leisure activities, limited exercise, limited  recreation, limited walking tolerance, limited household activities, limited travel and limited work capacity  Social Support:     Lives with:  Spouse  Barriers to Learning:  No Barriers    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 Leaks at least one time per day.  Pt reports average void time about 45 minutes, not as frequently later in the day.   Feels like she does not empty fully. Frequently has to urinate again very soon after urinating.   Pt has had 3 babies, all weighing greater than 8 pounds, all born vaginally.   Sexually active w spouse, feels loose w difficulty w climax  Daily fluid intake: 48 oz water, 32 oz carbonated drink     Pelvic Floor  External exam:  Skin integrity sl atrophic, no scar, Introitus normal, neurologic normal  Tone WNL, no pain w palpation BLEs, gluts, piriformis, lower abdomen  Internal Vaginal Exam:  Sensation WNL, muscle tone WNL  Vaginal muscle strength based on digital palpation: Bulbo: 2/5, LA 1/5  Pt can hold contraction for 2 seconds, 2 times consecutively  No pain reported on initial penetration during digital exam  No pain with palpation  Coordination w TA: not addressed on initial eval  Coordination w diaphragm: poor initially, fair w verbal and tactile cueing     Pelvic Outcome Measures:  Pelvic Floor Disability Index score = 13  --Pelvic Organ Prolapse Distress Inventory score 6/24 indicating 25% impairment  --Colorectal-Anal Distress Inventory score 0  --Urinary Distress Inventory score 7/24 indicating 29% impairment   Incontinence Impact Questionnaire score 2/21                                    I attest that I have reviewed the above information.  Signed: Sari GORMAN Minder, PT  12/01/2023 5:38 PM

## 2023-12-08 MED ORDER — EMPTY CONTAINER
2 refills | 0.00000 days
Start: 2023-12-08 — End: ?

## 2023-12-08 NOTE — Progress Notes (Signed)
 [facility] [place] [facility] Refill Coordination Note    Specialty Medication(s) to be Shipped:   Inflammatory Disorders: Cosentyx     Other medication(s) to be shipped: No additional medications requested for fill at this time    Specialty Medications not needed at this time: N/A     Morgan Edwards, DOB: 06/22/1975  Phone: [phone] (home)       All above HIPAA information was verified with patient.     Was a nurse, learning disability used for this call? No    Completed refill call assessment today to schedule patient's medication shipment from the [facility]  [phone]).  All relevant notes have been reviewed.     Specialty medication(s) and dose(s) confirmed: Regimen is correct and unchanged.   Changes to medications: start rybelsus  Changes to insurance: No  New side effects reported not previously addressed with a pharmacist or physician: None reported  Questions for the pharmacist: No    Confirmed patient received a Conservation Officer, Historic Buildings and a Surveyor, Mining with first shipment. The patient will receive a drug information handout for each medication shipped and additional FDA Medication Guides as required.       DISEASE/MEDICATION-SPECIFIC INFORMATION        For patients on injectable medications: Next injection is scheduled for 12/21/23.    SPECIALTY MEDICATION ADHERENCE     Medication Adherence    Patient reported X missed doses in the last month: 0  Specialty Medication: COSENTYX  PEN (2 PENS) 150 mg/mL Pnij injection (secukinumab )  Patient is on additional specialty medications: No              Were doses missed due to medication being on hold? No      COSENTYX  PEN (2 PENS) 150 mg/mL Pnij injection (secukinumab ): 0 days of medicine on hand     REFERRAL TO PHARMACIST     Referral to the pharmacist: Not needed      [facility]     Shipping address confirmed in Epic.     Cost and Payment: Patient has a copay of $4.00. They are aware and have authorized the pharmacy to charge the credit card on file.    Delivery Scheduled: Yes, Expected medication delivery date: 12/15/23.     Medication will be delivered via UPS to the prescription address in Epic WAM.    [facility] [place] [facility]  Specialty Technician

## 2023-12-14 MED FILL — EMPTY CONTAINER: 120 days supply | Qty: 1 | Fill #0

## 2023-12-24 DIAGNOSIS — Z6841 Body Mass Index (BMI) 40.0 and over, adult: Secondary | ICD-10-CM | POA: Diagnosis not present

## 2023-12-24 DIAGNOSIS — J029 Acute pharyngitis, unspecified: Secondary | ICD-10-CM | POA: Diagnosis not present

## 2023-12-24 DIAGNOSIS — I10 Essential (primary) hypertension: Secondary | ICD-10-CM | POA: Diagnosis not present

## 2023-12-24 DIAGNOSIS — J019 Acute sinusitis, unspecified: Secondary | ICD-10-CM | POA: Diagnosis not present

## 2023-12-24 DIAGNOSIS — R0981 Nasal congestion: Secondary | ICD-10-CM | POA: Diagnosis not present

## 2023-12-24 DIAGNOSIS — J00 Acute nasopharyngitis [common cold]: Secondary | ICD-10-CM | POA: Diagnosis not present

## 2023-12-28 ENCOUNTER — Telehealth: Payer: Self-pay

## 2023-12-28 ENCOUNTER — Other Ambulatory Visit: Payer: Self-pay | Admitting: Family

## 2023-12-28 DIAGNOSIS — Z1231 Encounter for screening mammogram for malignant neoplasm of breast: Secondary | ICD-10-CM

## 2023-12-28 NOTE — Telephone Encounter (Signed)
 Copied from CRM [id number]. Topic: Clinical - Medical Advice >> Dec 28, 2023 10:20 AM Carrielelia G wrote: Reason for CRM: Attn: Jasmine Peck is asking for a call back regarding Medical (Declined to give any other information)    Please advise

## 2023-12-28 NOTE — Telephone Encounter (Signed)
 Pt returned call.  Jasmine Peck was in a room with a patient and will call her back.

## 2023-12-28 NOTE — Telephone Encounter (Signed)
 Called and spoke with patient appt made. She just wanted an appt and a letter written for court.

## 2023-12-28 NOTE — Telephone Encounter (Signed)
 Left message to call back.

## 2023-12-29 ENCOUNTER — Encounter: Payer: Self-pay | Admitting: Family

## 2023-12-29 ENCOUNTER — Ambulatory Visit: Admitting: Family

## 2023-12-29 VITALS — BP 150/86 | HR 85 | Temp 97.5°F | Ht 69.0 in | Wt 327.0 lb

## 2023-12-29 DIAGNOSIS — I152 Hypertension secondary to endocrine disorders: Secondary | ICD-10-CM

## 2023-12-29 DIAGNOSIS — E1169 Type 2 diabetes mellitus with other specified complication: Secondary | ICD-10-CM

## 2023-12-29 DIAGNOSIS — L409 Psoriasis, unspecified: Secondary | ICD-10-CM

## 2023-12-29 DIAGNOSIS — B20 Human immunodeficiency virus [HIV] disease: Secondary | ICD-10-CM | POA: Diagnosis not present

## 2023-12-29 DIAGNOSIS — Z6841 Body Mass Index (BMI) 40.0 and over, adult: Secondary | ICD-10-CM | POA: Diagnosis not present

## 2023-12-29 DIAGNOSIS — E1159 Type 2 diabetes mellitus with other circulatory complications: Secondary | ICD-10-CM | POA: Diagnosis not present

## 2023-12-29 DIAGNOSIS — M5412 Radiculopathy, cervical region: Secondary | ICD-10-CM | POA: Diagnosis not present

## 2023-12-29 DIAGNOSIS — E785 Hyperlipidemia, unspecified: Secondary | ICD-10-CM | POA: Diagnosis not present

## 2023-12-29 DIAGNOSIS — M542 Cervicalgia: Secondary | ICD-10-CM

## 2023-12-29 DIAGNOSIS — F411 Generalized anxiety disorder: Secondary | ICD-10-CM

## 2023-12-29 DIAGNOSIS — F331 Major depressive disorder, recurrent, moderate: Secondary | ICD-10-CM | POA: Diagnosis not present

## 2023-12-29 DIAGNOSIS — E1142 Type 2 diabetes mellitus with diabetic polyneuropathy: Secondary | ICD-10-CM

## 2023-12-29 DIAGNOSIS — F17200 Nicotine dependence, unspecified, uncomplicated: Secondary | ICD-10-CM

## 2023-12-29 DIAGNOSIS — F4310 Post-traumatic stress disorder, unspecified: Secondary | ICD-10-CM

## 2023-12-29 DIAGNOSIS — K219 Gastro-esophageal reflux disease without esophagitis: Secondary | ICD-10-CM

## 2023-12-29 DIAGNOSIS — F410 Panic disorder [episodic paroxysmal anxiety] without agoraphobia: Secondary | ICD-10-CM

## 2023-12-29 LAB — BAYER DCA HB A1C WAIVED: HB A1C (BAYER DCA - WAIVED): 5.9 % — ABNORMAL HIGH (ref 4.8–5.6)

## 2023-12-29 MED ORDER — CETIRIZINE HCL 10 MG PO TABS: 10.0000 mg | ORAL_TABLET | Freq: Every day | ORAL | 1 refills | Status: AC

## 2023-12-29 MED ORDER — FUROSEMIDE 20 MG PO TABS: 20.0000 mg | ORAL_TABLET | Freq: Every day | ORAL | 1 refills | Status: AC

## 2023-12-29 MED ORDER — GABAPENTIN 600 MG PO TABS: 600.0000 mg | ORAL_TABLET | Freq: Three times a day (TID) | ORAL | 5 refills | Status: AC

## 2023-12-29 MED ORDER — CLOBETASOL PROPIONATE 0.05 % EX OINT: TOPICAL_OINTMENT | Freq: Two times a day (BID) | CUTANEOUS | 2 refills | Status: AC

## 2023-12-29 MED ORDER — BUSPIRONE HCL 7.5 MG PO TABS: 7.5000 mg | ORAL_TABLET | Freq: Three times a day (TID) | ORAL | 1 refills | Status: AC | PRN

## 2023-12-29 MED ORDER — OMEPRAZOLE 20 MG PO CPDR: DELAYED_RELEASE_CAPSULE | ORAL | 1 refills | Status: AC

## 2023-12-29 MED ORDER — ALPRAZOLAM 0.5 MG PO TABS: 0.5000 mg | ORAL_TABLET | Freq: Every evening | ORAL | 1 refills | Status: AC | PRN

## 2023-12-29 MED ORDER — PRAZOSIN HCL 2 MG PO CAPS: 2.0000 mg | ORAL_CAPSULE | Freq: Every day | ORAL | 1 refills | Status: AC

## 2023-12-29 MED ORDER — LISINOPRIL 40 MG PO TABS: 40.0000 mg | ORAL_TABLET | Freq: Every day | ORAL | 3 refills | Status: AC

## 2023-12-29 MED ORDER — ROSUVASTATIN CALCIUM 5 MG PO TABS: 5.0000 mg | ORAL_TABLET | ORAL | 3 refills | Status: AC

## 2023-12-29 MED ORDER — DULOXETINE HCL 30 MG PO CPEP: 30.0000 mg | ORAL_CAPSULE | Freq: Every day | ORAL | 1 refills | Status: AC

## 2023-12-29 MED ORDER — FLUTICASONE PROPIONATE 50 MCG/ACT NA SUSP: 2.0000 | Freq: Every day | NASAL | 1 refills | Status: AC

## 2023-12-29 MED ORDER — AMLODIPINE BESYLATE 10 MG PO TABS: 10.0000 mg | ORAL_TABLET | Freq: Every day | ORAL | 3 refills | Status: AC

## 2023-12-29 MED ORDER — CELECOXIB 200 MG PO CAPS: 200.0000 mg | ORAL_CAPSULE | Freq: Two times a day (BID) | ORAL | 1 refills | Status: AC

## 2023-12-29 NOTE — Patient Instructions (Signed)
Managing Post-Traumatic Stress Disorder If you have been diagnosed with post-traumatic stress disorder (PTSD), you may be relieved that you now know why you have felt or behaved a certain way. Still, you may feel overwhelmed and concerned for your future.  With the right treatment and support, you can live your life with fewer symptoms. What actions can I take to manage PTSD? Manage stress Stress is your body's reaction to life changes and events, both good and bad. Stress can make PTSD worse. Take the following steps to manage stress: Talk with your health care provider or a counselor about ways to lower your stress. These may include: Physical exercise. Meditation, yoga, or other mind-body exercises. Exercises to relax your muscles. Breathing exercises. Listening to quiet music. Spending time outside. Eat a healthy diet. Get plenty of sleep. Take time to relax. Spend time with others. Talk with them about how you are feeling and what you need. Do activities that you enjoy. Have a schedule and take regular breaks. Take your medicines Symptoms of PTSD can make you feel depressed and anxious. They can also disrupt reality. Medicines can help lessen symptoms and make you feel better. It is important to: Talk with your pharmacist or health care provider about all medicines you take. Talk about the side effects. Discuss which medicines are safe to take together. Be involved in your care. Decide on the best treatment plan with your health care provider. Be aware that if you decide to take medicine, it can take 4-8 weeks before you notice a difference in your symptoms. Talk to your health care provider before stopping medicines. Stay close to family and friends PTSD can affect relationships. Make an effort to: Trust your friends and loved ones. Trusting others can help you feel safe and connect you with emotional support. Be open and honest about your feelings. Have fun and relax in safe  spaces, such as with friends and family. Think about going to couples counseling, family education classes, or family therapy. Therapy can be helpful for everyone. Talk with friends and family about how they can best support you. What are signs that my condition is getting worse? Be aware of your symptoms and how often you have them. The following symptoms mean that you may need additional help: You feel suspicious and angry. You have repeated flashbacks. You avoid going out or being with others. You have more fights with close friends or family members. You have thoughts about hurting yourself or others. You cannot get relief from feelings of depression or anxiety. Follow these instructions at home: Lifestyle Exercise at least 30 minutes a few times a week. Try to get 7-9 hours of sleep each night. To help with sleep: Keep your bedroom cool and dark. Do not eat a heavy meal within 1 hour of bedtime. Avoid caffeine for at least 8 hours before bed. Avoid screen time before bed. This includes television, computers, tablets, and mobile phones. Try to have fun and find humor in your life. Eat a healthy diet. Write your thoughts and feelings down in a journal or on a tablet or mobile phone. General instructions Take over-the-counter and prescription medicines only as told by your health care provider. Do not useillegal drugs. Know and remember that healing from trauma takes time. Keep your health care team up to date about your PTSD symptoms and treatment. Alcohol use Do not drink alcohol if: Your health care provider tells you not to drink. You are pregnant, may be pregnant, or are   planning to become pregnant. If you drink alcohol: Limit how much you have to: 0-1 drink a day for women. 0-2 drinks a day for men. Know how much alcohol is in your drink. In the [place] one drink equals one 12 oz bottle of beer (355 mL), one 5 oz glass of wine (148 mL), or one 1 oz glass of hard liquor (44  mL). Where to find more information For more information about PTSD, treatment, and how to get support, go to: National Institute of Mental Health: nimh.nih.gov National Center for PTSD: ptsd.va.gov Contact a health care provider if: Your symptoms get worse or do not get better. You have trouble doing daily tasks. You have loss of appetite. You have trouble sleeping. Get help right away if: You have thoughts about hurting yourself or others. Get help right away if you feel like you may hurt yourself or others, or have thoughts about taking your own life. Go to your nearest emergency room or: Call 911. Call the National Suicide Prevention Lifeline at 1-800-273-8255 or 988. This is open 24 hours a day. Text the Crisis Text Line at 741741. Summary With the right treatment and support, you can live your life with fewer symptoms. Find supportive people and settings. Spend time in those places. Keep in contact with those people. Work with your health care team to create a plan to manage the symptoms of PTSD. The plan should include counseling, good habits, and techniques to reduce stress. This information is not intended to replace advice given to you by your health care provider. Make sure you discuss any questions you have with your health care provider. Document Revised: 04/19/2021 Document Reviewed: 04/19/2021 Elsevier Patient Education  2024 Elsevier Inc.  
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
## 2023-12-30 DIAGNOSIS — R030 Elevated blood-pressure reading, without diagnosis of hypertension: Secondary | ICD-10-CM | POA: Diagnosis not present

## 2023-12-30 DIAGNOSIS — R0981 Nasal congestion: Secondary | ICD-10-CM | POA: Diagnosis not present

## 2023-12-30 DIAGNOSIS — Z6841 Body Mass Index (BMI) 40.0 and over, adult: Secondary | ICD-10-CM | POA: Diagnosis not present

## 2023-12-30 DIAGNOSIS — J019 Acute sinusitis, unspecified: Secondary | ICD-10-CM | POA: Diagnosis not present

## 2023-12-30 DIAGNOSIS — R059 Cough, unspecified: Secondary | ICD-10-CM | POA: Diagnosis not present

## 2023-12-30 DIAGNOSIS — J209 Acute bronchitis, unspecified: Secondary | ICD-10-CM | POA: Diagnosis not present

## 2023-12-30 DIAGNOSIS — J00 Acute nasopharyngitis [common cold]: Secondary | ICD-10-CM | POA: Diagnosis not present

## 2023-12-30 LAB — CMP14+EGFR
ALT: 14 IU/L (ref 0–32)
AST: 10 IU/L (ref 0–40)
Albumin: 4.4 g/dL (ref 3.9–4.9)
Alkaline Phosphatase: 85 IU/L (ref 41–116)
BUN/Creatinine Ratio: 18 (ref 9–23)
BUN: 15 mg/dL (ref 6–24)
Bilirubin Total: 0.3 mg/dL (ref 0.0–1.2)
CO2: 26 mmol/L (ref 20–29)
Calcium: 9.2 mg/dL (ref 8.7–10.2)
Chloride: 102 mmol/L (ref 96–106)
Creatinine, Ser: 0.85 mg/dL (ref 0.57–1.00)
Globulin, Total: 2.6 g/dL (ref 1.5–4.5)
Glucose: 108 mg/dL — ABNORMAL HIGH (ref 70–99)
Potassium: 4.5 mmol/L (ref 3.5–5.2)
Sodium: 141 mmol/L (ref 134–144)
Total Protein: 7 g/dL (ref 6.0–8.5)
eGFR: 84 mL/min/1.73 (ref >59)

## 2023-12-30 LAB — CBC WITH DIFFERENTIAL/PLATELET
Basophils Absolute: 0.1 x10E3/uL (ref 0.0–0.2)
Basos: 1 %
EOS (ABSOLUTE): 0.1 x10E3/uL (ref 0.0–0.4)
Eos: 1 %
Hematocrit: 44 % (ref 34.0–46.6)
Hemoglobin: 14.8 g/dL (ref 11.1–15.9)
Immature Grans (Abs): 0 x10E3/uL (ref 0.0–0.1)
Immature Granulocytes: 0 %
Lymphocytes Absolute: 3.4 x10E3/uL — ABNORMAL HIGH (ref 0.7–3.1)
Lymphs: 41 %
MCH: 33.8 pg — ABNORMAL HIGH (ref 26.6–33.0)
MCHC: 33.6 g/dL (ref 31.5–35.7)
MCV: 101 fL — ABNORMAL HIGH (ref 79–97)
Monocytes Absolute: 0.5 x10E3/uL (ref 0.1–0.9)
Monocytes: 6 %
Neutrophils Absolute: 4.2 x10E3/uL (ref 1.4–7.0)
Neutrophils: 51 %
Platelets: 351 x10E3/uL (ref 150–450)
RBC: 4.38 x10E6/uL (ref 3.77–5.28)
RDW: 12.7 % (ref 11.7–15.4)
WBC: 8.2 x10E3/uL (ref 3.4–10.8)

## 2023-12-31 ENCOUNTER — Ambulatory Visit: Payer: Self-pay | Admitting: Family

## 2024-01-04 ENCOUNTER — Ambulatory Visit (INDEPENDENT_AMBULATORY_CARE_PROVIDER_SITE_OTHER): Admitting: Licensed Practical Nurse

## 2024-01-04 ENCOUNTER — Other Ambulatory Visit (HOSPITAL_COMMUNITY)
Admission: RE | Admit: 2024-01-04 | Discharge: 2024-01-04 | Disposition: A | Discharge status: Home / Self Care | Source: Ambulatory Visit | Attending: Licensed Practical Nurse | Admitting: Licensed Practical Nurse

## 2024-01-04 ENCOUNTER — Encounter: Payer: Self-pay | Admitting: Licensed Practical Nurse

## 2024-01-04 VITALS — BP 145/93 | HR 79 | Ht 69.0 in | Wt 324.2 lb

## 2024-01-04 DIAGNOSIS — Z124 Encounter for screening for malignant neoplasm of cervix: Secondary | ICD-10-CM | POA: Insufficient documentation

## 2024-01-04 DIAGNOSIS — Z01419 Encounter for gynecological examination (general) (routine) without abnormal findings: Secondary | ICD-10-CM

## 2024-01-04 NOTE — Patient Instructions (Signed)
 Preventive Care 58-48 Years Old, Female  Preventive care refers to lifestyle choices and visits with your health care provider that can promote health and wellness. Preventive care visits are also called wellness exams.  What can I expect for my preventive care visit?  Counseling  Your health care provider may ask you questions about your:  Medical history, including:  Past medical problems.  Family medical history.  Pregnancy history.  Current health, including:  Menstrual cycle.  Method of birth control.  Emotional well-being.  Home life and relationship well-being.  Sexual activity and sexual health.  Lifestyle, including:  Alcohol, nicotine or tobacco, and drug use.  Access to firearms.  Diet, exercise, and sleep habits.  Work and work Astronomer.  Sunscreen use.  Safety issues such as seatbelt and bike helmet use.  Physical exam  Your health care provider will check your:  Height and weight. These may be used to calculate your BMI (body mass index). BMI is a measurement that tells if you are at a healthy weight.  Waist circumference. This measures the distance around your waistline. This measurement also tells if you are at a healthy weight and may help predict your risk of certain diseases, such as type 2 diabetes and high blood pressure.  Heart rate and blood pressure.  Body temperature.  Skin for abnormal spots.  What immunizations do I need?    Vaccines are usually given at various ages, according to a schedule. Your health care provider will recommend vaccines for you based on your age, medical history, and lifestyle or other factors, such as travel or where you work.  What tests do I need?  Screening  Your health care provider may recommend screening tests for certain conditions. This may include:  Lipid and cholesterol levels.  Diabetes screening. This is done by checking your blood sugar (glucose) after you have not eaten for a while (fasting).  Pelvic exam and Pap test.  Hepatitis B test.  Hepatitis C  test.  HIV (human immunodeficiency virus) test.  STI (sexually transmitted infection) testing, if you are at risk.  Lung cancer screening.  Colorectal cancer screening.  Mammogram. Talk with your health care provider about when you should start having regular mammograms. This may depend on whether you have a family history of breast cancer.  BRCA-related cancer screening. This may be done if you have a family history of breast, ovarian, tubal, or peritoneal cancers.  Bone density scan. This is done to screen for osteoporosis.  Talk with your health care provider about your test results, treatment options, and if necessary, the need for more tests.  Follow these instructions at home:  Eating and drinking    Eat a diet that includes fresh fruits and vegetables, whole grains, lean protein, and low-fat dairy products.  Take vitamin and mineral supplements as recommended by your health care provider.  Do not drink alcohol if:  Your health care provider tells you not to drink.  You are pregnant, may be pregnant, or are planning to become pregnant.  If you drink alcohol:  Limit how much you have to 0-1 drink a day.  Know how much alcohol is in your drink. In the [place] one drink equals one 12 oz bottle of beer (355 mL), one 5 oz glass of wine (148 mL), or one 1 oz glass of hard liquor (44 mL).  Lifestyle  Brush your teeth every morning and night with fluoride toothpaste. Floss one time each day.  Exercise for at least  30 minutes 5 or more days each week.  Do not use any products that contain nicotine or tobacco. These products include cigarettes, chewing tobacco, and vaping devices, such as e-cigarettes. If you need help quitting, ask your health care provider.  Do not use drugs.  If you are sexually active, practice safe sex. Use a condom or other form of protection to prevent STIs.  If you do not wish to become pregnant, use a form of birth control. If you plan to become pregnant, see your health care provider for a  prepregnancy visit.  Take aspirin only as told by your health care provider. Make sure that you understand how much to take and what form to take. Work with your health care provider to find out whether it is safe and beneficial for you to take aspirin daily.  Find healthy ways to manage stress, such as:  Meditation, yoga, or listening to music.  Journaling.  Talking to a trusted person.  Spending time with friends and family.  Minimize exposure to UV radiation to reduce your risk of skin cancer.  Safety  Always wear your seat belt while driving or riding in a vehicle.  Do not drive:  If you have been drinking alcohol. Do not ride with someone who has been drinking.  When you are tired or distracted.  While texting.  If you have been using any mind-altering substances or drugs.  Wear a helmet and other protective equipment during sports activities.  If you have firearms in your house, make sure you follow all gun safety procedures.  Seek help if you have been physically or sexually abused.  What's next?  Visit your health care provider once a year for an annual wellness visit.  Ask your health care provider how often you should have your eyes and teeth checked.  Stay up to date on all vaccines.  This information is not intended to replace advice given to you by your health care provider. Make sure you discuss any questions you have with your health care provider.  Document Revised: 06/27/2020 Document Reviewed: 06/27/2020  Elsevier Patient Education  2024 [employer].
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
## 2024-01-08 LAB — CYTOLOGY - PAP
Adequacy: ABSENT
Chlamydia: NEGATIVE
Comment: NEGATIVE
Comment: NEGATIVE
Comment: NEGATIVE
Comment: NEGATIVE
Comment: NORMAL
Diagnosis: UNDETERMINED — AB
HPV 16: NEGATIVE
HPV 18 / 45: NEGATIVE
High risk HPV: POSITIVE — AB
Neisseria Gonorrhea: NEGATIVE

## 2024-01-08 NOTE — Progress Notes (Signed)
 01/08/2024 - Patient originally requested delivery for 01/15/24. Delivery is not possible on this date due to ups restrictions. I have reached out to the patient and confirmed that delivery on 01/19/24 is ok.  [facility] Refill Coordination Note    Jasmine Peck, [place]: Aug 01, 1975  Phone: [phone] (home)       All above HIPAA information was verified with patient.         01/04/2024     5:02 PM   Specialty Rx Medication Refill Questionnaire   Which Medications would you like refilled and shipped? Cosentyz   Please list all current allergies: On file   Have you missed any doses in the last 30 days? No   Have you had any changes to your medication(s) since your last refill? No   How much of each medication do you have remaining at home? (eg. number of tablets, injections, etc.) None   If receiving an injectable medication, next injection date is 01/23/2024   Have you experienced any side effects in the last 30 days? No   Please enter the full address (street address, city, state, zip code) where you would like your medication(s) to be delivered to. [address] [place] [address]   Please specify on which day you would like your medication(s) to arrive. Note: if you need your medication(s) within 3 days, please call the pharmacy to schedule your order at [phone]  01/15/2024   Has your insurance changed since your last refill? No   Would you like a pharmacist to call you to discuss your medication(s)? No   Do you require a signature for your package? (Note: if we are billing Medicare Part B or your order contains a controlled substance, we will require a signature) No   I have been provided my out of pocket cost for my medication and approve the pharmacy to charge the amount to my credit card on file. Yes         Completed refill call assessment today to schedule patient's medication shipment from the [facility] [phone]).  All relevant notes have been reviewed.       Confirmed patient received a Conservation Officer, Historic Buildings and a Surveyor, Mining with first shipment. The patient will receive a drug information handout for each medication shipped and additional FDA Medication Guides as required.         REFERRAL TO PHARMACIST     Referral to the pharmacist: Not needed      [facility]     Shipping address confirmed in Epic.     Delivery Scheduled: Yes, Expected medication delivery date: 01/19/24.     Medication will be delivered via UPS to the prescription address in Epic WAM.    [name]   [facility] Specialty Technician

## 2024-01-08 NOTE — Progress Notes (Signed)
 01/08/2024 - Patient originally requested delivery for 01/15/24. Delivery is not possible on this date due to ups restrictions. I have reached out to the patient and confirmed that delivery on 01/19/24 is ok.  [facility] Refill Coordination Note    Morgan Edwards, [place]: Aug 01, 1975  Phone: [phone] (home)       All above HIPAA information was verified with patient.         01/04/2024     5:02 PM   Specialty Rx Medication Refill Questionnaire   Which Medications would you like refilled and shipped? Cosentyz   Please list all current allergies: On file   Have you missed any doses in the last 30 days? No   Have you had any changes to your medication(s) since your last refill? No   How much of each medication do you have remaining at home? (eg. number of tablets, injections, etc.) None   If receiving an injectable medication, next injection date is 01/23/2024   Have you experienced any side effects in the last 30 days? No   Please enter the full address (street address, city, state, zip code) where you would like your medication(s) to be delivered to. [address] [place] [address]   Please specify on which day you would like your medication(s) to arrive. Note: if you need your medication(s) within 3 days, please call the pharmacy to schedule your order at [phone]  01/15/2024   Has your insurance changed since your last refill? No   Would you like a pharmacist to call you to discuss your medication(s)? No   Do you require a signature for your package? (Note: if we are billing Medicare Part B or your order contains a controlled substance, we will require a signature) No   I have been provided my out of pocket cost for my medication and approve the pharmacy to charge the amount to my credit card on file. Yes         Completed refill call assessment today to schedule patient's medication shipment from the [facility] [phone]).  All relevant notes have been reviewed.       Confirmed patient received a Conservation Officer, Historic Buildings and a Surveyor, Mining with first shipment. The patient will receive a drug information handout for each medication shipped and additional FDA Medication Guides as required.         REFERRAL TO PHARMACIST     Referral to the pharmacist: Not needed      [facility]     Shipping address confirmed in Epic.     Delivery Scheduled: Yes, Expected medication delivery date: 01/19/24.     Medication will be delivered via UPS to the prescription address in Epic WAM.    Morgan Edwards   [facility] Specialty Technician

## 2024-01-11 ENCOUNTER — Ambulatory Visit: Payer: Self-pay | Admitting: Licensed Practical Nurse

## 2024-01-18 MED FILL — COSENTYX PEN 300 MG/2 PENS (150 MG/ML) SUBCUTANEOUS PEN INJECTOR: SUBCUTANEOUS | 28 days supply | Qty: 2 | Fill #1

## 2024-01-26 ENCOUNTER — Ambulatory Visit
Admission: RE | Admit: 2024-01-26 | Discharge: 2024-01-26 | Disposition: A | Source: Ambulatory Visit | Attending: Family | Admitting: Family

## 2024-01-26 ENCOUNTER — Ambulatory Visit

## 2024-01-26 DIAGNOSIS — Z1231 Encounter for screening mammogram for malignant neoplasm of breast: Secondary | ICD-10-CM

## 2024-02-03 ENCOUNTER — Encounter: Payer: Self-pay | Admitting: Obstetrics

## 2024-02-03 NOTE — Progress Notes (Unsigned)
 "  Referring Provider:  Jinnie Cookey CNM  HPI:  Jasmine Peck is a 49 y.o.  [phone]  who presents today for evaluation and management of abnormal cervical cytology.    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 12/2015:  ECC and 12:00 = negative 09/2014:  ECC = negative; CIN 1@9 :00  Prior cervical treatment(s): LEEP 05/05/2022:  (No results in Care Everywhere)  Symptoms/History:  -Abnormal vaginal discharge: *** -Postmenopausal: *** -Intermenstrual bleeding: *** -Postcoital bleeding: *** -Bleeding problems (non-gyn): *** -Contraception: *** -Number of current sexual partners: *** -Number of partners in lifetime: *** -History of a high risk partner: *** -History of STDs: *** -Smoking: *** -Gardasil Vaccine: ***      ROS:  {Ros - complete:30496}  OB History  Gravida Para Term Preterm AB Living  4 3 3  1 3   SAB IAB Ectopic Multiple Live Births  1    3    # Outcome Date GA Lbr Len/2nd Weight Sex Type Anes PTL Lv  4 Term 05/14/13 [redacted]w[redacted]d  8 lb 7.5 oz (3.841 kg)  Vag-Spont   LIV  3 Term 10/08/00 [redacted]w[redacted]d  8 lb 3 oz (3.714 kg) F Vag-Spont EPI  LIV  2 Term 07/12/92 [redacted]w[redacted]d  8 lb 1 oz (3.657 kg) F Vag-Spont   LIV  1 SAB             Past Medical History:  Diagnosis Date   Anxiety    Depression    Diabetes mellitus without complication (HCC)    GERD (gastroesophageal reflux disease)    HIV infection (HCC)    Hypertension    Psoriasis    per patient     Past Surgical History:  Procedure Laterality Date   CHOLECYSTECTOMY     DILATION AND CURETTAGE OF UTERUS     MOUTH SURGERY     all teeth have been removed     SOCIAL HISTORY:  Social History   Substance and Sexual Activity  Alcohol Use No   Alcohol/week: 0.0 standard drinks of alcohol     Social History   Substance and Sexual Activity  Drug Use No     Family History  Problem Relation Age of Onset   Heart disease Mother    Diabetes Mother    Hypertension Mother    Early death Father    Diabetes Father    Hypertension Father    Diabetes Brother    Hypertension Brother    Hyperlipidemia Brother    Heart disease Maternal Grandmother    Diabetes Maternal Grandmother    Diabetes Paternal Grandfather    Heart disease Paternal Grandfather    GER disease Daughter    Healthy Daughter    Healthy Daughter    Healthy Daughter    Asthma Daughter     ALLERGIES:  Atorvastatin , Clindamycin/lincomycin, Clindamycin/lincomycin, Erythromycin, and Erythromycin  She has a current medication list which includes the following prescription(s): accu-chek softclix lancets, alprazolam , amlodipine , amoxicillin -clavulanate, blood glucose monitoring suppl, buspirone , celecoxib , cetirizine , clobetasol  ointment, cosentyx sensoready (300 mg), dovato , duloxetine , escitalopram , fluticasone , furosemide , gabapentin , accu-chek guide test, lisinopril , omeprazole , OVER THE COUNTER MEDICATION, prazosin , rosuvastatin , rybelsus , and triamcinolone  ointment.  Physical Exam: -Vitals:  There were no vitals taken for this visit.  Physical Exam  PROCEDURE: Colposcopy performed with 4% acetic acid and Lugol's after informed consent obtained. Urine pregnancy test negative***.                           -Aceto-white Lesions Location(s): See above              -Biopsy performed at *** o'clock               -ECC indicated and performed: {yes no:314532}     -Biopsy sites made hemostatic with pressure and Monsel's solution   -Satisfactory colposcopy: {yes no:314532}    -Evidence of Invasive cervical CA :  NO  ASSESSMENT:  Jasmine Peck is a 49 y.o. [phone] with LSIL***ASCUS and HPV-HR positive*** 16/18/45 POS***NEG on recent pap (DATE) with ***, here for colposcopy today, performed as above without  complications.  -ECC and *** cervical bx sent to pathology -Aftercare instructions for home reviewed, si/sx of when to call/return discussed. -Gardasil counseling done, #1 given today *** pt to consider.  -Discussed possible outcomes based on pathology; will call with results   Estil Mangle, DO [facility] "

## 2024-02-09 ENCOUNTER — Encounter: Admitting: Obstetrics

## 2024-02-09 DIAGNOSIS — Z01812 Encounter for preprocedural laboratory examination: Secondary | ICD-10-CM

## 2024-02-09 DIAGNOSIS — R87610 Atypical squamous cells of undetermined significance on cytologic smear of cervix (ASC-US): Secondary | ICD-10-CM

## 2024-02-10 NOTE — Progress Notes (Signed)
 [facility] Refill Coordination Note    Morgan Edwards, [place]: 1975-06-20  Phone: [phone] (home)       All above HIPAA information was verified with patient.         02/10/2024     8:28 AM   Specialty Rx Medication Refill Questionnaire   Which Medications would you like refilled and shipped? Cosentzy   Please list all current allergies: On file   Have you missed any doses in the last 30 days? No   Have you had any changes to your medication(s) since your last refill? Yes   Please list your medication(s) changes below. Valsartan an hydrochlorothiazide 320/25   How much of each medication do you have remaining at home? (eg. number of tablets, injections, etc.) None   If receiving an injectable medication, next injection date is 02/21/2024   Have you experienced any side effects in the last 30 days? No   Please enter the full address (street address, city, state, zip code) where you would like your medication(s) to be delivered to. [address]    [place] [address]   Please specify on which day you would like your medication(s) to arrive. Note: if you need your medication(s) within 3 days, please call the pharmacy to schedule your order at [phone]  02/18/2024   Has your insurance changed since your last refill? No   Would you like a pharmacist to call you to discuss your medication(s)? No   Do you require a signature for your package? (Note: if we are billing Medicare Part B or your order contains a controlled substance, we will require a signature) No   I have been provided my out of pocket cost for my medication and approve the pharmacy to charge the amount to my credit card on file. Yes         Completed refill call assessment today to schedule patient's medication shipment from the [facility] [phone]).  All relevant notes have been reviewed.       Confirmed patient received a Conservation Officer, Historic Buildings and a Surveyor, Mining with first shipment. The patient will receive a drug information handout for each medication shipped and additional FDA Medication Guides as required.         REFERRAL TO PHARMACIST     Referral to the pharmacist: Not needed      [facility]     Shipping address confirmed in Epic.     Delivery Scheduled: Yes, Expected medication delivery date: 02/18/2024.     Medication will be delivered via UPS to the prescription address in Epic WAM.    Lucie CHRISTELLA Forts   [facility] Specialty Technician

## 2024-02-11 NOTE — Progress Notes (Unsigned)
 "  Referring Provider:  Jinnie Cookey CNM  HPI:  Jasmine Peck is a 50 y.o.  [phone]  who presents today for evaluation and management of abnormal cervical cytology.    Prior pap smears:  01/04/24: ASCUS HPV + (16, 18/45 neg) 02/2021:  NILM HPV + 02/2018: normal per patient 10/2015:  ASC-H, HPV + 07/2014:  ASC-H HPV + (neg 16 and 18)  Prior cervical / vaginal findings: Colposcopy 05/05/2022:  Colpo of cervix and upper adjacent vagina - no results available in EPIC (done at [facility] under anesthesia.  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 12/2015:  ECC and 12:00 = negative 09/2014:  ECC = negative; CIN 1@9 :00  Prior cervical treatment(s): LEEP 02/20/2023:  @ [facility], no results in EPIC  Symptoms/History:  -Abnormal vaginal discharge: *** -Postmenopausal: *** -Intermenstrual bleeding: *** -Postcoital bleeding: *** -Bleeding problems (non-gyn): *** -Contraception: *** -Number of current sexual partners: *** -Number of partners in lifetime: *** -History of a high risk partner: *** -History of STDs: HIV -Smoking: *** -Gardasil Vaccine: ***      ROS:  {Ros - complete:30496}  OB History  Gravida Para Term Preterm AB Living  4 3 3  1 3   SAB IAB Ectopic Multiple Live Births  1    3    # Outcome Date GA Lbr Len/2nd Weight Sex Type Anes PTL Lv  4 Term 05/14/13 [redacted]w[redacted]d  8 lb 7.5 oz (3.841 kg)  Vag-Spont   LIV  3 Term 10/08/00 [redacted]w[redacted]d  8 lb 3 oz (3.714 kg) F Vag-Spont EPI  LIV  2 Term 07/12/92 [redacted]w[redacted]d  8 lb 1 oz (3.657 kg) F Vag-Spont   LIV  1 SAB             Past Medical History:  Diagnosis Date   Anxiety    Depression    Diabetes mellitus without complication (HCC)    GERD (gastroesophageal reflux disease)    HIV infection (HCC)    Hypertension    Psoriasis    per patient     Past Surgical History:  Procedure Laterality Date   CHOLECYSTECTOMY     DILATION AND CURETTAGE OF UTERUS     MOUTH  SURGERY     all teeth have been removed     SOCIAL HISTORY:  Social History   Substance and Sexual Activity  Alcohol Use No   Alcohol/week: 0.0 standard drinks of alcohol    Social History   Substance and Sexual Activity  Drug Use No     Family History  Problem Relation Age of Onset   Heart disease Mother    Diabetes Mother    Hypertension Mother    Early death Father    Diabetes Father    Hypertension Father    Diabetes Brother    Hypertension Brother    Hyperlipidemia Brother    Heart disease Maternal Grandmother    Diabetes Maternal Grandmother    Diabetes Paternal Grandfather    Heart disease Paternal Grandfather    GER disease Daughter    Healthy Daughter    Healthy Daughter    Healthy Daughter    Asthma Daughter     ALLERGIES:  Atorvastatin , Clindamycin/lincomycin, Clindamycin/lincomycin, Erythromycin, and Erythromycin  She has a current medication list which includes the following prescription(s): accu-chek softclix lancets, alprazolam , amlodipine , amoxicillin -clavulanate, blood glucose monitoring suppl, buspirone , celecoxib , cetirizine , clobetasol  ointment, cosentyx sensoready (300 mg), dovato , duloxetine , escitalopram ,  fluticasone , furosemide , gabapentin , accu-chek guide test, lisinopril , omeprazole , OVER THE COUNTER MEDICATION, prazosin , rosuvastatin , rybelsus , and triamcinolone  ointment.  Physical Exam: -Vitals:  There were no vitals taken for this visit.  Physical Exam   PROCEDURE: Colposcopy performed with 4% acetic acid and Lugol's after informed consent obtained. Urine pregnancy test negative***.                           -Aceto-white Lesions Location(s): See above              -Biopsy performed at *** o'clock               -ECC indicated and performed: {yes no:314532}     -Biopsy sites made hemostatic with pressure and Monsel's solution   -Satisfactory colposcopy: {yes no:314532}    -Evidence of Invasive cervical CA :  NO  ASSESSMENT:   Jasmine Peck is a 49 y.o. [phone] with LSIL***ASCUS and HPV-HR positive*** 16/18/45 POS***NEG on recent pap (DATE) with ***, here for colposcopy today, performed as above without complications.  -ECC and *** cervical bx sent to pathology -Aftercare instructions for home reviewed, si/sx of when to call/return discussed. -Gardasil counseling done, #1 given today *** pt to consider.  -Discussed possible outcomes based on pathology; will call with results   Estil Mangle, DO  [facility] "

## 2024-02-15 ENCOUNTER — Encounter: Admitting: Obstetrics

## 2024-02-15 DIAGNOSIS — Z01812 Encounter for preprocedural laboratory examination: Secondary | ICD-10-CM

## 2024-02-15 DIAGNOSIS — R87610 Atypical squamous cells of undetermined significance on cytologic smear of cervix (ASC-US): Secondary | ICD-10-CM

## 2024-02-17 MED FILL — COSENTYX PEN 300 MG/2 PENS (150 MG/ML) SUBCUTANEOUS PEN INJECTOR: SUBCUTANEOUS | 28 days supply | Qty: 2 | Fill #2

## 2024-03-01 ENCOUNTER — Ambulatory Visit: Admitting: Family

## 2024-03-23 ENCOUNTER — Encounter: Admitting: Obstetrics

## 2024-11-02 ENCOUNTER — Inpatient Hospital Stay

## 2024-11-09 ENCOUNTER — Inpatient Hospital Stay: Admitting: Physician Assistant
